# Patient Record
Sex: Female | Born: 1950 | Race: White | Hispanic: No | Marital: Married | Smoking: Former smoker
Health system: Southern US, Community
[De-identification: ages and names within clinical notes are randomized; demographics above are authoritative.]

## PROBLEM LIST (undated history)

## (undated) DIAGNOSIS — F419 Anxiety disorder, unspecified: Secondary | ICD-10-CM

## (undated) DIAGNOSIS — Z87442 Personal history of urinary calculi: Secondary | ICD-10-CM

## (undated) DIAGNOSIS — T8859XA Other complications of anesthesia, initial encounter: Secondary | ICD-10-CM

## (undated) DIAGNOSIS — IMO0001 Reserved for inherently not codable concepts without codable children: Secondary | ICD-10-CM

## (undated) DIAGNOSIS — E7800 Pure hypercholesterolemia, unspecified: Secondary | ICD-10-CM

## (undated) DIAGNOSIS — Z96659 Presence of unspecified artificial knee joint: Secondary | ICD-10-CM

## (undated) DIAGNOSIS — I10 Essential (primary) hypertension: Secondary | ICD-10-CM

## (undated) DIAGNOSIS — M1990 Unspecified osteoarthritis, unspecified site: Secondary | ICD-10-CM

## (undated) DIAGNOSIS — T4145XA Adverse effect of unspecified anesthetic, initial encounter: Secondary | ICD-10-CM

## (undated) DIAGNOSIS — G5603 Carpal tunnel syndrome, bilateral upper limbs: Secondary | ICD-10-CM

## (undated) HISTORY — DX: Pure hypercholesterolemia, unspecified: E78.00

## (undated) HISTORY — PX: APPENDECTOMY: SHX54

## (undated) HISTORY — PX: BREAST CYST EXCISION: SHX579

## (undated) HISTORY — PX: KNEE SURGERY: SHX244

## (undated) HISTORY — PX: LAMINECTOMY: SHX219

## (undated) HISTORY — DX: Unspecified osteoarthritis, unspecified site: M19.90

## (undated) SURGERY — CARPAL TUNNEL RELEASE
Anesthesia: Choice | Laterality: Left

---

## 2002-04-03 ENCOUNTER — Ambulatory Visit (HOSPITAL_COMMUNITY): Admission: RE | Admit: 2002-04-03 | Discharge: 2002-04-03 | Payer: Self-pay | Admitting: Obstetrics and Gynecology

## 2002-04-03 ENCOUNTER — Encounter: Payer: Self-pay | Admitting: Obstetrics and Gynecology

## 2003-03-28 ENCOUNTER — Emergency Department (HOSPITAL_COMMUNITY): Admission: EM | Admit: 2003-03-28 | Discharge: 2003-03-29 | Payer: Self-pay | Admitting: Emergency Medicine

## 2003-03-28 ENCOUNTER — Encounter: Payer: Self-pay | Admitting: Emergency Medicine

## 2003-04-07 ENCOUNTER — Inpatient Hospital Stay (HOSPITAL_COMMUNITY): Admission: AD | Admit: 2003-04-07 | Discharge: 2003-04-11 | Payer: Self-pay | Admitting: Family Medicine

## 2003-07-04 ENCOUNTER — Ambulatory Visit (HOSPITAL_COMMUNITY): Admission: RE | Admit: 2003-07-04 | Discharge: 2003-07-04 | Payer: Self-pay | Admitting: Obstetrics and Gynecology

## 2003-07-04 ENCOUNTER — Ambulatory Visit (HOSPITAL_COMMUNITY): Admission: RE | Admit: 2003-07-04 | Discharge: 2003-07-04 | Payer: Self-pay | Admitting: Family Medicine

## 2004-01-05 ENCOUNTER — Other Ambulatory Visit: Admission: RE | Admit: 2004-01-05 | Discharge: 2004-01-05 | Payer: Self-pay | Admitting: Obstetrics and Gynecology

## 2005-01-11 ENCOUNTER — Emergency Department (HOSPITAL_COMMUNITY): Admission: EM | Admit: 2005-01-11 | Discharge: 2005-01-11 | Payer: Self-pay | Admitting: Emergency Medicine

## 2005-04-25 ENCOUNTER — Ambulatory Visit (HOSPITAL_COMMUNITY): Admission: RE | Admit: 2005-04-25 | Discharge: 2005-04-25 | Payer: Self-pay | Admitting: Obstetrics and Gynecology

## 2005-07-26 ENCOUNTER — Ambulatory Visit (HOSPITAL_COMMUNITY): Admission: RE | Admit: 2005-07-26 | Discharge: 2005-07-26 | Payer: Self-pay | Admitting: Family Medicine

## 2005-09-12 ENCOUNTER — Encounter (HOSPITAL_COMMUNITY): Admission: RE | Admit: 2005-09-12 | Discharge: 2005-10-12 | Payer: Self-pay | Admitting: Neurosurgery

## 2005-09-12 ENCOUNTER — Ambulatory Visit: Payer: Self-pay | Admitting: Orthopedic Surgery

## 2005-10-13 ENCOUNTER — Encounter (HOSPITAL_COMMUNITY): Admission: RE | Admit: 2005-10-13 | Discharge: 2005-11-12 | Payer: Self-pay | Admitting: Neurosurgery

## 2006-04-27 ENCOUNTER — Ambulatory Visit (HOSPITAL_COMMUNITY): Admission: RE | Admit: 2006-04-27 | Discharge: 2006-04-27 | Payer: Self-pay | Admitting: Obstetrics and Gynecology

## 2006-07-25 HISTORY — PX: JOINT REPLACEMENT: SHX530

## 2007-01-03 ENCOUNTER — Ambulatory Visit: Payer: Self-pay | Admitting: Orthopedic Surgery

## 2007-01-12 ENCOUNTER — Ambulatory Visit (HOSPITAL_COMMUNITY): Admission: RE | Admit: 2007-01-12 | Discharge: 2007-01-12 | Payer: Self-pay | Admitting: Orthopedic Surgery

## 2007-01-18 ENCOUNTER — Ambulatory Visit: Payer: Self-pay | Admitting: Orthopedic Surgery

## 2007-01-22 ENCOUNTER — Encounter: Admission: RE | Admit: 2007-01-22 | Discharge: 2007-01-22 | Payer: Self-pay | Admitting: Orthopedic Surgery

## 2007-02-05 ENCOUNTER — Ambulatory Visit: Payer: Self-pay | Admitting: Orthopedic Surgery

## 2007-02-13 ENCOUNTER — Encounter: Admission: RE | Admit: 2007-02-13 | Discharge: 2007-02-13 | Payer: Self-pay | Admitting: Orthopedic Surgery

## 2007-02-27 ENCOUNTER — Encounter: Admission: RE | Admit: 2007-02-27 | Discharge: 2007-02-27 | Payer: Self-pay | Admitting: Orthopedic Surgery

## 2007-03-13 ENCOUNTER — Inpatient Hospital Stay (HOSPITAL_COMMUNITY): Admission: RE | Admit: 2007-03-13 | Discharge: 2007-03-16 | Payer: Self-pay | Admitting: Orthopedic Surgery

## 2007-03-13 ENCOUNTER — Ambulatory Visit: Payer: Self-pay | Admitting: Orthopedic Surgery

## 2007-03-13 ENCOUNTER — Encounter: Payer: Self-pay | Admitting: Orthopedic Surgery

## 2007-03-27 ENCOUNTER — Ambulatory Visit: Payer: Self-pay | Admitting: Orthopedic Surgery

## 2007-04-30 ENCOUNTER — Encounter (HOSPITAL_COMMUNITY): Admission: RE | Admit: 2007-04-30 | Discharge: 2007-05-30 | Payer: Self-pay | Admitting: Orthopedic Surgery

## 2007-05-01 ENCOUNTER — Ambulatory Visit (HOSPITAL_COMMUNITY): Admission: RE | Admit: 2007-05-01 | Discharge: 2007-05-01 | Payer: Self-pay | Admitting: Obstetrics and Gynecology

## 2007-05-03 ENCOUNTER — Ambulatory Visit: Payer: Self-pay | Admitting: Orthopedic Surgery

## 2007-05-03 DIAGNOSIS — M1710 Unilateral primary osteoarthritis, unspecified knee: Secondary | ICD-10-CM

## 2007-05-11 ENCOUNTER — Other Ambulatory Visit: Admission: RE | Admit: 2007-05-11 | Discharge: 2007-05-11 | Payer: Self-pay | Admitting: Obstetrics and Gynecology

## 2007-05-30 ENCOUNTER — Encounter: Payer: Self-pay | Admitting: Orthopedic Surgery

## 2007-05-31 ENCOUNTER — Ambulatory Visit: Payer: Self-pay | Admitting: Orthopedic Surgery

## 2007-05-31 DIAGNOSIS — M25569 Pain in unspecified knee: Secondary | ICD-10-CM

## 2007-06-01 ENCOUNTER — Encounter (HOSPITAL_COMMUNITY): Admission: RE | Admit: 2007-06-01 | Discharge: 2007-07-01 | Payer: Self-pay | Admitting: Orthopedic Surgery

## 2007-06-20 ENCOUNTER — Encounter: Payer: Self-pay | Admitting: Orthopedic Surgery

## 2007-07-05 ENCOUNTER — Encounter: Payer: Self-pay | Admitting: Orthopedic Surgery

## 2007-07-10 ENCOUNTER — Ambulatory Visit: Payer: Self-pay | Admitting: Orthopedic Surgery

## 2007-07-10 ENCOUNTER — Inpatient Hospital Stay (HOSPITAL_COMMUNITY): Admission: RE | Admit: 2007-07-10 | Discharge: 2007-07-13 | Payer: Self-pay | Admitting: Orthopedic Surgery

## 2007-07-13 ENCOUNTER — Encounter: Payer: Self-pay | Admitting: Orthopedic Surgery

## 2007-07-16 ENCOUNTER — Encounter: Payer: Self-pay | Admitting: Orthopedic Surgery

## 2007-07-23 ENCOUNTER — Telehealth: Payer: Self-pay | Admitting: Orthopedic Surgery

## 2007-07-23 ENCOUNTER — Encounter: Payer: Self-pay | Admitting: Orthopedic Surgery

## 2007-07-30 ENCOUNTER — Ambulatory Visit: Payer: Self-pay | Admitting: Orthopedic Surgery

## 2007-08-01 ENCOUNTER — Encounter (HOSPITAL_COMMUNITY): Admission: RE | Admit: 2007-08-01 | Discharge: 2007-08-31 | Payer: Self-pay | Admitting: Orthopedic Surgery

## 2007-08-02 ENCOUNTER — Encounter: Payer: Self-pay | Admitting: Orthopedic Surgery

## 2007-08-06 ENCOUNTER — Encounter: Payer: Self-pay | Admitting: Orthopedic Surgery

## 2007-08-23 ENCOUNTER — Encounter: Payer: Self-pay | Admitting: Orthopedic Surgery

## 2007-08-27 ENCOUNTER — Encounter: Payer: Self-pay | Admitting: Orthopedic Surgery

## 2007-08-30 ENCOUNTER — Ambulatory Visit: Payer: Self-pay | Admitting: Orthopedic Surgery

## 2007-09-01 ENCOUNTER — Encounter: Payer: Self-pay | Admitting: Orthopedic Surgery

## 2007-09-03 ENCOUNTER — Encounter (HOSPITAL_COMMUNITY): Admission: RE | Admit: 2007-09-03 | Discharge: 2007-10-03 | Payer: Self-pay | Admitting: Orthopedic Surgery

## 2007-09-12 ENCOUNTER — Encounter: Payer: Self-pay | Admitting: Orthopedic Surgery

## 2007-10-11 ENCOUNTER — Ambulatory Visit: Payer: Self-pay | Admitting: Orthopedic Surgery

## 2007-10-11 DIAGNOSIS — M7580 Other shoulder lesions, unspecified shoulder: Secondary | ICD-10-CM

## 2008-01-18 ENCOUNTER — Encounter: Payer: Self-pay | Admitting: Orthopedic Surgery

## 2008-03-19 ENCOUNTER — Ambulatory Visit: Payer: Self-pay | Admitting: Orthopedic Surgery

## 2008-05-02 ENCOUNTER — Ambulatory Visit (HOSPITAL_COMMUNITY): Admission: RE | Admit: 2008-05-02 | Discharge: 2008-05-02 | Payer: Self-pay | Admitting: Obstetrics and Gynecology

## 2008-05-23 ENCOUNTER — Other Ambulatory Visit: Admission: RE | Admit: 2008-05-23 | Discharge: 2008-05-23 | Payer: Self-pay | Admitting: Obstetrics and Gynecology

## 2008-07-14 ENCOUNTER — Ambulatory Visit: Payer: Self-pay | Admitting: Orthopedic Surgery

## 2009-01-07 ENCOUNTER — Ambulatory Visit: Payer: Self-pay | Admitting: Orthopedic Surgery

## 2009-05-04 ENCOUNTER — Ambulatory Visit (HOSPITAL_COMMUNITY): Admission: RE | Admit: 2009-05-04 | Discharge: 2009-05-04 | Payer: Self-pay | Admitting: Obstetrics and Gynecology

## 2009-07-08 ENCOUNTER — Ambulatory Visit: Payer: Self-pay | Admitting: Orthopedic Surgery

## 2009-09-25 ENCOUNTER — Other Ambulatory Visit: Admission: RE | Admit: 2009-09-25 | Discharge: 2009-09-25 | Payer: Self-pay | Admitting: Obstetrics and Gynecology

## 2010-05-07 ENCOUNTER — Ambulatory Visit (HOSPITAL_COMMUNITY): Admission: RE | Admit: 2010-05-07 | Discharge: 2010-05-07 | Payer: Self-pay | Admitting: Obstetrics and Gynecology

## 2010-08-04 ENCOUNTER — Ambulatory Visit
Admission: RE | Admit: 2010-08-04 | Discharge: 2010-08-04 | Payer: Self-pay | Source: Home / Self Care | Attending: Orthopedic Surgery | Admitting: Orthopedic Surgery

## 2010-08-04 DIAGNOSIS — Z96659 Presence of unspecified artificial knee joint: Secondary | ICD-10-CM | POA: Insufficient documentation

## 2010-08-26 NOTE — Assessment & Plan Note (Signed)
Summary: yrly xr both knees TKA/mcr/bsf   Visit Type:  Follow-up  CC:  recheck bilateral knee TKA's.  History of Present Illness: I saw [name] in the office today for a  followup visit.  She is a 60 years old woman with the complaint of:  Yearly recheck with xrays bilateral knees.  LEFT TKA 08/08.  RIGHT  TKA 12/08  s/p bilateral total knees, here for her yearly followup radiographs at 5 years. Her legs do feel heavy at times, but she is doing very well. Only has difficulty with changes in the weather.  No pain med needed.  System review: Chest pain; History of cardiac disease. Advised his cardiologist immediately      Allergies: No Known Drug Allergies  Physical Exam  Additional Exam:  Gen. exam normal. Patient maintaining excellent body habitus, and body weight.  She is awake, alert, and oriented x3. Mood and affect are normal. She walks with an unsupported gait.  BILATERAL KNEE EXAM   FLEXION = 120 DEGREES IN BOTH SHE HAS FULLE XTENSION  BOTH ARE STABLE    Impression & Recommendations:  Problem # 1:  OSTEOARTHRITIS, LOWER LEG (ICD-715.16) Assessment Comment Only Separate and Identifiable X-Ray report      Followup films for bilateral knee replacements.  RIGHT knee The  alignment was normal, PTF reduced without tilt or subluxation, no evidence of loosening.  IMPRESSION: normal appearance of the implant   LEFT knee The  alignment was normal, PTF reduced without tilt or subluxation, no evidence of loosening.  IMPRESSION: normal appearance of the implant   Problem # 2:  TOTAL KNEE FOLLOW-UP (ICD-V43.65) Assessment: Comment Only  Orders: Est. Patient Level III (47425) Knees  x-ray bilateral ([id number])  Medications Added to Medication List This Visit: 1)  Simvastatin 40 Mg Tabs (Simvastatin)  Patient Instructions: 1)  Please schedule a follow-up appointment in 1 year. 2)  BILATERAL TKA XRAYS    Orders Added: 1)  Est. Patient Level III  [43329] 2)  Knees  x-ray bilateral [[id number]]

## 2010-09-01 ENCOUNTER — Ambulatory Visit (HOSPITAL_COMMUNITY)
Admission: RE | Admit: 2010-09-01 | Discharge: 2010-09-01 | Disposition: A | Discharge status: Home / Self Care | Payer: Medicare Other | Source: Ambulatory Visit | Attending: Family Medicine | Admitting: Family Medicine

## 2010-09-01 ENCOUNTER — Other Ambulatory Visit (HOSPITAL_COMMUNITY): Payer: Self-pay | Admitting: Family Medicine

## 2010-09-01 DIAGNOSIS — F17200 Nicotine dependence, unspecified, uncomplicated: Secondary | ICD-10-CM

## 2010-09-01 DIAGNOSIS — M542 Cervicalgia: Secondary | ICD-10-CM | POA: Insufficient documentation

## 2010-09-01 DIAGNOSIS — M25519 Pain in unspecified shoulder: Secondary | ICD-10-CM

## 2010-09-01 DIAGNOSIS — M25551 Pain in right hip: Secondary | ICD-10-CM

## 2010-09-01 DIAGNOSIS — M47812 Spondylosis without myelopathy or radiculopathy, cervical region: Secondary | ICD-10-CM | POA: Insufficient documentation

## 2010-09-01 DIAGNOSIS — M25559 Pain in unspecified hip: Secondary | ICD-10-CM | POA: Insufficient documentation

## 2010-09-01 DIAGNOSIS — R0789 Other chest pain: Secondary | ICD-10-CM | POA: Insufficient documentation

## 2010-12-07 NOTE — Op Note (Signed)
[name], [name]               ACCOUNT NO.:  [id number]   MEDICAL RECORD NO.:  [id number]          PATIENT TYPE:  INP   LOCATION:  A302                          FACILITY:  [facility]   PHYSICIAN:  Vickki Hearing, M.D.DATE OF BIRTH:  [birth year]   DATE OF PROCEDURE:  07/10/2007  DATE OF DISCHARGE:                               OPERATIVE REPORT   HISTORY:  A 60 year old female with end-stage osteoarthritis, right  knee; symptomatic medial compartment; uncontrolled pain despite  nonoperative treatment.  X-rays and physical exam and history matched,  and the patient requested total knee replacement on the right status  post total knee replacement on the left.   PREOPERATIVE DIAGNOSIS:  Osteoarthritis, right knee.   POSTOPERATIVE DIAGNOSIS:  Osteoarthritis, right knee.   PROCEDURE:  Right total knee arthroplasty.   IMPLANT:  DePuy size 4 femur, size 3 tibia, size 12 posterior stabilized  cross-link polyethylene insert, size 38 patella (Sigma posterior  stabilized fixed bearing implant).   SURGEON:  Dr. Romeo Apple assisted by Valetta Close.   ANESTHETIC:  Spinal.   OPERATIVE FINDINGS:  The medial compartment was the compartment  primarily involved in the disease.  Medial femoral condyle had complete  loss of cartilage.  There was varus deformity and slight flexion  contracture.   Procedure was done as follows.  The patient was first identified in the  preoperative holding area as [name].  Her right knee had was  marked by her as the surgical site, and then I countersigned it.  The  history and physical was reviewed, the consent was reviewed and the  physical exam update was completed.   The patient was given spinal anesthetic in the operating suite, and  antibiotics were then started.  Catheter was inserted, tourniquet was  placed on the right proximal thigh, the knee was prepped with DuraPrep  and then draped sterilely.  The time-out procedure was completed, and  the  procedure was confirmed on the on the right knee.  X-rays were in  the room and plans confirmed as well.   The tourniquet was inflated after exsanguination of the limb with a 6-  inch Esmarch with the knee in flexion.  With the knee in flexion, a  straight midline incision was done.  Subcutaneous tissue was divided.  The extensor mechanism was exposed.  The medial arthrotomy was  performed.  Soft tissue dissection was carried out medially to the mid  coronal plane of the tibia.  The ACL, PCL, medial and lateral menisci  were resected.  The patellofemoral ligament was released.  The tibia was  subluxated forward, and the tibial guide was set for neutral cut.  We  referenced a lateral tibial plateau for an 10-mm cut, and this was  performed with an oscillating saw.  The remaining tibia was measured to  be a size 3 tibia.   A drill bit was then introduced into the intramedullary canal of the  femur, and then suction and irrigation were used to remove fatty and  bloody tissue from the canal.  An intramedullary guide system was placed  and the femoral  canal was set for a 5-degree right cut.  We used the 10-  mm resection setting and pinned the distal femoral cutting block in  place.  We removed 10 mm of bone, checked the cut for flatness and then  measured the extension gap.  A 10 and 12.5 extension block was tested.  The 12.5 seemed to fit best.   The femur was then measured to a size 4, was set for 3 degrees external  rotation.  The 4 cuts were then made.  The flexion gap was checked.  The  12.5 block fit well, and we proceeded to make the box cut.  After the  box cut was made, we sized the patella and cut it from a 21 to a 12 and  sized it for a 8.5-mm thickness patella.  We drilled the 3 peg holes.   We then did a trial reduction with the implants in place, 12 posterior  stabilized polyethylene tray.  We obtained excellent tracking, alignment  and ligament stability in the AP and  coronal planes.   We set the rotation of the tibia and then made our initial drill hole in  the canal and then punched the proximal tibia.   We then irrigated the joint, dried the bone, cemented our implants in  place.  The cement cured, the excess cement was removed, 12.5 posterior  stabilized trial tray was placed.  The knee was taken through a range of  motion, and it was found to be stable with good patella tracking.  The  trial was removed.  The tray liner was removed, and a 12 posterior  stabilized cross-link polyethylene was inserted and secured.   The wounds were irrigated again.  The femur was reduced onto the tibia.  We injected 30 mL of Marcaine with epinephrine into the soft tissues of  the joint.  We closed the knee in flexion with #1 interrupted and  running Bralon suture.   We then inserted a pain pump catheter in the subcutaneous space and  reapproximated the subcutaneous tissue with 0 Monocryl.  Skin staples  were applied to reapproximate the skin.  A sterile dressing was applied.  The pain pump catheter was activated.  CryoCuff was applied over Ace  bandages which were applied from the foot to the groin, and the patient  was taken to the recovery room in stable condition where she was placed  in a CPM machine, and intraoperative x-ray confirmed alignment of the  prosthesis, and there were no complicating features.      Vickki Hearing, M.D.  Electronically Signed     SEH/MEDQ  D:  07/10/2007  T:  07/10/2007  Job:  [id number]

## 2010-12-07 NOTE — Discharge Summary (Signed)
NAMEATIANA, LEVIER               ACCOUNT NO.:  [id number]   MEDICAL RECORD NO.:  [id number]          PATIENT TYPE:  INP   LOCATION:  A304                          FACILITY:  [facility]   PHYSICIAN:  Vickki Hearing, M.D.DATE OF BIRTH:  04-04-1951   DATE OF ADMISSION:  03/13/2007  DATE OF DISCHARGE:  08/22/2008LH                               DISCHARGE SUMMARY   ADMITTING DIAGNOSES:  1. Osteoarthritis, left knee.  2. Comorbidities.  3. Degenerative disk disease, lumbar spine.  4. Nonsurgical herniated disk, L4-L5.   MEDICAL HISTORY:  This is a 60 year old female who had increasing left  knee pain over a year.  Pain became intolerable and was unrelieved by  Lorcet Plus, 1/2 tablet four times a day.  She was treated for back pain  with epidural injection, which did not relieve any of her knee pain.  She was evaluated by Dr. Newell Coral, who did not think her L4-5 disk  needed any surgery.  After informed consent, process completed in the  office.  The patient opted for surgical treatment with a total knee  replacement.   She was admitted on March 13, 2007.  She underwent an uncomplicated  left total knee replacement, with a DePuy cruciate substituting, fixed  bearing PFC Sigma knee replacement.  We used a size 3 femur, size 35-mm  patella, size 3 tibia, 15-mm cross-link stabilized insert and Smart set  bone cement.   The patient did well in therapy postoperatively, had no complications.   OPERATIVE FINDINGS:  There was complete loss of cartilage from the  medial compartment with mild varus deformity, mild flexion contracture  less than 5 degrees.  There was a single chondral lesion on the lateral  condyle, and there was a full-thickness lesion on the patella.   Past specimen came back as degenerated articular cartilage, with  findings consistent with osteoarthritis.   LABORATORY STUDIES:  Hemoglobin at discharge was 11.5, and her metabolic  7 was normal, with a Glucose of  111.   DISCHARGE MEDICATIONS:  She was discharged on following medications:  1. Coumadin 5 mg daily, 8 days.  2. Robaxin 500 mg q.8 p.r.n.  3. Percocet 5/325 q.4 p.r.n.  4. She was to resume Xanax 1 mg b.i.d.  5. Femhrt 0.5 mg daily.   Follow-up visit was arranged, to take staples out within the next week.  Overall condition at discharge stable.      Vickki Hearing, M.D.  Electronically Signed     SEH/MEDQ  D:  04/17/2007  T:  04/17/2007  Job:  [id number]

## 2010-12-07 NOTE — Op Note (Signed)
[name], [name]               ACCOUNT NO.:  [id number]   MEDICAL RECORD NO.:  [id number]          PATIENT TYPE:  INP   LOCATION:  A304                          FACILITY:  [facility]   PHYSICIAN:  Vickki Hearing, M.D.DATE OF BIRTH:  [birth year]   DATE OF PROCEDURE:  03/13/2007  DATE OF DISCHARGE:                               OPERATIVE REPORT   PREOPERATIVE DIAGNOSIS:  Osteoarthritis left knee.   POSTOPERATIVE DIAGNOSIS:  Osteoarthritis left knee.   PROCEDURE:  Left total knee replacement.   IMPLANTS:  DePuy fixed bearing total knee and 3 femur, 3 tibia 15  polyethylene fixed bearing insert, 35 mm patella.   OPERATIVE FINDINGS:  Complete loss of cartilage from the medial  compartment, mild varus deformity, mild flexion contracture less than 5  degrees each, single chondral lesion full-thickness lateral femoral  condyle and mid patella full thickness lesion was noted.   SURGEON:  Vickki Hearing, M.D.   ASSISTANT:  Deniece Portela McFatter and [employer].   ANESTHETIC:  Spinal.   BLOOD LOSS:  None.  The surgery was done under tourniquet control at 300  mmHg.  Time found in anesthesia report.   The patient was identified as [name].  We marked her left knee as  the surgical site.  I countersigned it, updated her history and  physical, took her back to surgery where she had spinal anesthetic.  Antibiotics were started.  She was placed supine on the operating table.  Foley catheter was inserted.  The left leg was prepped with DuraPrep and  drops or draped sterilely.  Time-out procedure was completed, procedure  confirmed, patient identity confirmed, site surgery confirmed.   The tourniquet was elevated after exsanguination of the limb.  Straight  incision was made over the patella, extended proximally and distally  down to the extensor mechanism, medial arthrotomy was performed, patella  was everted, patellofemoral ligament was released, medial soft tissue  sleeve was  released up to the level of the mid coronal plane of the  tibia.  ACL, PCL, medial and lateral menisci were resected.  Tibia was  subluxated forward.  Tibial guide was set for neutral position with 0  posterior slope.  We referenced from the lateral side, a 10 mm cut, made  the cut, measured the tibia to a size 3.   We drilled the center of the trochlea into the femoral canal,  decompressed it with suction and irrigation, set the distal femoral cut  for a 10 mm cut, made this cut, sized the femur to 3.5, set it for a 3,  cut for a 3, with the four-in-one cutting block.  We measured the  flexion/extension gaps, found them to be approximately 12.5 and gaps  were equal.  Femoral cuts were made with 3 degree external rotation.   After doing a trial with a 3 femur, 3 tibia, 12.5 insert, lug holes were  drilled.  The patella was cut from a 22 down to a 13.  Three peg holes  for the patellar insert using a 35 template were made.  Another trial  reduction was done.  No lateral release was needed.  Tibial rotation was  set.  Tibial punches were made.  Knee was then irrigated and the  components cemented in place.  Excess cement was removed.  Cement was  allowed to cure.  The 12.5 and 15 mm inserts were trialed, I liked the  15 best.  We put the 15 polyethylene insert in.  We checked the knee.  We had full extension and 115 degrees of flexion with no assistants,  i.e., passive flexion test and a 125 degrees with surgeon pressure on  the knee.   The knee was irrigated, closed after injection of 30 mL of Sensorcaine  with epinephrine.  We used #1 Bralon to close in interrupted and running  fashion.  We used 2-0 and 0 to close the subcu layer over a pain pump  catheter which was activated.  Staples were used to close the skin.  Sterile dressings and cryo cuff.  The patient taken to recovery room,  placed in CPM machine in stable condition.  Intraoperative film was  taken just after closure.  Film  showed good alignment, no complicating  features.   POSTOPERATIVE PLAN:  The patient will have routine postop total knee  protocol with Coumadin as a DVT prevention, immediate CPM.      Vickki Hearing, M.D.  Electronically Signed     SEH/MEDQ  D:  03/13/2007  T:  03/13/2007  Job:  [id number]

## 2010-12-07 NOTE — Discharge Summary (Signed)
NAMEKIMMERLY, [name]               ACCOUNT NO.:  [id number]   MEDICAL RECORD NO.:  [id number]          PATIENT TYPE:  INP   LOCATION:  A302                          FACILITY:  [facility]   PHYSICIAN:  Vickki Hearing, M.D.DATE OF BIRTH:  [birth year]   DATE OF ADMISSION:  07/10/2007  DATE OF DISCHARGE:  12/19/2008LH                               DISCHARGE SUMMARY   ADMITTING DIAGNOSIS:  Pain, right knee with osteoarthritis.   DISCHARGE DIAGNOSES:  Pain, right knee with osteoarthritis.   OPERATIVE PROCEDURE:  Right total knee arthroplasty.   OPERATIVE SURGEON:  Dr. Romeo Apple.   DISCHARGE MEDICATIONS:  1. Coumadin 3 mg daily for 7 days.  2. Robaxin 500 mg p.o. q.6 p.r.n. spasms.  3. Fem-Heart daily, resume pre-hospital dose.  4. Oxycodone 5/325, one q.4 p.r.n. for pain.  S  5. Simvastatin 20 mg h.s.  6. Xanax 0.5 mg at q.h.s. as needed.  7. Celebrex 200 mg a day, to start on Wednesday after discharge.   HISTORY:  A 60 year old female, status post left total knee had  bilateral osteoarthritis with matching x-rays, presented for right total  knee replacement due to end-stage osteoarthritis and uncontrollable  pain.   The patient was admitted as stated, did well during the surgical  procedure, as well as the post-op period.   She had a PFC Sigma across link stabilized, posterior stabilized 12.5-mm  insert, cruciate substituting femoral component size 4, right; A PFC  Sigma modular tibial tray cemented, smart set HV bone cement, a striker  pain pump, and an oval dome patella, size 38.   HOSPITAL COURSE:  Initially, we has some trouble controlling her pain,  eventually managed that well with Tylox, Robaxin, and PCA.  She did well  with her therapeutic exercises, including a CPM machine.  She walked  over 300 feet, had a range of motion at discharge of 8 to 93 degrees  active assisted.   DISCHARGE INSTRUCTIONS:  Full weightbearing.  She is to return to the  office for evaluation  within the next 2 weeks.  She should have a CPM  machine at home, home physical therapy and continue her post-op total  knee protocol.      Vickki Hearing, M.D.  Electronically Signed     SEH/MEDQ  D:  07/25/2007  T:  07/25/2007  Job:  [id number]

## 2010-12-07 NOTE — H&P (Signed)
NAMESHYE, DOTY               ACCOUNT NO.:  [id number]   MEDICAL RECORD NO.:  [id number]          PATIENT TYPE:  AMB   LOCATION:  DAY                           FACILITY:  [facility]   PHYSICIAN:  Vickki Hearing, M.D.DATE OF BIRTH:  [birth year]   DATE OF ADMISSION:  07/10/2007  DATE OF DISCHARGE:  LH                              HISTORY & PHYSICAL   CHIEF COMPLAINT:  Right knee pain.   She is a 60 year old female with chronic right knee pain consistent with  osteoarthritis with matching x-rays.  She complains of uncontrollable  pain, loss of ability to perform activities of daily living and wishes  to get a second knee replacement.  First one was on the left and that  was in August.  She has done well.  She has underlying L5-S1 disk  disease, status post diskectomy.  She was followed by Dr. Newell Coral in  2007 with MRI.  Surgery was not recommended for L4-5 protruding disk  abutting the L5 root bilaterally.   She indicates she cannot work because of her knee and back problems.   REVIEW OF SYSTEMS:  Weight loss on purpose with good dieting.  Fatigue,  chest pain, difficulty breathing, cough, reflux, kidney stone, headache,  numbness, unsteady gait, joint pain and swelling, depression, mood  swing, anxiety, panic attack and poor vision.  Denied problems in the  endocrine, skin, and immunologic system.   ALLERGIES:  No allergies.   PAST HISTORY:  As above.   PAST SURGICAL HISTORY:  She had a lumpectomy, she has had back surgery.   MEDICAL PROBLEMS:  Stress, upper respiratory disease, knee pain, back  pain.   MEDICATIONS:  Alprazolam, Chantix, Vicodin ES, fem HRT.   FAMILY HISTORY:  Heart disease, cancer, diabetes and I have done a knee  replacement on her mother.   SOCIAL HISTORY:  She is a married housewife.  Her daughter is a Engineer, civil (consulting).  She smokes but is trying to quit, hence the Chantix.  Alcohol use very  little. Caffeine use very little.  Highest grade completed was  12.   VITAL SIGNS:  Weight 178, pulse 70, respiratory rate 18.  APPEARANCE:  Normal body habitus, no deformities, normal developmental  status.  CARDIOVASCULAR:  Normal pulses and perfusion, normal temperature, no  edema or tenderness, no swelling or varicose veins.  NECK:  Showed normal lymph system.  Axilla lymph system.  NEUROPSYCHIATRIC:  Exam shows she was alert and oriented x3, she was in  a pleasant mood.  Reflexes were intact.  Sensation was normal.  She had  good coordination.  MUSCULOSKELETAL:  Exam shows normal upper extremities in terms of range  of motion, strength, stability and alignment.  There was no rigidity or  spasticity noted.   Her right knee is stable.  However a range of motion is approximately  115 degrees.  There is no collateral ligament instability.  There is  palpable tenderness along both joint lines.  There was no joint  effusion.  Muscle strength, tone, extensor mechanism is intact.   Informed consent process was done in the office.  The patient was again  reminded of the risks and benefits of total knee replacement surgery.  She understood these as she did before, bleeding, infection, loosening,  revision surgery, all possibilities.  DVT also a possibility. Infection  would require removal of prosthesis, antibiotics with a spacer and then  reimplantation with the risk of amputation.  DVT can lead to pulmonary  embolus and death, bleeding may require transfusion.   The patient understands she may have some pain after the procedure  especially in the setting of protruding disk in the L4-5 region.   She agreed to proceed with surgery on the right knee for right total  knee replacement.      Vickki Hearing, M.D.  Electronically Signed     SEH/MEDQ  D:  07/09/2007  T:  07/10/2007  Job:  [id number]

## 2010-12-07 NOTE — H&P (Signed)
NAMEMICHIAH, MASSE               ACCOUNT NO.:  [id number]   MEDICAL RECORD NO.:  [id number]          PATIENT TYPE:  AMB   LOCATION:  DAY                           FACILITY:  [facility]   PHYSICIAN:  Vickki Hearing, M.D.DATE OF BIRTH:  1950-12-25   DATE OF ADMISSION:  DATE OF DISCHARGE:  LH                              HISTORY & PHYSICAL   CHIEF COMPLAINT:  Left knee pain.   HISTORY:  A 60 year old female with increasing pain in the left knee  over 1 year. At its worst, it is an eight out of ten, unrelieved by  Lorcet Plus 1/2 tablet 4 times a day.  She actually was taking that for  her back.  She had one epidural injection which gave her relief of back  pain for about a week did not affect her knee pain at all. Her knee pain  is constant, associated with aching, throbbing, increased with standing  and walking, improved with rest.  The knee gives out and feels like it  is weak. There is no swelling in the knee.  She also takes Celebrex 200  mg b.i.d. which decreases was but does not completely relieve her pain   She is status post L5-S1 diskectomy by Dr. Merilynn Finland, was seen by Dr.  Newell Coral January 2007 with MRI of the L-spine, and no surgery was  recommended. On MRI, she does have multiple levels of disk disease with  facette hypertrophy and ligamentum flavum thickening, broad-based disk  herniation at L4-5 with protruding disk material abutting L5 root  bilaterally. Again, Dr. Newell Coral recommended no surgery.  She is getting  epidural #2 before knee surgery   She indicates she cannot work because of her knee and back problems.   REVIEW OF SYSTEMS:  History of weight loss on purpose with good dieting.  She has some fatigue, chest pain, difficulty breathing, cough, reflux,  kidney stone, headache, numbness, unsteady gait, joint pain, swelling,  depression, mood swing, anxiety, panic attack, poor vision. Denied  endocrine and skin disorders or immunologic system disorder.   PAST  HISTORY:  No allergies to medications.  She has had previous  surgery including lumpectomy, the back surgery as stated.   MEDICAL PROBLEMS:  Listed were:  1. Stress.  2. Upper respiratory disease.  3. Knee pain.  4. Back pain.   CURRENT MEDICATIONS:  1. Alprazolam.  2. Chantix.  3. Celebrex 200 mg b.i.d.  4. Hydrocodone ES 1/2 tablet q. 6 h.  5. Femhrt.   FAMILY HISTORY:  Heart disease, cancer, diabetes, arthritis.   SOCIAL HISTORY:  She is married, a housewife.  Is smoking and trying to  quit. Alcohol use:  Very little.  Caffeine use:  Very little. Highest  grade completed was 12.   PHYSICAL EXAMINATION:  VITAL SIGNS: 180 weight. Pulse 70, respiratory  rate 18.  GENERAL APPEARANCE:  Normal. Body habitus normal.  There are no  deformities. Development, status normal.  CARDIOVASCULAR:  No swelling or varicose veins.  Pulses normal.  Temperature normal.  No edema or tenderness.  LYMPHATICS/LYMPH NODES:  Neck normal.  Axillae normal.  MUSCULOSKELETAL:  Upper extremities inspection, range of motion  assessment normal.  Stability and strength assessments were normal.  Lower Extremities: No joint swelling, bilateral medial joint line  tenderness.  No meniscal signs. Has near full range of motion, no  contracture. Right knee stable. 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 Strength exam was normal  as it was in all four extremities.  SKIN:  No scars, rashes, lesions or ulcers on the head, neck, trunk or  upper or lower extremities.  NEUROPSYCH/PSYCHIATRIC TEST:  Showed she was alert and oriented x3,  became depressed when I told her of all the problems she had  orthopedically on the first visit, seemed to have pulled herself  together by the second visit on July 14.  Reflexes were 2+.  Sensation  normal. Coordination normal.   DATA REVIEW:  X-rays in my office [facility], lateral, patellofemoral view, left  knee medial gonarthrosis    IMPRESSION:  Osteoarthritis,. left knee.   SECONDARY DIAGNOSIS:  1. Degenerative disk disease of L-spine.  2. Nonsurgical herniated disk L4-5.   Surgery has been scheduled for August 19 with a followup visit scheduled  September 2.  Dr. John Giovanni is the primary care physician.      Vickki Hearing, M.D.  Electronically Signed     SEH/MEDQ  D:  02/05/2007  T:  02/05/2007  Job:  [id number]   cc:   Mila Homer. Sudie Bailey, M.D.  Fax: [phone]   [facility]  Fax: [phone]

## 2010-12-07 NOTE — H&P (Signed)
[name], [name]               ACCOUNT NO.:  [id number]   MEDICAL RECORD NO.:  [id number]          Marquez TYPE:  INP   LOCATION:  A304                          FACILITY:  [facility]   PHYSICIAN:  Vickki Hearing, M.D.DATE OF BIRTH:  1951-03-15   DATE OF ADMISSION:  03/12/2007  DATE OF DISCHARGE:  LH                              HISTORY & PHYSICAL   CHIEF COMPLAINT:  Left knee pain.   HISTORY:  A 60 year old female with increasing pain in the left knee  over 1 year.  At its worst, it is an 8/10, unrelieved by Lorcet Plus 1/2  tablet four times a day.  She actually was taking that for her back.  She had one epidural injection which gave her relief of back pain for  about a week.  It did not affect her knee pain at all.  Her knee pain is  constant, associated with aching throbbing, increased with standing and  walking, improved with rest.  The knee gives out and feels like it is  weak.  There is no swelling in the knee.  She also takes Celebrex 200 mg  b.i.d. which decreases her pain but does not completely relieve it.   She is status post L5-S1 diskectomy by Dr. Merilynn Finland, was seen by Dr.  Newell Coral in January of 2007 with MRI of the lumbar spine and no surgery  was recommended.  On MRI, she does have multiple levels of disk disease  with facette hypertrophy and ligamentum flavum thickening, broad-based  disk herniation L4-5 a protruding disk material abutting L5 root  bilaterally, but Dr. Newell Coral  recommended no surgical intervention.  She has gotten the second epidural on February 13, 2007.   She indicates she cannot work because of her knee and back problems.   REVIEW OF SYSTEMS:  History of weight loss on purpose with good dieting.  She has some fatigue, chest pain, difficulty breathing, cough, reflux,  kidney stone, headache, numbness, unsteady gait, joint pain, swelling,  depression, mood swing, anxiety, panic attack, poor vision.  Denied  endocrine, skin disorders or  immunologic system disorder.   PAST HISTORY:   ALLERGIES:  No allergies to medication.   PAST SURGICAL HISTORY:  1. She has had previous surgery including lumpectomy.  2. Back surgery as stated.   MEDICAL PROBLEMS:  The medications problems listed are:  1. Stress.  2. Upper respiratory disease.  3. Knee pain.  4. Back pain.   MEDICATIONS:  1. Alprazolam.  2. Chantix.  3. Celebrex.  4. Hydrocodone ES 1/2 tablet q.6h.  5. Femhrt.   FAMILY HISTORY:  Heart disease, cancer, diabetes, arthritis.   SOCIAL HISTORY:  She is married, a housewife.  Her daughter is a Engineer, civil (consulting).  She smokes; is trying to quit.  Alcohol use very little.  Caffeine use  very little.  Highest grade completed was 12.   PHYSICAL EXAMINATION:  VITAL SIGNS:  Weight 180, pulse 70, respiratory  rate 18.  GENERAL APPEARANCE:  Normal.  Body habitus normal.  There were no  deformities.  Development status normal.  CARDIOVASCULAR:  No swelling or varicose  veins.  Pulses normal.  Temperature normal.  No edema or tenderness.  LYMPHATICS:  Neck normal.  Axilla normal.  MUSCULOSKELETAL:  Upper extremities on inspection revealed normal range  of motion.  Appearance, stability and strength assessments normal.  Lower extremities:  No joint swelling, bilateral medial joint line  tenderness with negative meniscal signs.  Near full range of motion  without contracture.  Right knee stable.  Left knee has a hint of AP  instability.  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  Strength  exam was normal.  SKIN:  No scars, rashes, lesions, ulcers on the head and neck, trunk or  upper or lower extremities.  NEUROPSYCHIATRIC:  The neuropsychiatric tests showed she was alert and  oriented x3.  Became depressed when I told her of all the problems she  had orthopedically on the first visit.  Seemed to have pulled herself  together by the second visit on February 05, 2007, and was doing fine.  Reflexes were 2+.  Sensation was normal.   Coordination was normal.   DATA REVIEW:  X-rays in my office, AP lateral, patellofemoral view of  the left knee shows medial gonarthrosis.   IMPRESSION:  Osteoarthritis, left knee.   SECONDARY DIAGNOSES:  1. Degenerative disk disease, L-spine.  2. Nonsurgical herniated disk L4-5.   Surgery scheduled for March 13, 2007.  Follow-up visit scheduled  March 27, 2007.  Dr. John Giovanni is the primary care physician.      Vickki Hearing, M.D.  Electronically Signed     SEH/MEDQ  D:  03/12/2007  T:  03/12/2007  Job:  [id number]   cc:   [name]. [name], M.D.  Fax: [phone]

## 2010-12-10 NOTE — Cardiovascular Report (Signed)
   NAME:  [name], [name]                         ACCOUNT NO.:  [id number]   MEDICAL RECORD NO.:  [id number]                   PATIENT TYPE:  INP   LOCATION:  3731                                 FACILITY:  [facility]   PHYSICIAN:  Carole Binning, M.D. [facility]         DATE OF BIRTH:  [birth year]   DATE OF PROCEDURE:  04/11/2003  DATE OF DISCHARGE:  04/11/2003                              CARDIAC CATHETERIZATION   PROCEDURE PERFORMED:  Left heart catheterization with coronary angiography,  left ventriculography.   INDICATION:  [name] is a 60 year old woman admitted to [facility] with chest pain.  A stress Cardiolite scan was interpreted as  showing fixed inferior and apical defects consistent with possible prior  infarct.  She was therefore referred for cardiac catheterization to rule out  coronary artery disease.   PROCEDURAL NOTE:  A 6 French sheath was placed in the right femoral artery.  Coronary angiography was performed with 6 [place] JL-4 and JR-4 catheters.  Left ventriculography was performed with an angled pigtail catheter.  Contrast was Omnipaque.  There were no complications.   RESULTS:   HEMODYNAMICS:  1. Left ventricular pressure 130/20.  2. Aortic pressure 118/70.  3. There is no aortic valve gradient.   LEFT VENTRICULOGRAM:  Wall motion is normal.  Ejection fraction calculated  at 55-60%.  There is no mitral regurgitation.   CORONARY ARTERIOGRAPHY (RIGHT DOMINANT):  Left main is normal.   Left anterior descending artery gives rise to two normal size diagonal  branches.  The LAD itself is normal.   Left circumflex gives rise to large first and second marginal branches.  The  left circumflex is normal.   Right coronary artery has minor luminal irregularities in the proximal  vessel.  This artery is otherwise normal giving rise to a normal size  posterior descending artery and three small posterior lateral branches.    IMPRESSION:  1. Normal left  ventricular systolic function.  2. No significant coronary artery disease identified.   RECOMMENDATIONS:  Recommend the patient be evaluated for alternative  etiologies for her chest pain.                                                Carole Binning, M.D. [facility]    MWP/MEDQ  D:  04/11/2003  T:  04/12/2003  Job:  [id number]   cc:   Mila Homer. Sudie Bailey, M.D.  [address] [place], [place] [address]  Fax: [phone]   [place] Bing, M.D.

## 2010-12-10 NOTE — Group Therapy Note (Signed)
   NAME:  [name], [name]                         ACCOUNT NO.:  [id number]   MEDICAL RECORD NO.:  [id number]                   PATIENT TYPE:  INP   LOCATION:  A208                                 FACILITY:  [facility]   PHYSICIAN:  Angus G. Renard Matter, M.D.              DATE OF BIRTH:  07-25-1951   DATE OF PROCEDURE:  04/10/2003  DATE OF DISCHARGE:  04/10/2003                                   PROGRESS NOTE   SUBJECTIVE:  This patient was admitted with chest pain.  She was seen by  cardiology, apparently had stress test done yesterday.   OBJECTIVE:  Vital signs:  Blood pressure 108/66, respirations 20, pulse 67,  temperature 97.2.  Heart:  Regular rhythm.  Lungs:  Clear to P&A, diminished  breath sounds in upper and lower lobes.   ASSESSMENT:  The patient was admitted with chest pain.  She does have a  history of obesity, history of cigarette smoking.  Her cardiac enzymes have  remained normal.   PLAN:  Continue current regimen.                                               Angus G. Renard Matter, M.D.    AGM/MEDQ  D:  04/10/2003  T:  04/10/2003  Job:  [id number]

## 2010-12-10 NOTE — Procedures (Signed)
   NAME:  MARIAHA, ELLINGTON                         ACCOUNT NO.:  [id number]   MEDICAL RECORD NO.:  [id number]                   PATIENT TYPE:  INP   LOCATION:  A208                                 FACILITY:  [facility]   PHYSICIAN:  Vida Roller, M.D.                DATE OF BIRTH:  05/11/1951   DATE OF PROCEDURE:  04/09/2003  DATE OF DISCHARGE:                                    STRESS TEST   EXERCISE CARDIOLITE   INDICATION:  Ms. Bulls is a 60 year old female with no known coronary artery  disease who presented with atypical chest discomfort.  Her cardiac risk  factors include tobacco and sedentary lifestyle.  Her cardiac enzymes were  negative x3 for acute myocardial infarction.   BASELINE DATA:  EKG reveals sinus rhythm at 71 beats per minute with  nonspecific ST abnormalities.  Blood pressure was 148/88.   The patient exercised for a total of six minutes 25 seconds to Bruce  protocol stage 3 and 7.0 METs.  Maximum heart rate achieved was 149 beats  per minute which is 87% of predicted maximum.  Maximum blood pressure was  180/78.  EKG revealed no ischemic changes and no arrhythmias.  The patient  denied any symptoms during exercise.   Final images and results are pending M.D. review.     Amy Mercy Riding, P.A. [facility]                     Vida Roller, M.D.    AB/MEDQ  D:  04/09/2003  T:  04/09/2003  Job:  [id number]

## 2010-12-10 NOTE — Consult Note (Signed)
NAME:  [name], LEAVENS                         ACCOUNT NO.:  [id number]   MEDICAL RECORD NO.:  [id number]                   PATIENT TYPE:  INP   LOCATION:  A208                                 FACILITY:  [facility]   PHYSICIAN:  [place] Bing, M.D.               DATE OF BIRTH:  [birth year]   DATE OF CONSULTATION:  04/08/2003  DATE OF DISCHARGE:                                   CONSULTATION   REFERRING PHYSICIAN:  Mila Homer. Sudie Bailey, M.D.   HISTORY OF PRESENT ILLNESS:  This is a 60 year old woman admitted to [facility] with an episode of chest discomfort.  Ms. Llerenas first noted  chest pain approximately 10 days ago when she experienced an episode of  sharp, intense, lower substernal pain accompanied by dyspnea, nausea and  diaphoresis.  This lasted for approximately one hour at which time she  presented to the emergency department.  The discomfort awoke her from sleep.  There was no relationship to exertion or body position.  She was evaluated  and discharged from the ED.  She subsequently has had some chest discomfort,  but again, awoke with a fairly severe episode the early on morning of  admission.  There was associated dyspnea.  This was somewhat less intense  than the first occasion.  There was associated, but no marked diaphoresis.  The patient was brought to the emergency department and admitted.   She reports no prior cardiac history.  There has been no hypertension nor  diabetes.  She is unaware of cholesterol status.  She does have a 60-pack-  year history of cigarette smoking, but quit four years ago.  Lipid status is  unknown.   MEDICATIONS:  1. Estrogen replacement therapy.  2. Fluoxetine 20 mg q.d.  3. Ibuprofen 800 mg p.r.n.   PAST SURGICAL HISTORY:  1. Excisional biopsies for benign disease from both breasts.  2. Bilateral tubal ligation.  3. Appendectomy.   SOCIAL HISTORY:  She lives in [place] with her husband.  She is married with  three daughters.   She works as a Brewing technologist in a factory.  Denies  excessive use of alcohol.  No illicit drugs.   FAMILY HISTORY:  Father is 59 years old and was first diagnosed with  coronary disease in his 63s.  He has five siblings without known coronary  disease.   REVIEW OF SYMPTOMS:  CARDIOPULMONARY:  Notable for intermittent diaphoresis.  History of anxiety with possible hyperventilation episodes.  MUSCULOSKELETAL:  Some arthralgias in the right hip.  All other systems  negative.   PHYSICAL EXAMINATION:  GENERAL:  Pleasant, overweight woman.  VITAL SIGNS:  Temperature 97, heart rate 70 and regular, respirations 20,  blood pressure 120/70.  HEENT:  Anicteric sclerae.  NECK:  No jugular venous distention.  No carotid bruits.  No thyromegaly.  No adenopathy.  SKIN:  Small tattoo over right posterior shoulder.  CARDIAC:  Normal first and second heart sounds.  Fourth heart sound present.  LUNGS:  Clear.  ABDOMEN:  Soft and nontender, no bruits, no organomegaly.  EXTREMITIES:  Distal pulses intact, no edema.  NEUROLOGIC:  Symmetric strength and tone.   LABORATORY DATA AND X-RAY FINDINGS:  Chest x-ray not ordered.  EKG showed  normal sinus rhythm within normal limits.  Other laboratory studies are  normal including cardiac markers.  Lipid profile is pending.   IMPRESSION:  Ms. Figler has relatively modest cardiovascular risk factors,  but presents with quite worrisome symptoms.  Normal electrocardiogram and  cardiac markers are somewhat reassuring.   RECOMMENDATIONS:  The likelihood of coronary disease is relatively low.  Will proceed with a stress Cardiolite study to further exclude coronary  disease.  A D-dimer level will be obtained.  If the above studies are  negative, alternative etiologies for chest discomfort can be evaluated after  hospital discharge.                                               [place] Bing, M.D.    RR/MEDQ  D:  04/08/2003  T:  04/08/2003  Job:  [id number]

## 2011-04-05 ENCOUNTER — Other Ambulatory Visit: Payer: Self-pay | Admitting: Obstetrics and Gynecology

## 2011-04-05 DIAGNOSIS — Z139 Encounter for screening, unspecified: Secondary | ICD-10-CM

## 2011-04-29 LAB — CBC
HCT: 30.3 — ABNORMAL LOW
HCT: 31.9 — ABNORMAL LOW
Hemoglobin: 10 — ABNORMAL LOW
Hemoglobin: 10.5 — ABNORMAL LOW
Hemoglobin: 10.8 — ABNORMAL LOW
MCHC: 34.2
MCHC: 34.5
MCV: 92
MCV: 92.7
RBC: 3.15 — ABNORMAL LOW
RBC: 3.29 — ABNORMAL LOW
RBC: 3.44 — ABNORMAL LOW
RDW: 14
RDW: 14.1
RDW: 14.2

## 2011-04-29 LAB — DIFFERENTIAL
Basophils Absolute: 0
Basophils Absolute: 0
Basophils Absolute: 0.1
Basophils Relative: 0
Basophils Relative: 1
Eosinophils Absolute: 0.1 — ABNORMAL LOW
Eosinophils Relative: 1
Eosinophils Relative: 1
Lymphocytes Relative: 23
Lymphs Abs: 2.4
Monocytes Absolute: 0.8
Monocytes Absolute: 0.8
Monocytes Absolute: 1
Monocytes Relative: 7
Monocytes Relative: 7
Monocytes Relative: 9
Neutro Abs: 8.3 — ABNORMAL HIGH

## 2011-04-29 LAB — BASIC METABOLIC PANEL
CO2: 27
CO2: 28
Calcium: 8.8
Chloride: 104
Chloride: 107
Creatinine, Ser: 0.68
GFR calc non Af Amer: >60
Glucose, Bld: 101 — ABNORMAL HIGH
Glucose, Bld: 123 — ABNORMAL HIGH
Glucose, Bld: 155 — ABNORMAL HIGH
Potassium: 4
Potassium: 4.1
Sodium: 136
Sodium: 139
Sodium: 139

## 2011-05-02 LAB — PROTIME-INR
INR: 0.9
Prothrombin Time: 12.7

## 2011-05-02 LAB — BASIC METABOLIC PANEL
BUN: 5 — ABNORMAL LOW
CO2: 28
Calcium: 9.5
Chloride: 103
Creatinine, Ser: 0.78
GFR calc Af Amer: >60
Glucose, Bld: 87

## 2011-05-02 LAB — CBC
MCHC: 33.4
MCV: 92.6
Platelets: 305
RDW: 14.3

## 2011-05-02 LAB — CROSSMATCH: Antibody Screen: NEGATIVE

## 2011-05-06 LAB — DIFFERENTIAL
Basophils Absolute: 0
Basophils Relative: 0
Eosinophils Absolute: 0.1
Eosinophils Absolute: 0.1
Eosinophils Absolute: 0.1
Eosinophils Relative: 0
Eosinophils Relative: 1
Eosinophils Relative: 2
Lymphocytes Relative: 10 — ABNORMAL LOW
Lymphocytes Relative: 15
Lymphs Abs: 1.2
Lymphs Abs: 1.4
Lymphs Abs: 1.6
Monocytes Absolute: 0.7
Monocytes Absolute: 0.9 — ABNORMAL HIGH
Monocytes Relative: 7
Monocytes Relative: 9

## 2011-05-06 LAB — CBC
HCT: 33.2 — ABNORMAL LOW
HCT: 36.2
HCT: 36.9
Hemoglobin: 11.5 — ABNORMAL LOW
MCHC: 34.3
MCHC: 34.5
MCV: 96.2
MCV: 96.8
MCV: 96.8
Platelets: 209
Platelets: 210
Platelets: 221
RDW: 14.2 — ABNORMAL HIGH
RDW: 14.4 — ABNORMAL HIGH
WBC: 11.3 — ABNORMAL HIGH

## 2011-05-06 LAB — BASIC METABOLIC PANEL
BUN: 10
BUN: 5 — ABNORMAL LOW
BUN: 5 — ABNORMAL LOW
CO2: 30
Chloride: 102
Chloride: 105
Chloride: 108
Glucose, Bld: 111 — ABNORMAL HIGH
Glucose, Bld: 120 — ABNORMAL HIGH
Potassium: 4.2
Potassium: 4.3
Potassium: 4.6
Sodium: 141

## 2011-05-06 LAB — PROTIME-INR: Prothrombin Time: 16.7 — ABNORMAL HIGH

## 2011-05-09 LAB — CBC
HCT: 41.3
Hemoglobin: 14.2
MCHC: 34.4
MCV: 95.6
RBC: 4.32
RDW: 14.1 — ABNORMAL HIGH

## 2011-05-09 LAB — CROSSMATCH: Antibody Screen: NEGATIVE

## 2011-05-09 LAB — BASIC METABOLIC PANEL
CO2: 23
Calcium: 8.9
Chloride: 108
GFR calc Af Amer: >60
Glucose, Bld: 100 — ABNORMAL HIGH
Sodium: 138

## 2011-05-10 ENCOUNTER — Ambulatory Visit (HOSPITAL_COMMUNITY)
Admission: RE | Admit: 2011-05-10 | Discharge: 2011-05-10 | Disposition: A | Discharge status: Home / Self Care | Payer: Medicare Other | Source: Ambulatory Visit | Attending: Obstetrics and Gynecology | Admitting: Obstetrics and Gynecology

## 2011-05-10 DIAGNOSIS — Z1231 Encounter for screening mammogram for malignant neoplasm of breast: Secondary | ICD-10-CM | POA: Insufficient documentation

## 2011-05-10 DIAGNOSIS — Z139 Encounter for screening, unspecified: Secondary | ICD-10-CM

## 2011-06-07 ENCOUNTER — Other Ambulatory Visit (HOSPITAL_COMMUNITY): Payer: Self-pay | Admitting: Family Medicine

## 2011-06-07 ENCOUNTER — Ambulatory Visit (HOSPITAL_COMMUNITY)
Admission: RE | Admit: 2011-06-07 | Discharge: 2011-06-07 | Disposition: A | Discharge status: Home / Self Care | Payer: Medicare Other | Source: Ambulatory Visit | Attending: Family Medicine | Admitting: Family Medicine

## 2011-06-07 DIAGNOSIS — R5381 Other malaise: Secondary | ICD-10-CM | POA: Insufficient documentation

## 2011-06-17 ENCOUNTER — Ambulatory Visit (HOSPITAL_COMMUNITY)
Admission: RE | Admit: 2011-06-17 | Discharge: 2011-06-17 | Disposition: A | Discharge status: Home / Self Care | Payer: Medicare Other | Source: Ambulatory Visit | Attending: Family Medicine | Admitting: Family Medicine

## 2011-06-17 DIAGNOSIS — R0609 Other forms of dyspnea: Secondary | ICD-10-CM | POA: Insufficient documentation

## 2011-06-17 DIAGNOSIS — R0989 Other specified symptoms and signs involving the circulatory and respiratory systems: Secondary | ICD-10-CM | POA: Insufficient documentation

## 2011-08-10 ENCOUNTER — Encounter: Payer: Self-pay | Admitting: Orthopedic Surgery

## 2011-08-10 ENCOUNTER — Ambulatory Visit (INDEPENDENT_AMBULATORY_CARE_PROVIDER_SITE_OTHER): Payer: Medicare Other | Admitting: Orthopedic Surgery

## 2011-08-10 VITALS — BP 118/66 | Ht 64.5 in | Wt 210.0 lb

## 2011-08-10 DIAGNOSIS — Z96659 Presence of unspecified artificial knee joint: Secondary | ICD-10-CM | POA: Insufficient documentation

## 2011-08-10 NOTE — Progress Notes (Signed)
Patient ID: [name], female   DOB: May 24, 1951, 61 y.o.   MRN: [id number]    Routine followup postoperative visit, and we'll knee x-ray.  Status post bilateral knee replacements staged, 2008.  Reports she was doing well except last week. She had an episode of sharp medial knee pain where the knee felt like it might give way. She had not experienced this before. Has not had any symptoms since. Review of systems negative except for a bunion which will be evaluated. This week by Dr. Nolen Mu.  Exam of the RIGHT knee reveals full flexion of the knee full extension. Well-healed skin incision with no tenderness or swelling of the joint. The knee is stable in extension, as well as flexion. Extension power is normal. Ambulation is without assistive device, and no limp.  Bilateral knee x-rays are. The implants are stable compared to last year's thumb with no sign of loosening and excellent alignment.  Impression stable knee replacements.  Routine activities as tolerated. Follow up in one year for annual bilateral film.

## 2011-08-10 NOTE — Patient Instructions (Signed)
Activity as tolerated

## 2011-09-20 ENCOUNTER — Encounter (HOSPITAL_COMMUNITY): Payer: Self-pay | Admitting: Pharmacy Technician

## 2011-09-23 ENCOUNTER — Encounter (HOSPITAL_COMMUNITY)
Admission: RE | Admit: 2011-09-23 | Discharge: 2011-09-23 | Disposition: A | Discharge status: Home / Self Care | Payer: Medicare Other | Source: Ambulatory Visit | Attending: Podiatry | Admitting: Podiatry

## 2011-09-23 ENCOUNTER — Encounter (HOSPITAL_COMMUNITY): Payer: Self-pay

## 2011-09-23 HISTORY — DX: Carpal tunnel syndrome, bilateral upper limbs: G56.03

## 2011-09-23 HISTORY — DX: Anxiety disorder, unspecified: F41.9

## 2011-09-23 LAB — BASIC METABOLIC PANEL
GFR calc Af Amer: >90 mL/min (ref >90)
GFR calc non Af Amer: >90 mL/min (ref >90)
Potassium: 4.4 mEq/L (ref 3.5–5.1)
Sodium: 137 mEq/L (ref 135–145)

## 2011-09-23 LAB — SURGICAL PCR SCREEN
MRSA, PCR: NEGATIVE
Staphylococcus aureus: NEGATIVE

## 2011-09-23 LAB — HEMOGLOBIN AND HEMATOCRIT, BLOOD
HCT: 38.4 % (ref 36.0–46.0)
Hemoglobin: 12.7 g/dL (ref 12.0–15.0)

## 2011-09-23 MED ORDER — CHLORHEXIDINE GLUCONATE 4 % EX LIQD
1.0000 "application " | Freq: Once | CUTANEOUS | Status: DC
  Filled 2011-09-23: qty 15

## 2011-09-23 NOTE — Patient Instructions (Signed)
20 [name]  09/23/2011   Your procedure is scheduled on:  09/29/11  Report to [facility] at 06:15 AM.  Call this number if you have problems the morning of surgery: [phone]   Remember:   Do not eat food:After Midnight.  May have clear liquids:until Midnight .  Clear liquids include soda, tea, black coffee, apple or grape juice, broth.  Take these medicines the morning of surgery with A SIP OF WATER: Xanax   Do not wear jewelry, make-up or nail polish.  Do not wear lotions, powders, or perfumes. You may wear deodorant.  Do not shave 48 hours prior to surgery.  Do not bring valuables to the hospital.  Contacts, dentures or bridgework may not be worn into surgery.  Leave suitcase in the car. After surgery it may be brought to your room.  For patients admitted to the hospital, checkout time is 11:00 AM the day of discharge.   Patients discharged the day of surgery will not be allowed to drive home.  Name and phone number of your driver:   Special Instructions: CHG Shower Use Special Wash: 1/2 bottle night before surgery and 1/2 bottle morning of surgery.   Please read over the following fact sheets that you were given: Pain Booklet, MRSA Information, Surgical Site Infection Prevention, Anesthesia Post-op Instructions and Care and Recovery After Surgery    Bunionectomy A bunionectomy is surgery to remove a bunion. A bunion is an enlargement of the joint at the base of the big toe. It is made up of bone and soft tissue on the inside part of the joint. Over time, a painful lump appears on the inside of the joint. The big toe begins to point inward toward the second toe. New bone growth can occur and a bone spur may form. The pain eventually causes difficulty walking. A bunion usually results from inflammation caused by the irritation of poorly fitting shoes. It often begins later in life. A bunionectomy is performed when nonsurgical treatment no longer works. When surgery is needed, the  extent of the procedure will depend on the degree of deformity of the foot. Your surgeon will discuss with you the different procedures and what will work best for you depending on your age and health. LET YOUR CAREGIVER KNOW ABOUT:   Previous problems with anesthetics or medicines used to numb the skin.   Allergies to dyes, iodine, foods, and/or latex.   Medicines taken including herbs, eye drops, prescription medicines (especially medicines used to "thin the blood"), aspirin and other over-the-counter medicines, and steroids (by mouth or as a cream).   History of bleeding or blood problems.   Possibility of pregnancy, if this applies.   History of blood clots in your legs and/or lungs .   Previous surgery.   Other important health problems.  RISKS AND COMPLICATIONS   Infection.   Pain.   Nerve damage.   Possibility that the bunion will recur.  BEFORE THE PROCEDURE  You should be present 60 minutes prior to your procedure or as directed.  PROCEDURE  Surgery is often done so that you can go home the same day (outpatient). It may be done in a hospital or in an outpatient surgical center. An anesthetic will be used to help you sleep during the procedure. Sometimes, a spinal anesthetic is used to make you numb below the waist. A cut (incision) is made over the swollen area at the first joint of the big toe. The enlarged lump will  be removed. If there is a need to reposition the bones of the big toe, this may require more than 1 incision. The bone itself may need to be cut. Screws and wires may be used in the repair. These can be removed at a later date. In severe cases, the entire joint may need to be removed and a joint replacement inserted. When done, the incision is closed with stitches (sutures). Skin adhesive strips may be added for reinforcement. They help hold the incision closed.  AFTER THE PROCEDURE  Compression bandages (dressings) are then wrapped around the wound. This helps  to keep the foot in alignment and reduce swelling. Your foot will be monitored for bleeding and swelling. You will need to stay for a few hours in the recovery area before being discharged. This allows time for the anesthesia to wear off. You will be discharged home when you are awake, stable, and doing well. HOME CARE INSTRUCTIONS   You can expect to return to normal activities within 6 to 8 weeks after surgery. The foot is at increased risk for swelling for several months. When you can expect to bear weight on the operated foot will depend on the extent of your surgery. The milder the deformity, the less tissue is removed and the sooner the return to normal activity level. During the recovery period, a special shoe, boot, or cast may be worn to accommodate the surgical bandage and to help provide stability to the foot.   Once you are home, an ice pack applied to the operative site may help with discomfort and keep swelling down. Stop using the ice if it causes discomfort.   Keep your feet raised (elevated) when possible to lessen swelling.   If you have an elastic bandage on your foot and you have numbness, tingling, or your foot becomes cold and blue, adjust the bandage to make it comfortable.   Change dressings as directed.   Keep the wound dry and clean. The wound may be washed gently with soap and water. Gently blot dry without rubbing. Do not take baths or use swimming pools or hot tubs for 10 days, or as instructed by your caregiver.   Only take over-the-counter or prescription medicines for pain, discomfort, or fever as directed by your caregiver.   You may continue a normal diet as directed.   For activity, use crutches with no weight bearing or your orthopedic shoe as directed. Continue to use crutches or a cane as directed until you can stand without causing pain.  SEEK MEDICAL CARE IF:   You have redness, swelling, bruising, or increasing pain in the wound.   There is pus coming  from the wound.   You have drainage from a wound lasting longer than 1 day.   You have an oral temperature above 102 F (38.9 C).   You notice a bad smell coming from the wound or dressing.   The wound breaks open after sutures have been removed.   You develop dizzy episodes or fainting while standing.   You have persistent nausea or vomiting.   Your toes become cold.   Pain is not relieved with medicines.  SEEK IMMEDIATE MEDICAL CARE IF:   You develop a rash.   You have difficulty breathing.   You develop any reaction or side effects to medicines given.   Your toes are numb or blue, or you have severe pain.  MAKE SURE YOU:   Understand these instructions.   Will watch your  condition.   Will get help right away if you are not doing well or get worse.  Document Released: 06/24/2005 Document Revised: 03/23/2011 Document Reviewed: 07/30/2007 [facility] Patient Information 2012 [place], [place].   PATIENT INSTRUCTIONS POST-ANESTHESIA  IMMEDIATELY FOLLOWING SURGERY:  Do not drive or operate machinery for the first twenty four hours after surgery.  Do not make any important decisions for twenty four hours after surgery or while taking narcotic pain medications or sedatives.  If you develop intractable nausea and vomiting or a severe headache please notify your doctor immediately.  FOLLOW-UP:  Please make an appointment with your surgeon as instructed. You do not need to follow up with anesthesia unless specifically instructed to do so.  WOUND CARE INSTRUCTIONS (if applicable):  Keep a dry clean dressing on the anesthesia/puncture wound site if there is drainage.  Once the wound has quit draining you may leave it open to air.  Generally you should leave the bandage intact for twenty four hours unless there is drainage.  If the epidural site drains for more than 36-48 hours please call the anesthesia department.  QUESTIONS?:  Please feel free to call your physician or the hospital  operator if you have any questions, and they will be happy to assist you.     [facility] Anesthesia Department [address] [place] [phone]

## 2011-09-28 NOTE — H&P (Signed)
Chief Complaint / History of Present Illness: This 61 year old female returns today for a consultation to discusses the proposed surgery.  She is scheduled for [facility] of the right foot with possible Aiken osteotomy of the right foot on 09/29/2011 at [facility].  Past Medical History:  Neurological Hx: (+) depression.   Psychiatric Hx: (+) anxiety disorder.   Renal / Urinary Hx: (+) kidney stones.   Cardiovascular Hx: (+) high cholesterol.   Musculoskeletal Hx: (+) back problems.   Medication History: Active: alprazolam 1 mg tablet (active), naproxen 500 mg tablet (active), simvastatin 40 mg tablet (active).   Allergies: No known medical allergies Past Surgical History: Patient/Guardian admits past surgical history of back surgery, knee replacement (left), knee replacement (right), appendectomy, cyst removed from breast.   Social History: Patient/Guardian admits alcohol use. Drinking is described as social. Patient/Guardian admits tobacco use. She currently is not smoking, Patient/Guardian denies illegal drug use.   Family History: Patient/Guardian admits a family history of cancer of lung associated with father, cancer of liver associated with sibling, heart problems associated with father.   Review of Systems: No abnormal findings with the exception of the chief complaint.    Physical Exam: The patient is a pleasant, 61 year old female in no apparent distress.  She is oriented to person, place and time.  She is ambulatory.  Skin is warm, dry and supple.  Right 3rd toenail, right 5th toenail, left 5th toenail, left 2nd toenail, left great toenail and right 2nd toenail is dystrophic, elongated, discolored, thickened and with distal subungual debris.  Remainder of nails are elongated and free of pathology.  Pedal hair growth and distribution is normal.  No ulceration are present.  Dorsalis pedis and posterior tibial pulses measure 2/4.  Capillary refill time is immediate.  No pedal  edema is present.  Muscle tone is normal.  Muscle strength is 5/5 with regards to dorsiflexion, plantarflexion, inversion and eversion.  Right 1st MTPJ ROM reveals decreased extension without pain.  Left 1st MTPJ ROM reveals decreased extension without pain.  Pain with palpation of the right 1st metatarsal head is present.  There is dorsal contraction of the 2nd digit of the right foot.  There is lateral deviation of the hallux with a dorsomedial prominence of the first metatarsal head of both feet (right >>>> left).  Light touch sensation is grossly intact.    Test Results:  None to report at this time.    Impression:  Hallux valgus.  Hammertoe deformity.  Onychomycosis.    Plan:   The podiatric pathology and treatment options were again reviewed with the her.  I discussed with the her the surgical procedure itself, the indications, the risks, possible complications, post-operative course and alternative treatments. I gave no guarantees regarding the outcome. She would like to proceed with the proposed surgery.  An informed consent was obtained.  She is to return for her post-operative appointment or sooner if problems arise.  Prescriptions: Rx: Phenergan- 12.5 mg tablet , Take 1 tablet by mouth every four to six hours as needed. Max daily dose: 6.  Dispense: 21 tablet. Refills: 1.  Allow Generic: Yes Rx: Percocet- 325 mg-10mg  tablet , Take 1 tablet by mouth every four to six hours as needed for pain. Max daily dose: 6.  Dispense: 42 tablet. Refills: 0.  Allow Generic: Yes

## 2011-09-29 ENCOUNTER — Encounter (HOSPITAL_COMMUNITY): Payer: Self-pay | Admitting: *Deleted

## 2011-09-29 ENCOUNTER — Ambulatory Visit (HOSPITAL_COMMUNITY): Payer: Medicare Other

## 2011-09-29 ENCOUNTER — Ambulatory Visit (HOSPITAL_COMMUNITY): Payer: Medicare Other | Admitting: Anesthesiology

## 2011-09-29 ENCOUNTER — Ambulatory Visit (HOSPITAL_COMMUNITY)
Admission: RE | Admit: 2011-09-29 | Discharge: 2011-09-29 | Disposition: A | Discharge status: Home / Self Care | Payer: Medicare Other | Source: Ambulatory Visit | Attending: Podiatry | Admitting: Podiatry

## 2011-09-29 ENCOUNTER — Encounter (HOSPITAL_COMMUNITY)
Admission: RE | Disposition: A | Discharge status: Home / Self Care | Payer: Self-pay | Source: Ambulatory Visit | Attending: Podiatry

## 2011-09-29 ENCOUNTER — Encounter (HOSPITAL_COMMUNITY): Payer: Self-pay | Admitting: Anesthesiology

## 2011-09-29 DIAGNOSIS — Z01812 Encounter for preprocedural laboratory examination: Secondary | ICD-10-CM | POA: Insufficient documentation

## 2011-09-29 DIAGNOSIS — M2010 Hallux valgus (acquired), unspecified foot: Secondary | ICD-10-CM | POA: Insufficient documentation

## 2011-09-29 HISTORY — PX: BUNIONECTOMY: SHX129

## 2011-09-29 SURGERY — BUNIONECTOMY
Anesthesia: Monitor Anesthesia Care | Site: Foot | Laterality: Right | Wound class: Clean

## 2011-09-29 MED ORDER — FENTANYL CITRATE 0.05 MG/ML IJ SOLN
INTRAMUSCULAR | Status: DC | PRN
  Administered 2011-09-29: 50 ug via INTRAVENOUS
  Administered 2011-09-29: 25 ug via INTRAVENOUS
  Administered 2011-09-29 (×2): 50 ug via INTRAVENOUS
  Administered 2011-09-29: 25 ug via INTRAVENOUS

## 2011-09-29 MED ORDER — ONDANSETRON HCL 4 MG/2ML IJ SOLN: 4.0000 mg | Freq: Once | INTRAMUSCULAR | Status: DC | PRN

## 2011-09-29 MED ORDER — FENTANYL CITRATE 0.05 MG/ML IJ SOLN
INTRAMUSCULAR | Status: AC
  Filled 2011-09-29: qty 2

## 2011-09-29 MED ORDER — LACTATED RINGERS IV SOLN: INTRAVENOUS | Status: DC

## 2011-09-29 MED ORDER — MIDAZOLAM HCL 2 MG/2ML IJ SOLN
1.0000 mg | INTRAMUSCULAR | Status: DC | PRN
  Administered 2011-09-29 (×2): 2 mg via INTRAVENOUS

## 2011-09-29 MED ORDER — PROPOFOL 10 MG/ML IV EMUL
INTRAVENOUS | Status: DC | PRN
  Administered 2011-09-29: 75 ug/kg/min via INTRAVENOUS

## 2011-09-29 MED ORDER — MIDAZOLAM HCL 2 MG/2ML IJ SOLN
INTRAMUSCULAR | Status: AC
  Filled 2011-09-29: qty 2

## 2011-09-29 MED ORDER — CEFAZOLIN SODIUM-DEXTROSE 2-3 GM-% IV SOLR: 2.0000 g | INTRAVENOUS | Status: DC

## 2011-09-29 MED ORDER — LIDOCAINE HCL (PF) 1 % IJ SOLN
INTRAMUSCULAR | Status: DC | PRN
  Administered 2011-09-29: 18 mL

## 2011-09-29 MED ORDER — PROPOFOL 10 MG/ML IV EMUL
INTRAVENOUS | Status: AC
  Filled 2011-09-29: qty 20

## 2011-09-29 MED ORDER — MIDAZOLAM HCL 5 MG/5ML IJ SOLN
INTRAMUSCULAR | Status: DC | PRN
  Administered 2011-09-29: 2 mg via INTRAVENOUS

## 2011-09-29 MED ORDER — LACTATED RINGERS IV SOLN
INTRAVENOUS | Status: DC
  Administered 2011-09-29: 1000 mL via INTRAVENOUS

## 2011-09-29 MED ORDER — MIDAZOLAM HCL 2 MG/2ML IJ SOLN
INTRAMUSCULAR | Status: AC
  Administered 2011-09-29: 2 mg via INTRAVENOUS
  Filled 2011-09-29: qty 2

## 2011-09-29 MED ORDER — CEFAZOLIN SODIUM 1-5 GM-% IV SOLN
INTRAVENOUS | Status: DC | PRN
  Administered 2011-09-29: 2 g via INTRAVENOUS

## 2011-09-29 MED ORDER — LACTATED RINGERS IV SOLN
INTRAVENOUS | Status: DC | PRN
  Administered 2011-09-29: 07:00:00 via INTRAVENOUS

## 2011-09-29 MED ORDER — FENTANYL CITRATE 0.05 MG/ML IJ SOLN: 25.0000 ug | INTRAMUSCULAR | Status: DC | PRN

## 2011-09-29 MED ORDER — BUPIVACAINE HCL (PF) 0.5 % IJ SOLN
INTRAMUSCULAR | Status: DC | PRN
  Administered 2011-09-29: 20 mL

## 2011-09-29 MED ORDER — CEFAZOLIN SODIUM-DEXTROSE 2-3 GM-% IV SOLR
INTRAVENOUS | Status: AC
  Filled 2011-09-29: qty 50

## 2011-09-29 MED ORDER — LIDOCAINE HCL (PF) 1 % IJ SOLN
INTRAMUSCULAR | Status: AC
  Filled 2011-09-29: qty 5

## 2011-09-29 MED ORDER — LIDOCAINE HCL (PF) 1 % IJ SOLN
INTRAMUSCULAR | Status: AC
  Filled 2011-09-29: qty 30

## 2011-09-29 MED ORDER — SODIUM CHLORIDE 0.9 % IR SOLN
Status: DC | PRN
  Administered 2011-09-29: 1000 mL

## 2011-09-29 MED ORDER — BUPIVACAINE HCL (PF) 0.5 % IJ SOLN
INTRAMUSCULAR | Status: AC
  Filled 2011-09-29: qty 30

## 2011-09-29 SURGICAL SUPPLY — 74 items
BAG HAMPER (MISCELLANEOUS) ×3 IMPLANT
BANDAGE CONFORM 2  STR LF (GAUZE/BANDAGES/DRESSINGS) ×3 IMPLANT
BANDAGE ELASTIC 4 VELCRO NS (GAUZE/BANDAGES/DRESSINGS) ×3 IMPLANT
BANDAGE ESMARK 4X12 BL STRL LF (DISPOSABLE) ×2 IMPLANT
BANDAGE GAUZE ELAST BULKY 4 IN (GAUZE/BANDAGES/DRESSINGS) ×3 IMPLANT
BENZOIN TINCTURE PRP APPL 2/3 (GAUZE/BANDAGES/DRESSINGS) ×3 IMPLANT
BIT DRILL 2.0 HCS 150 (BIT) ×3 IMPLANT
BLADE AVERAGE 25X9 (BLADE) ×3 IMPLANT
BLADE OSC/SAG 11.5X5.5X.38 (BLADE) IMPLANT
BLADE OSC/SAG 18.5X9 THN (BLADE) IMPLANT
BLADE OSC/SAGITTAL MD 5.5X18 (BLADE) IMPLANT
BLADE OSC/SAGITTAL MD 9X18.5 (BLADE) IMPLANT
BLADE SURG 15 STRL LF DISP TIS (BLADE) ×4 IMPLANT
BLADE SURG 15 STRL SS (BLADE) ×2
BNDG ESMARK 4X12 BLUE STRL LF (DISPOSABLE) ×3
CAP PIN PROTECTOR ORTHO WHT (CAP) IMPLANT
CHLORAPREP W/TINT 26ML (MISCELLANEOUS) ×3 IMPLANT
CLOTH BEACON ORANGE TIMEOUT ST (SAFETY) ×3 IMPLANT
COVER LIGHT HANDLE STERIS (MISCELLANEOUS) ×6 IMPLANT
CUFF TOURN SGL LL 12 (TOURNIQUET CUFF) IMPLANT
CUFF TOURNIQUET SINGLE 18IN (TOURNIQUET CUFF) ×3 IMPLANT
DECANTER SPIKE VIAL GLASS SM (MISCELLANEOUS) ×6 IMPLANT
DRAPE OEC MINIVIEW 54X84 (DRAPES) ×3 IMPLANT
DRSG ADAPTIC 3X8 NADH LF (GAUZE/BANDAGES/DRESSINGS) ×3 IMPLANT
DURA STEPPER LG (CAST SUPPLIES) IMPLANT
DURA STEPPER MED (CAST SUPPLIES) ×3 IMPLANT
DURA STEPPER SML (CAST SUPPLIES) IMPLANT
DURA STEPPER XL (SOFTGOODS) IMPLANT
ELECT REM PT RETURN 9FT ADLT (ELECTROSURGICAL) ×3
ELECTRODE REM PT RTRN 9FT ADLT (ELECTROSURGICAL) ×2 IMPLANT
GAUZE STERILE 3X3 12 PLY (MISCELLANEOUS) IMPLANT
GLOVE BIO SURGEON STRL SZ7.5 (GLOVE) ×3 IMPLANT
GLOVE BIO SURGEON STRL SZ8 (GLOVE) ×3 IMPLANT
GLOVE BIOGEL PI IND STRL 7.0 (GLOVE) ×2 IMPLANT
GLOVE BIOGEL PI IND STRL 8.5 (GLOVE) ×2 IMPLANT
GLOVE BIOGEL PI INDICATOR 7.0 (GLOVE) ×1
GLOVE BIOGEL PI INDICATOR 8.5 (GLOVE) ×1
GLOVE ECLIPSE 8.0 STRL XLNG CF (GLOVE) ×6 IMPLANT
GLOVE SS BIOGEL STRL SZ 6.5 (GLOVE) ×2 IMPLANT
GLOVE SUPERSENSE BIOGEL SZ 6.5 (GLOVE) ×1
GOWN STRL REIN 3XL XLG LVL4 (GOWN DISPOSABLE) ×3 IMPLANT
GOWN STRL REIN XL XLG (GOWN DISPOSABLE) ×6 IMPLANT
GOWN W/COTTON TOWEL STD LRG (GOWNS) IMPLANT
K-WIRE 229MX1.6 (WIRE) IMPLANT
K-WIRE 6 (WIRE)
K-WIRE NON THREAD 1.1 (WIRE) ×3
KIT ROOM TURNOVER AP CYSTO (KITS) IMPLANT
KIT ROOM TURNOVER APOR (KITS) ×3 IMPLANT
KWIRE 6 (WIRE) IMPLANT
KWIRE NON THREAD 1.1 (WIRE) ×2 IMPLANT
MANIFOLD NEPTUNE II (INSTRUMENTS) ×3 IMPLANT
NEEDLE HYPO 18GX1.5 BLUNT FILL (NEEDLE) IMPLANT
NEEDLE HYPO 27GX1-1/4 (NEEDLE) ×9 IMPLANT
NS IRRIG 1000ML POUR BTL (IV SOLUTION) ×3 IMPLANT
PACK BASIC LIMB (CUSTOM PROCEDURE TRAY) ×3 IMPLANT
PAD ARMBOARD 7.5X6 YLW CONV (MISCELLANEOUS) ×3 IMPLANT
PAIN PUMP ON-Q 100MLX2ML 2.5IN (PAIN MANAGEMENT) IMPLANT
PIN CAPS ORTHO GREEN .062 (PIN) IMPLANT
RASP SM TEAR CROSS CUT (RASP) IMPLANT
SALINE 1000ML FOR IRRIGATION (IV SOLUTION) ×3 IMPLANT
SCREW CANN COMP 3.0X20 (Screw) ×3 IMPLANT
SET BASIN LINEN APH (SET/KITS/TRAYS/PACK) ×3 IMPLANT
SPONGE GAUZE 4X4 12PLY (GAUZE/BANDAGES/DRESSINGS) ×3 IMPLANT
STRIP CLOSURE SKIN 1/2X4 (GAUZE/BANDAGES/DRESSINGS) ×3 IMPLANT
STRIP CLOSURE SKIN 1/8X3 (GAUZE/BANDAGES/DRESSINGS) IMPLANT
SUT MON AB 5-0 PS2 18 (SUTURE) ×3 IMPLANT
SUT VIC AB 2-0 CT2 27 (SUTURE) IMPLANT
SUT VIC AB 4-0 PS2 27 (SUTURE) ×3 IMPLANT
SUT VICRYL AB 3-0 FS1 BRD 27IN (SUTURE) ×3 IMPLANT
SYR 3ML LL SCALE MARK (SYRINGE) ×3 IMPLANT
SYR 50ML LL SCALE MARK (SYRINGE) ×3 IMPLANT
SYR BULB IRRIGATION 50ML (SYRINGE) ×3 IMPLANT
SYR CONTROL 10ML LL (SYRINGE) ×9 IMPLANT
TOWEL COTTON PACK 4EA (MISCELLANEOUS) ×3 IMPLANT

## 2011-09-29 NOTE — H&P (Signed)
HISTORY AND PHYSICAL INTERVAL NOTE:  09/29/2011  7:29 AM  [name]  has presented today for surgery, with the diagnosis of hallux valgus right foot.  The various methods of treatment have been discussed with the patient.  No guarantees were given.  After consideration of risks, benefits and other options for treatment, the patient has consented to surgery.  I have reviewed the patients' chart and labs.    Patient Vitals for the past 24 hrs:  BP Temp Temp src Resp SpO2  09/29/11 0715 - - - 16  100 %  09/29/11 0700 - - - 16  95 %  09/29/11 0645 - - - 21  -  09/29/11 0630 118/75 mmHg - - 16  96 %  09/29/11 0626 - 97.9 F (36.6 C) Oral 20  95 %    A history and physical examination was performed in my office on 09/21/2011.  The patient was reexamined.  There have been no changes to this history and physical examination.  Dallas Schimke, DPM

## 2011-09-29 NOTE — Addendum Note (Signed)
Addendum  created 09/29/11 0931 by Felipe Paluch Andraza, CRNA   Modules edited:Anesthesia Medication Administration    

## 2011-09-29 NOTE — Brief Op Note (Signed)
OPERATIVE REPORT  SURGEON:   Dallas Schimke, [place]  OR STAFF:   Lennox Pippins, RN - Circulator Hurshel Party, CST - Scrub Person Carver Fila, RN - RN First Assistant   PREOPERATIVE DIAGNOSIS:   Hallux valgus right foot  POSTOPERATIVE DIAGNOSIS: Hallux valgus right foot  PROCEDURE: Eliberto Ivory bunionectomy right foot  ANESTHESIA:  Monitor Anesthesia Care   HEMOSTASIS:    Total Tourniquet Time Documented: Leg (Right) - 53 minutes; Tourniquet set at 250 mmHg  ESTIMATED BLOOD LOSS:   Minimal   MATERIALS USED:  Synthes 3.50mm screw  INJECTABLES: Marcaine 0.5% plain and 1.0% Lidocaine plain  PATHOLOGY:   None  COMPLICATIONS:   None  INDICATIONS:  Painful bunion deformity of the right foot  DICTATION:  Other Dictation: Dictation Number Not Listed

## 2011-09-29 NOTE — Addendum Note (Signed)
Addendum  created 09/29/11 0931 by Corena Pilgrim, CRNA   Modules edited:Anesthesia Medication Administration

## 2011-09-29 NOTE — Anesthesia Preprocedure Evaluation (Addendum)
Anesthesia Evaluation  Patient identified by MRN, date of birth, ID band Patient awake    Reviewed: Allergy & Precautions, H&P , NPO status , Patient's Chart, lab work & pertinent test results  History of Anesthesia Complications Negative for: history of anesthetic complications  Airway Mallampati: II      Dental  (+) Edentulous Upper and Edentulous Lower   Pulmonary neg pulmonary ROS,    Pulmonary exam normal       Cardiovascular negative cardio ROS  Rhythm:Regular Rate:Normal     Neuro/Psych Anxiety  Neuromuscular disease    GI/Hepatic   Endo/Other    Renal/GU      Musculoskeletal   Abdominal   Peds  Hematology   Anesthesia Other Findings   Reproductive/Obstetrics                          Anesthesia Physical Anesthesia Plan  ASA: II  Anesthesia Plan: MAC   Post-op Pain Management:    Induction: Intravenous  Airway Management Planned: Nasal Cannula  Additional Equipment:   Intra-op Plan:   Post-operative Plan:   Informed Consent: I have reviewed the patients History and Physical, chart, labs and discussed the procedure including the risks, benefits and alternatives for the proposed anesthesia with the patient or authorized representative who has indicated his/her understanding and acceptance.     Plan Discussed with:   Anesthesia Plan Comments:         Anesthesia Quick Evaluation

## 2011-09-29 NOTE — Anesthesia Procedure Notes (Addendum)
Date/Time: 09/29/2011 7:52 AM Performed by: Corena Pilgrim   Procedure Name: MAC Date/Time: 09/29/2011 7:37 AM Performed by: Carolyne Littles, Marjo Grosvenor Pre-anesthesia Checklist: Patient identified, Timeout performed, Emergency Drugs available, Suction available and Patient being monitored Patient Re-evaluated:Patient Re-evaluated prior to inductionOxygen Delivery Method: Non-rebreather mask Preoxygenation: Pre-oxygenation with 100% oxygen

## 2011-09-29 NOTE — Anesthesia Postprocedure Evaluation (Signed)
  Anesthesia Post-op Note  Patient: [name]  Procedure(s) Performed: Procedure(s) (LRB): BUNIONECTOMY (Right)  Patient Location: PACU  Anesthesia Type: MAC  Level of Consciousness: awake, alert , oriented and patient cooperative  Airway and Oxygen Therapy: Patient Spontanous Breathing  Post-op Pain: none  Post-op Assessment: Post-op Vital signs reviewed, Patient's Cardiovascular Status Stable, Respiratory Function Stable and Patent Airway  Post-op Vital Signs: Reviewed and stable  Complications: No apparent anesthesia complications

## 2011-09-29 NOTE — Preoperative (Signed)
Beta Blockers   Reason not to administer Beta Blockers:Not Applicable 

## 2011-09-29 NOTE — Transfer of Care (Signed)
Immediate Anesthesia Transfer of Care Note  Patient: [name]  Procedure(s) Performed: Procedure(s) (LRB): BUNIONECTOMY (Right)  Patient Location: PACU  Anesthesia Type: MAC  Level of Consciousness: awake, alert , oriented and patient cooperative  Airway & Oxygen Therapy: Patient Spontanous Breathing  Post-op Assessment: Report given to PACU RN and Post -op Vital signs reviewed and stable  Post vital signs: Reviewed and stable  Complications: No apparent anesthesia complications

## 2011-09-30 ENCOUNTER — Encounter (HOSPITAL_COMMUNITY): Payer: Self-pay | Admitting: Podiatry

## 2011-09-30 NOTE — Op Note (Signed)
[name], [name]               ACCOUNT NO.:  [id number]  MEDICAL RECORD NO.:  [id number]  LOCATION:  [facility]                          FACILITY:  [facility]  PHYSICIAN:  B. Theola Sequin, MD   DATE OF BIRTH:  27-Oct-1950  DATE OF PROCEDURE:  09/29/2011 DATE OF DISCHARGE:  09/29/2011                              OPERATIVE REPORT   SURGEON:  B. Theola Sequin, DPM  ASSISTANT:  Holley Bouche  PREOPERATIVE DIAGNOSIS:  Hallux valgus, right foot.  POSTOPERATIVE DIAGNOSIS:  Hallux valgus, right foot.  PROCEDURE:  Austin bunionectomy, right foot.  ANESTHESIA:  MAC with local.  HEMOSTASIS:  Pneumatic ankle tourniquet at 250 mmHg.  ESTIMATED BLOOD LOSS:  Minimal.  MATERIALS:  Synthes 3.0 mm screw.  INJECTABLES:  0.5% Marcaine plain and 1% lidocaine plain.  PATHOLOGY:  None.  COMPLICATIONS:  None.  PROCEDURE IN DETAIL:  The patient was brought to the operating room, placed on the operative table in supine position.  The pneumatic ankle tourniquet was placed about the patient's right ankle.  The foot was then anesthetized using 0.5% Marcaine plain.  The foot was scrubbed, prepped, and draped in the usual sterile manner.  The limb was then elevated, exsanguinated, and the pneumatic ankle tourniquet was inflated to 250 mmHg.  Attention was then directed to the dorsomedial aspect of the right foot where a linear longitudinal incision was made medial and parallel to the extensor hallucis longus tendon.  The incision was continued deep down to the level of the first metatarsophalangeal joint.  A linear longitudinal periosteal and capsular incisions were then performed.  The periosteal and capsular structures were reflected medially and laterally, thus exposing the head of the first metatarsal at the operative site.  The prominent medial eminence was resected using a power of bone saw.  A Chevron osteotomy was performed in the first metatarsal bone.  The capital fragment was distracted and  shifted into a more lateral position.  A Synthes 3.0-mm cannulated screw was inserted across the osteotomy site with adequate compression noted.  Position of the screw was confirmed under C-arm fluoroscopy.  The remaining medial bone shelf was resected using a power of bone saw.  Attention was directed to the first interspace via the original skin incision where the tendon of the adductor hallucis muscle was identified and transected as attachment to the base of the proximal phalanx.  The block was augmented using 1% lidocaine plain.  Lateral contracture on the hallux was noted to be reduced.  The wound was irrigated with copious amounts of sterile irrigant.  The periosteal and capsular structures were reapproximated using 4-0 Vicryl.  The medial capsulorrhaphy was then performed in the first metatarsophalangeal joint utilizing 3-0 Vicryl. Subcutaneous structures were reapproximated using 4-0 Vicryl.  Skin was reapproximated using 5-0 Monocryl.  Skin was reinforced with Steri- Strips.  A sterile compressive dressing was then applied to the operative foot.  The pneumatic ankle tourniquet was deflated and a prompt hyperemic response was noted to all digits of the operative foot. The patient tolerated the procedure well.  The patient was transferred from the operating room to the postanesthesia care unit with vital signs stable and vascular status intact  to all digits of the operative foot.          ______________________________ B. Theola Sequin, DPM     BIM/MEDQ  D:  09/29/2011  T:  09/30/2011  Job:  [id number]

## 2011-11-21 ENCOUNTER — Other Ambulatory Visit: Payer: Self-pay | Admitting: Adult Health

## 2011-11-21 ENCOUNTER — Other Ambulatory Visit (HOSPITAL_COMMUNITY)
Admission: RE | Admit: 2011-11-21 | Discharge: 2011-11-21 | Disposition: A | Discharge status: Home / Self Care | Payer: Medicare Other | Source: Ambulatory Visit | Attending: Obstetrics and Gynecology | Admitting: Obstetrics and Gynecology

## 2011-11-21 DIAGNOSIS — Z124 Encounter for screening for malignant neoplasm of cervix: Secondary | ICD-10-CM | POA: Insufficient documentation

## 2012-01-18 ENCOUNTER — Ambulatory Visit (INDEPENDENT_AMBULATORY_CARE_PROVIDER_SITE_OTHER): Payer: Medicare Other | Admitting: Orthopedic Surgery

## 2012-01-18 ENCOUNTER — Encounter: Payer: Self-pay | Admitting: Orthopedic Surgery

## 2012-01-18 VITALS — BP 116/68 | Ht 64.5 in | Wt 216.0 lb

## 2012-01-18 DIAGNOSIS — G5600 Carpal tunnel syndrome, unspecified upper limb: Secondary | ICD-10-CM

## 2012-01-18 MED ORDER — GABAPENTIN 100 MG PO CAPS: 100.0000 mg | ORAL_CAPSULE | Freq: Three times a day (TID) | ORAL | Status: DC

## 2012-01-18 NOTE — Patient Instructions (Addendum)
SPLINTS AT NIGHT X 6 WEEKS   B6 VITAMIN 100 MG 2 TIMES A DAY   NEURONTIN 100 MG 3 TIMES A DAY

## 2012-01-18 NOTE — Progress Notes (Signed)
Subjective:    Patient ID: [name], female    DOB: September 27, 1950, 61 y.o.   MRN: [id number] Chief Complaint  Patient presents with  . Numbness    bilateral hand numbness and tingling  x 1 year, gradual onset, no known injury    Bilateral RIGHT greater than LEFT hand numbness x1 year HPI Plan a 61 year old female with no risk factors, such as diabetes or thyroid disease, who presents with bilateral carpal tunnel like symptoms, RIGHT worse than LEFT. Times a year with no real treatment other than some over-the-counter braces. She complains of numbness and tingling and swelling with weakness and difficulty with fine motor activities. Her pain is worse at night. She has to shake her hands at night. Once the recurrent.  Review of systems as follows fatigue, blurred vision, chest pain, shortness of breath, joint tightness of the chest, constipation, anxiety, depression, excessive thirst.  Past Medical History  Diagnosis Date  . High cholesterol   . Arthritis   . Anxiety   . Carpal tunnel syndrome on both sides     Past Surgical History  Procedure Date  . Knee surgery   . Laminectomy   . Appendectomy   . Joint replacement 2008    bilateral knees, [facility]; Jisele Price  . Bunionectomy 09/29/2011    Procedure: [name];  Surgeon: Dallas Schimke, DPM;  Location: [facility];  Service: Orthopedics;  Laterality: Right;  Austin Bunionectomy Right Foot    [name] does not currently have medications on file.  Family History  Problem Relation Age of Onset  . Anesthesia problems Neg Hx   . Hypotension Neg Hx   . Malignant hyperthermia Neg Hx   . Pseudochol deficiency Neg Hx   . Cancer    . Diabetes    . Arthritis     History   Social History  . Marital Status: Married    Spouse Name: N/A    Number of Children: N/A  . Years of Education: N/A   Occupational History  . Not on file.   Social History Main Topics  . Smoking status: Former Games developer  . Smokeless tobacco: Former  Neurosurgeon    Quit date: 09/23/2010  . Alcohol Use: Yes     occasional wine  . Drug Use: No  . Sexually Active: Not on file   Other Topics Concern  . Not on file   Social History Narrative  . No narrative on file      Review of Systems     Objective:   Physical Exam  Constitutional: She is oriented to person, place, and time. She appears well-developed and well-nourished.  HENT:  Head: Normocephalic.  Neck: Normal range of motion.  Cardiovascular: Normal rate.   Pulmonary/Chest: Effort normal.  Abdominal: She exhibits no distension.  Neurological: She is alert and oriented to person, place, and time. She has normal strength. She displays no atrophy, no tremor and normal reflexes. No sensory deficit. She exhibits normal muscle tone. Coordination normal.       Tinel's tests were negative and compression tests were negative Phalen's tests were negative  Skin: Skin is warm and dry.  Psychiatric: She has a normal mood and affect. Her behavior is normal. Thought content normal.          Assessment & Plan:  Bilateral carpal tunnel syndrome.  Since the provocative tests were negative. We can try vitamin B6 100 mg twice a day, Neurontin, 100 mg 3 times a day, and wrist splints for  6 weeks if no improvement proceed with carpal tunnel decompressions

## 2012-01-23 ENCOUNTER — Telehealth: Payer: Self-pay | Admitting: *Deleted

## 2012-01-23 NOTE — Telephone Encounter (Signed)
Continue splinting for 6 weeks at this point. Use over-the-counter anti-inflammatory such as ibuprofen or Advil or Naprosyn

## 2012-01-24 NOTE — Telephone Encounter (Signed)
Patient aware.

## 2012-02-29 ENCOUNTER — Ambulatory Visit (INDEPENDENT_AMBULATORY_CARE_PROVIDER_SITE_OTHER): Payer: Medicare Other | Admitting: Orthopedic Surgery

## 2012-02-29 ENCOUNTER — Encounter: Payer: Self-pay | Admitting: Orthopedic Surgery

## 2012-02-29 VITALS — BP 110/70 | Ht 64.5 in | Wt 212.0 lb

## 2012-02-29 DIAGNOSIS — G5600 Carpal tunnel syndrome, unspecified upper limb: Secondary | ICD-10-CM

## 2012-02-29 DIAGNOSIS — G5602 Carpal tunnel syndrome, left upper limb: Secondary | ICD-10-CM | POA: Insufficient documentation

## 2012-02-29 NOTE — Progress Notes (Signed)
Subjective:     Patient ID: [name], female   DOB: Aug 30, 1950, 61 y.o.   MRN: [id number] Chief Complaint  Patient presents with  . Follow-up    follow up carpal tunnel   HPI Improved with splints  Pain primarily at night   Daytime pain activity related   Review of Systems Normal     Objective:   Physical Exam     Assessment:     CTS controlled with splints     Plan:     Call us if pain becomes un bearible   Continue splints

## 2012-02-29 NOTE — Patient Instructions (Addendum)
Continue splints.

## 2012-03-28 ENCOUNTER — Encounter: Payer: Self-pay | Admitting: Orthopedic Surgery

## 2012-03-28 ENCOUNTER — Ambulatory Visit (INDEPENDENT_AMBULATORY_CARE_PROVIDER_SITE_OTHER): Payer: Medicare Other | Admitting: Orthopedic Surgery

## 2012-03-28 VITALS — BP 112/60 | Ht 64.5 in | Wt 212.0 lb

## 2012-03-28 DIAGNOSIS — G5600 Carpal tunnel syndrome, unspecified upper limb: Secondary | ICD-10-CM

## 2012-03-28 NOTE — Progress Notes (Signed)
Patient ID: [name], female   DOB: 01/16/1951, 61 y.o.   MRN: [id number] Chief Complaint  Patient presents with  . Hand Pain    increased hand numbness and pain, discuss CTR    Treated for carpal tunnel syndrome at bracing and oral medications has not improved especially on the right side with increased pain numbness of the index long ring and small finger which is worse when her braces not used.  She is agreed to proceed with carpal tunnel release  Right upper extremity pain paresthesias numbness and tingling which is become severe although it is intermittent it is becoming more constant and exacerbated at night and without bracing. Systems review no changes from initial evaluation. History is recorded. Exam shows no swelling over the carpal tunnel with mild tenderness there performed shows no tenderness range of motion is normal strength is normal skin is normal pulses normal color normal sensation is abnormal with median nerve distribution sensory changes  Impression carpal tunnel syndrome  Preop for carpal tunnel release of the right upper extremity   Recurrence of symptoms.You have been scheduled for surgery.  All surgeries carry some risk.  Remember you always have the option of continued nonsurgical treatment. However in this situation the risks vs. the benefits favor surgery as the best treatment option. The risks of the surgery includes the following but is not limited to bleeding, infection, pulmonary embolus, death from anesthesia, nerve injury vascular injury or need for further surgery, continued pain.  Specific to this procedure the following risks and complications are rare but possible Incisional tenderness for approximately 6 months, recurrent symptoms persistent numbness and tingling. Incisional tenderness up to 6 months, persistent numbness and tingling. Weakness.

## 2012-03-28 NOTE — Patient Instructions (Addendum)
Surgery : right carpal tunnel release : [facility] SEPT 20[facility]  You have been scheduled for surgery  Please Go to your preoperative appointment and bring the folder that was given to you today  Please stop all blood thinners ibuprofen Naprosyn aspirin Plavix Coumadin  Carpal Tunnel Surgery The carpal tunnel is a narrow hollow area in the wrist. It is formed by the wrist bones and ligaments. Nerves, blood vessels, and tendons on the palm side of your hand pass through the carpal tunnel. (The palm side is the side of your hand in the direction your fingers bend.) Repeated wrist motion or certain diseases may cause swelling within the tunnel. That is why these are sometimes called repetitive trauma disorders. It is also a common problem in late pregnancy because of water retention. This swelling pinches the main nerve in the wrist (median nerve). It causes the painful condition called carpal tunnel syndrome. A feeling of "pins and needles" may be noticed in the fingers or hand. The entire arm may ache from this condition. Carpal tunnel syndrome may clear up by itself. Cortisone injections may help. An electromyogram may be needed to confirm this diagnosis. This is a test which measures nerve conduction. The nerve conduction is usually slowed in a carpal tunnel syndrome. Sometimes, an operation may be needed to free the pinched nerve.   LET YOUR CAREGIVER KNOW ABOUT:  Allergies   Medications taken including herbs, eye drops, over the counter medications, and creams   Use of steroids (by mouth or creams)   Previous problems with anesthetics or novocaine   Possibility of pregnancy, if this applies   History of blood clots (thrombophlebitis)   History of bleeding or blood problems   Previous surgery   Other health problems  RISKS AND COMPLICATIONS  Infection: A germ starts growing in the wound. This can usually be treated with antibiotics.   Damage to other organs may occur.   Bleeding following  surgery can be a complication of almost all surgeries. Your surgeon takes every precaution to keep this from happening.   Recurrence (return) of carpal tunnel syndrome following treatment is rare.  BEFORE THE PROCEDURE  Stop smoking at least two weeks prior to surgery. This lowers risk during surgery.   Stop non steroidal medicine for ten days prior to surgery. Also, do not take aspirin unless OK'd by your surgeon.   Your caregiver may tell you to stop taking certain medicine that may affect the outcome of the surgery and your ability to heal. For example, you may need to stop taking anti-inflammatories, such as aspirin, because of possible bleeding problems. Other medicine may have interactions with anesthesia.   BE SURE TO LET YOUR CAREGIVER KNOW IF YOU HAVE BEEN ON STEROIDS (INCLUDING CREAMS) FOR LONG PERIODS OF TIME. THIS IS CRITICAL.   Your caregiver will discuss possible risks and complications with you before surgery. In addition to the usual risks of anesthesia, other common risks and complications include:   Temporary increase in pain due to surgery.   Uncorrected pain.   Infection.  You should be present 60 minutes before your procedure or as directed.   PROCEDURE  Carpal tunnel release is generally recommended if symptoms last for 6 months. Surgery involves severing the band of tissue around the wrist to reduce pressure on the median nerve. Surgery is done under local anesthesia and does not require an overnight hospital stay. Many patients require surgery on both hands. The following are types of carpal tunnel release surgery:  Open release surgery, the traditional procedure used to correct carpal tunnel syndrome, consists of making an incision up to 2 inches in the wrist and then cutting the carpal ligament to enlarge the carpal tunnel. The procedure is generally done under local anesthesia on an outpatient basis, unless there are unusual medical considerations.   Endoscopic  surgery may allow faster functional recovery and less post-operative discomfort than traditional open release surgery. The surgeon makes two incisions (cuts) (about 1/2 inch each) in the wrist and palm, inserts a camera attached to a tube, looks at the tissue on a screen, and cuts the carpal ligament (the tissue that holds joints together). This two-portal endoscopic surgery, generally performed under local anesthesia, is effective and minimizes scarring and scar tenderness, if any. One-portal endoscopic surgery for carpal tunnel syndrome is also available.  Although symptoms may be better right after surgery, full recovery from carpal tunnel surgery can take months. Some patients may have infection, nerve damage, stiffness, and pain at the scar. Sometimes the wrist loses strength because the carpal ligament is cut. Patients should take part in physical therapy after surgery to restore wrist strength. Some patients may need to adjust job duties or even change jobs after recovery from surgery. The majority of patients recover completely without complications (additional problems). AFTER THE PROCEDURE After surgery, you will be taken to the recovery area where a nurse will watch and check your progress. Once you're awake, stable, and taking fluids well, without other problems you will be allowed to go home. HOME CARE INSTRUCTIONS    Once at home, an ice pack applied to your operative site may help with discomfort and keep the swelling down.   Follow your caregiver's instructions as to activities, exercises, physical therapy, and driving a car.   Maintain strength and range of motion as instructed.   If you were given a splint to keep your wrist from bending, use it as instructed. It is important to wear the splint at night. Use the splint for as long as you have pain or numbness in your hand, arm or wrist. This may take 1 to 2 months.   If you have pain at night, it may help to elevate your hand above  the level of your heart (the center of your chest).   It is important to avoid activities which originally caused your carpal tunnel syndrome for a couple weeks following surgery, or as directed by your surgeon. If your symptoms are work-related, you may need to talk to your employer about changing to a job that does not require using your wrist.   Only take over-the-counter or prescription medicines for pain, discomfort, or fever as directed by your caregiver.   Following periods of extended use, particularly hard (strenuous) use, apply an ice pack wrapped in a towel to the palm (anterior) side of the affected wrist for 20 to 30 minutes. Repeat as needed three to four times per day. This will help reduce swelling following surgery.  SEEK MEDICAL CARE IF:    There is increased bleeding (more than a small spot) from the wound.   You notice redness, swelling, or increasing pain in the wound.   Pus is coming from wound.   An unexplained oral temperature above 102 F (38.9 C) develops.   You notice a foul smell coming from the wound or dressing.  SEEK IMMEDIATE MEDICAL CARE IF:    You develop a rash.   You have difficulty breathing.   You have any  problems you think are related to allergies.  Document Released: 02/23/2004 Document Revised: 06/30/2011 Document Reviewed: 05/17/2007 [facility] Patient Information 2012 [place], [place].

## 2012-03-29 ENCOUNTER — Encounter (HOSPITAL_COMMUNITY): Payer: Self-pay | Admitting: Pharmacy Technician

## 2012-03-30 NOTE — Patient Instructions (Addendum)
20 FLORIE CARICO  03/30/2012   Your procedure is scheduled on:  Friday, 04/13/12  [facility] to [facility] [place] [facility].  Call this number if you have problems the morning of surgery: [phone]   Remember:   Do not eat food:After Midnight.  May have clear liquids:until Midnight .  Clear liquids include soda, tea, black coffee, apple or grape juice, broth.  Take these medicines the morning of surgery with A SIP OF WATER: xanax if needed   Do not wear jewelry, make-up or nail polish.  Do not wear lotions, powders, or perfumes. You may wear deodorant.  Do not shave 48 hours prior to surgery. Men may shave face and neck.  Do not bring valuables to the hospital.  Contacts, dentures or bridgework may not be worn into surgery.  Leave suitcase in the car. After surgery it may be brought to your room.  For patients admitted to the hospital, checkout time is 11:00 AM the day of discharge.   Patients discharged the day of surgery will not be allowed to drive home.  Name and phone number of your driver: family  Special Instructions: CHG Shower Use Special Wash: 1/2 bottle night before surgery and 1/2 bottle morning of surgery.   Please read over the following fact sheets that you were given: Pain Booklet, MRSA Information, Surgical Site Infection Prevention, Anesthesia Post-op Instructions and Care and Recovery After Surgery  Carpal Tunnel Release (Repair) Care After Refer to this sheet in the next few weeks. These discharge instructions provide you with general information on caring for yourself after you leave the hospital. Your caregiver may also give you specific instructions. Your treatment has been planned according to the most current medical practices available, but unavoidable complications sometimes occur. If you have any problems or questions after discharge, please call your caregiver. HOME CARE INSTRUCTIONS   Have a responsible person with you for 24 hours.   Do not drive a car or take  public transportation for 24 hours.   Only take over-the-counter or prescription medicines for pain, discomfort, or fever as directed by your caregiver. Take them as directed.   You may put ice on the palm side of the affected wrist.   Put ice in a plastic bag.   Place a towel between your skin and the bag.   Leave the ice on for 15 to 20 minutes, 3 to 4 times per day.   If you were given a splint to keep your wrist from bending, use it as directed. It is important to wear the splint at night or as directed. Use the splint for as long as you have pain or numbness in your hand, arm, or wrist. This may take 1 to 2 months.   Keep your hand raised (elevated) above the level of your heart as much as possible. This keeps swelling down and helps with discomfort.   Change bandages (dressings) as directed.   Keep the wound clean and dry.  SEEK MEDICAL CARE IF:   You develop pain not relieved with medicines.   You develop numbness of your hand.   You develop bleeding from your surgical site.   You have an oral temperature above 102 F (38.9 C).   You develop redness or swelling of the surgical site.   You develop new, unexplained problems.  SEEK IMMEDIATE MEDICAL CARE IF:   You develop a rash.   You have difficulty breathing.   You develop any reaction or side effects to  medicines given.  MAKE SURE YOU:   Understand these instructions.   Will watch your condition.   Will get help right away if you are not doing well or get worse.  Document Released: 01/28/2005 Document Revised: 06/30/2011 Document Reviewed: 05/17/2007 [facility] Patient Information 2012 [place], [place].Carpal Tunnel Surgery The carpal tunnel is a narrow hollow area in the wrist. It is formed by the wrist bones and ligaments. Nerves, blood vessels, and tendons on the palm side of your hand pass through the carpal tunnel. (The palm side is the side of your hand in the direction your fingers bend.) Repeated wrist  motion or certain diseases may cause swelling within the tunnel. That is why these are sometimes called repetitive trauma disorders. It is also a common problem in late pregnancy because of water retention. This swelling pinches the main nerve in the wrist (median nerve). It causes the painful condition called carpal tunnel syndrome. A feeling of "pins and needles" may be noticed in the fingers or hand. The entire arm may ache from this condition. Carpal tunnel syndrome may clear up by itself. Cortisone injections may help. An electromyogram may be needed to confirm this diagnosis. This is a test which measures nerve conduction. The nerve conduction is usually slowed in a carpal tunnel syndrome. Sometimes, an operation may be needed to free the pinched nerve.  LET YOUR CAREGIVER KNOW ABOUT:  Allergies   Medications taken including herbs, eye drops, over the counter medications, and creams   Use of steroids (by mouth or creams)   Previous problems with anesthetics or novocaine   Possibility of pregnancy, if this applies   History of blood clots (thrombophlebitis)   History of bleeding or blood problems   Previous surgery   Other health problems  RISKS AND COMPLICATIONS  Infection: A germ starts growing in the wound. This can usually be treated with antibiotics.   Damage to other organs may occur.   Bleeding following surgery can be a complication of almost all surgeries. Your surgeon takes every precaution to keep this from happening.   Recurrence (return) of carpal tunnel syndrome following treatment is rare.  BEFORE THE PROCEDURE  Stop smoking at least two weeks prior to surgery. This lowers risk during surgery.   Stop non steroidal medicine for ten days prior to surgery. Also, do not take aspirin unless OK'd by your surgeon.   Your caregiver may tell you to stop taking certain medicine that may affect the outcome of the surgery and your ability to heal. For example, you may need  to stop taking anti-inflammatories, such as aspirin, because of possible bleeding problems. Other medicine may have interactions with anesthesia.   BE SURE TO LET YOUR CAREGIVER KNOW IF YOU HAVE BEEN ON STEROIDS (INCLUDING CREAMS) FOR LONG PERIODS OF TIME. THIS IS CRITICAL.   Your caregiver will discuss possible risks and complications with you before surgery. In addition to the usual risks of anesthesia, other common risks and complications include:   Temporary increase in pain due to surgery.   Uncorrected pain.   Infection.  You should be present 60 minutes before your procedure or as directed.  PROCEDURE  Carpal tunnel release is generally recommended if symptoms last for 6 months. Surgery involves severing the band of tissue around the wrist to reduce pressure on the median nerve. Surgery is done under local anesthesia and does not require an overnight hospital stay. Many patients require surgery on both hands. The following are types of carpal tunnel release  surgery:   Open release surgery, the traditional procedure used to correct carpal tunnel syndrome, consists of making an incision up to 2 inches in the wrist and then cutting the carpal ligament to enlarge the carpal tunnel. The procedure is generally done under local anesthesia on an outpatient basis, unless there are unusual medical considerations.   Endoscopic surgery may allow faster functional recovery and less post-operative discomfort than traditional open release surgery. The surgeon makes two incisions (cuts) (about 1/2 inch each) in the wrist and palm, inserts a camera attached to a tube, looks at the tissue on a screen, and cuts the carpal ligament (the tissue that holds joints together). This two-portal endoscopic surgery, generally performed under local anesthesia, is effective and minimizes scarring and scar tenderness, if any. One-portal endoscopic surgery for carpal tunnel syndrome is also available.  Although symptoms  may be better right after surgery, full recovery from carpal tunnel surgery can take months. Some patients may have infection, nerve damage, stiffness, and pain at the scar. Sometimes the wrist loses strength because the carpal ligament is cut. Patients should take part in physical therapy after surgery to restore wrist strength. Some patients may need to adjust job duties or even change jobs after recovery from surgery. The majority of patients recover completely without complications (additional problems). AFTER THE PROCEDURE After surgery, you will be taken to the recovery area where a nurse will watch and check your progress. Once you're awake, stable, and taking fluids well, without other problems you will be allowed to go home. HOME CARE INSTRUCTIONS   Once at home, an ice pack applied to your operative site may help with discomfort and keep the swelling down.   Follow your caregiver's instructions as to activities, exercises, physical therapy, and driving a car.   Maintain strength and range of motion as instructed.   If you were given a splint to keep your wrist from bending, use it as instructed. It is important to wear the splint at night. Use the splint for as long as you have pain or numbness in your hand, arm or wrist. This may take 1 to 2 months.   If you have pain at night, it may help to elevate your hand above the level of your heart (the center of your chest).   It is important to avoid activities which originally caused your carpal tunnel syndrome for a couple weeks following surgery, or as directed by your surgeon. If your symptoms are work-related, you may need to talk to your employer about changing to a job that does not require using your wrist.   Only take over-the-counter or prescription medicines for pain, discomfort, or fever as directed by your caregiver.   Following periods of extended use, particularly hard (strenuous) use, apply an ice pack wrapped in a towel to  the palm (anterior) side of the affected wrist for 20 to 30 minutes. Repeat as needed three to four times per day. This will help reduce swelling following surgery.  SEEK MEDICAL CARE IF:   There is increased bleeding (more than a small spot) from the wound.   You notice redness, swelling, or increasing pain in the wound.   Pus is coming from wound.   An unexplained oral temperature above 102 F (38.9 C) develops.   You notice a foul smell coming from the wound or dressing.  SEEK IMMEDIATE MEDICAL CARE IF:   You develop a rash.   You have difficulty breathing.   You have any  problems you think are related to allergies.  Document Released: 02/23/2004 Document Revised: 06/30/2011 Document Reviewed: 05/17/2007 [facility] Patient Information 2012 [place], [place].

## 2012-04-02 ENCOUNTER — Encounter (HOSPITAL_COMMUNITY)
Admission: RE | Admit: 2012-04-02 | Discharge: 2012-04-02 | Disposition: A | Discharge status: Home / Self Care | Payer: Medicare Other | Source: Ambulatory Visit | Attending: Orthopedic Surgery | Admitting: Orthopedic Surgery

## 2012-04-02 ENCOUNTER — Encounter (HOSPITAL_COMMUNITY): Payer: Self-pay

## 2012-04-02 LAB — CBC
Hemoglobin: 12.8 g/dL (ref 12.0–15.0)
MCH: 30.5 pg (ref 26.0–34.0)
RBC: 4.19 MIL/uL (ref 3.87–5.11)

## 2012-04-02 LAB — BASIC METABOLIC PANEL
CO2: 27 mEq/L (ref 19–32)
Calcium: 10.2 mg/dL (ref 8.4–10.5)
Chloride: 99 mEq/L (ref 96–112)
Glucose, Bld: 127 mg/dL — ABNORMAL HIGH (ref 70–99)
Potassium: 4.1 mEq/L (ref 3.5–5.1)
Sodium: 136 mEq/L (ref 135–145)

## 2012-04-02 LAB — SURGICAL PCR SCREEN: MRSA, PCR: NEGATIVE

## 2012-04-02 MED ORDER — CHLORHEXIDINE GLUCONATE 4 % EX LIQD: 60.0000 mL | Freq: Once | CUTANEOUS | Status: DC

## 2012-04-04 ENCOUNTER — Telehealth: Payer: Self-pay | Admitting: Family Medicine

## 2012-04-09 ENCOUNTER — Other Ambulatory Visit: Payer: Self-pay | Admitting: Obstetrics and Gynecology

## 2012-04-09 DIAGNOSIS — Z139 Encounter for screening, unspecified: Secondary | ICD-10-CM

## 2012-04-12 NOTE — H&P (Signed)
Chief complaint pain and paresthesias right upper extremity  History  This is a 61 year old white female with a history of pain paresthesias in the right upper extremity which was originally treated with Neurontin, vitamin B6 and splinting. She got some initial side effects from Neurontin and basically continue with vitamin B6 and splinting. Initially she was able to obtain some relief but over time her symptoms have returned and she wishes to proceed with carpal tunnel release.  Past Medical History  Diagnosis Date  . High cholesterol   . Arthritis   . Anxiety   . Carpal tunnel syndrome on both sides    Past Surgical History  Procedure Date  . Knee surgery   . Laminectomy   . Appendectomy   . Joint replacement 2008    bilateral knees, [facility]; [place]  . Bunionectomy 09/29/2011    Procedure: [name];  Surgeon: Dallas Schimke, DPM;  Location: [facility];  Service: Orthopedics;  Laterality: Right;  [place] Bunionectomy Right Foot   No current facility-administered medications on file prior to encounter.   Current Outpatient Prescriptions on File Prior to Encounter  Medication Sig Dispense Refill  . ALPRAZolam (XANAX) 1 MG tablet Take 1 mg by mouth at bedtime as needed. For sleep      . aspirin EC 81 MG tablet Take 81 mg by mouth daily.      Marland Kitchen docusate sodium (COLACE) 100 MG capsule Take 100 mg by mouth 2 (two) times daily.      Marland Kitchen ibuprofen (ADVIL,MOTRIN) 200 MG tablet Take 400 mg by mouth every 6 (six) hours as needed. For pain      . simvastatin (ZOCOR) 40 MG tablet Take 40 mg by mouth every evening.       History   Social History  . Marital Status: Married    Spouse Name: N/A    Number of Children: N/A  . Years of Education: N/A   Social History Main Topics  . Smoking status: Former Games developer  . Smokeless tobacco: Former Neurosurgeon    Quit date: 09/23/2010  . Alcohol Use: Yes     occasional wine  . Drug Use: No  . Sexually Active: Not on file   Other Topics Concern  .  Not on file   Social History Narrative  . No narrative on file   Family History  Problem Relation Age of Onset  . Anesthesia problems Neg Hx   . Hypotension Neg Hx   . Malignant hyperthermia Neg Hx   . Pseudochol deficiency Neg Hx   . Cancer    . Diabetes    . Arthritis    Review of Systems  Constitutional: Negative.   HENT: Negative.   Eyes: Negative.   Respiratory: Negative.   Cardiovascular: Negative.   Gastrointestinal: Negative.   Genitourinary: Negative.   Musculoskeletal: Positive for myalgias, back pain and joint pain. Negative for falls.  Skin: Negative.   Neurological: Positive for tingling. Negative for tremors, sensory change, speech change, focal weakness and seizures.  Endo/Heme/Allergies: Negative.   Psychiatric/Behavioral: Negative for suicidal ideas, hallucinations and substance abuse.   Physical exam  Vital signs in the office were stable Gen. appearance was normal Orientation x3 normal Mood and affect normal Ambulation normal no assistive devices  Radial and ulnar pulse in the right upper extremity normal skin normal no lymphadenopathy decreased sensation is noted in the median nerve distribution. Reflexes were normal balance was normal. Wrist joint stable. Strength was abnormal with a poor grip strength. Range of  motion was full tenderness was over the carpal tunnel of the right upper extremity and was a positive Phalen's test.  The right left lower extremities were normal and the left upper extremity was normal  Right carpal tunnel syndrome  Right carpal tunnel release

## 2012-04-13 ENCOUNTER — Encounter (HOSPITAL_COMMUNITY): Payer: Self-pay | Admitting: Anesthesiology

## 2012-04-13 ENCOUNTER — Ambulatory Visit (HOSPITAL_COMMUNITY): Payer: Medicare Other | Admitting: Anesthesiology

## 2012-04-13 ENCOUNTER — Ambulatory Visit (HOSPITAL_COMMUNITY)
Admission: RE | Admit: 2012-04-13 | Discharge: 2012-04-13 | Disposition: A | Discharge status: Home / Self Care | Payer: Medicare Other | Source: Ambulatory Visit | Attending: Orthopedic Surgery | Admitting: Orthopedic Surgery

## 2012-04-13 ENCOUNTER — Encounter (HOSPITAL_COMMUNITY): Payer: Self-pay

## 2012-04-13 ENCOUNTER — Encounter (HOSPITAL_COMMUNITY)
Admission: RE | Disposition: A | Discharge status: Home / Self Care | Payer: Self-pay | Source: Ambulatory Visit | Attending: Orthopedic Surgery

## 2012-04-13 DIAGNOSIS — Z01812 Encounter for preprocedural laboratory examination: Secondary | ICD-10-CM | POA: Insufficient documentation

## 2012-04-13 DIAGNOSIS — G5600 Carpal tunnel syndrome, unspecified upper limb: Secondary | ICD-10-CM

## 2012-04-13 HISTORY — PX: CARPAL TUNNEL RELEASE: SHX101

## 2012-04-13 SURGERY — CARPAL TUNNEL RELEASE
Anesthesia: Regional | Site: Hand | Laterality: Right | Wound class: Clean

## 2012-04-13 MED ORDER — PROPOFOL 10 MG/ML IV EMUL
INTRAVENOUS | Status: AC
  Filled 2012-04-13: qty 20

## 2012-04-13 MED ORDER — CEFAZOLIN SODIUM-DEXTROSE 2-3 GM-% IV SOLR: 2.0000 g | INTRAVENOUS | Status: DC

## 2012-04-13 MED ORDER — MIDAZOLAM HCL 2 MG/2ML IJ SOLN
INTRAMUSCULAR | Status: AC
  Filled 2012-04-13: qty 2

## 2012-04-13 MED ORDER — LACTATED RINGERS IV SOLN
INTRAVENOUS | Status: DC
  Administered 2012-04-13: 07:00:00 via INTRAVENOUS

## 2012-04-13 MED ORDER — SODIUM CHLORIDE 0.9 % IR SOLN
Status: DC | PRN
  Administered 2012-04-13: 1000 mL

## 2012-04-13 MED ORDER — FENTANYL CITRATE 0.05 MG/ML IJ SOLN
INTRAMUSCULAR | Status: AC
  Filled 2012-04-13: qty 2

## 2012-04-13 MED ORDER — BUPIVACAINE HCL (PF) 0.5 % IJ SOLN
INTRAMUSCULAR | Status: DC | PRN
  Administered 2012-04-13: 15 mL

## 2012-04-13 MED ORDER — CEFAZOLIN SODIUM-DEXTROSE 2-3 GM-% IV SOLR
INTRAVENOUS | Status: AC
  Filled 2012-04-13: qty 50

## 2012-04-13 MED ORDER — FENTANYL CITRATE 0.05 MG/ML IJ SOLN: 25.0000 ug | INTRAMUSCULAR | Status: DC | PRN

## 2012-04-13 MED ORDER — HYDROCODONE-ACETAMINOPHEN 7.5-325 MG PO TABS: 1.0000 | ORAL_TABLET | ORAL | Status: DC | PRN

## 2012-04-13 MED ORDER — CEFAZOLIN SODIUM-DEXTROSE 2-3 GM-% IV SOLR
INTRAVENOUS | Status: DC | PRN
  Administered 2012-04-13: 2 g via INTRAVENOUS

## 2012-04-13 MED ORDER — MIDAZOLAM HCL 2 MG/2ML IJ SOLN
1.0000 mg | INTRAMUSCULAR | Status: DC | PRN
  Administered 2012-04-13 (×2): 2 mg via INTRAVENOUS

## 2012-04-13 MED ORDER — LIDOCAINE HCL (PF) 0.5 % IJ SOLN
INTRAMUSCULAR | Status: AC
  Filled 2012-04-13: qty 50

## 2012-04-13 MED ORDER — FENTANYL CITRATE 0.05 MG/ML IJ SOLN
INTRAMUSCULAR | Status: DC | PRN
  Administered 2012-04-13: 25 ug via INTRAVENOUS
  Administered 2012-04-13: 50 ug via INTRAVENOUS
  Administered 2012-04-13: 25 ug via INTRAVENOUS

## 2012-04-13 MED ORDER — ONDANSETRON HCL 4 MG/2ML IJ SOLN: 4.0000 mg | Freq: Once | INTRAMUSCULAR | Status: DC | PRN

## 2012-04-13 MED ORDER — BUPIVACAINE HCL (PF) 0.5 % IJ SOLN
INTRAMUSCULAR | Status: AC
  Filled 2012-04-13: qty 30

## 2012-04-13 MED ORDER — MIDAZOLAM HCL 5 MG/5ML IJ SOLN
INTRAMUSCULAR | Status: DC | PRN
  Administered 2012-04-13 (×2): 2 mg via INTRAVENOUS

## 2012-04-13 MED ORDER — PROPOFOL INFUSION 10 MG/ML OPTIME
INTRAVENOUS | Status: DC | PRN
  Administered 2012-04-13: 55 ug/kg/min via INTRAVENOUS

## 2012-04-13 MED ORDER — LIDOCAINE HCL (PF) 0.5 % IJ SOLN
INTRAMUSCULAR | Status: DC | PRN
  Administered 2012-04-13: 250 mg via INTRAVENOUS

## 2012-04-13 MED ORDER — SODIUM CHLORIDE 0.9 % IJ SOLN
INTRAMUSCULAR | Status: AC
  Filled 2012-04-13: qty 10

## 2012-04-13 MED ORDER — LIDOCAINE HCL (PF) 1 % IJ SOLN
INTRAMUSCULAR | Status: AC
  Filled 2012-04-13: qty 5

## 2012-04-13 SURGICAL SUPPLY — 34 items
BAG HAMPER (MISCELLANEOUS) ×2 IMPLANT
BANDAGE ELASTIC 3 VELCRO NS (GAUZE/BANDAGES/DRESSINGS) ×2 IMPLANT
BANDAGE ELASTIC 4 VELCRO NS (GAUZE/BANDAGES/DRESSINGS) ×2 IMPLANT
BANDAGE ESMARK 4X12 BL STRL LF (DISPOSABLE) ×1 IMPLANT
BANDAGE GAUZE ELAST BULKY 4 IN (GAUZE/BANDAGES/DRESSINGS) ×4 IMPLANT
BNDG COHESIVE 4X5 TAN NS LF (GAUZE/BANDAGES/DRESSINGS) ×2 IMPLANT
BNDG ESMARK 4X12 BLUE STRL LF (DISPOSABLE) ×2
CHLORAPREP W/TINT 26ML (MISCELLANEOUS) ×2 IMPLANT
CLOTH BEACON ORANGE TIMEOUT ST (SAFETY) ×2 IMPLANT
COVER LIGHT HANDLE STERIS (MISCELLANEOUS) ×4 IMPLANT
CUFF TOURNIQUET SINGLE 18IN (TOURNIQUET CUFF) ×2 IMPLANT
DECANTER SPIKE VIAL GLASS SM (MISCELLANEOUS) ×2 IMPLANT
DRSG XEROFORM 1X8 (GAUZE/BANDAGES/DRESSINGS) ×2 IMPLANT
ELECT NEEDLE TIP 2.8 STRL (NEEDLE) ×2 IMPLANT
ELECT REM PT RETURN 9FT ADLT (ELECTROSURGICAL) ×2
ELECTRODE REM PT RTRN 9FT ADLT (ELECTROSURGICAL) ×1 IMPLANT
GLOVE BIOGEL PI IND STRL 7.0 (GLOVE) ×1 IMPLANT
GLOVE BIOGEL PI INDICATOR 7.0 (GLOVE) ×1
GLOVE ECLIPSE 6.5 STRL STRAW (GLOVE) ×4 IMPLANT
GLOVE SKINSENSE NS SZ8.0 LF (GLOVE) ×1
GLOVE SKINSENSE STRL SZ8.0 LF (GLOVE) ×1 IMPLANT
GLOVE SS N UNI LF 8.5 STRL (GLOVE) ×2 IMPLANT
GOWN STRL REIN XL XLG (GOWN DISPOSABLE) ×4 IMPLANT
HAND ALUMI XLG (SOFTGOODS) ×2 IMPLANT
KIT ROOM TURNOVER APOR (KITS) ×2 IMPLANT
MANIFOLD NEPTUNE II (INSTRUMENTS) ×2 IMPLANT
NEEDLE HYPO 21X1.5 SAFETY (NEEDLE) ×2 IMPLANT
NS IRRIG 1000ML POUR BTL (IV SOLUTION) ×2 IMPLANT
PACK BASIC LIMB (CUSTOM PROCEDURE TRAY) ×2 IMPLANT
PAD ARMBOARD 7.5X6 YLW CONV (MISCELLANEOUS) ×2 IMPLANT
SET BASIN LINEN APH (SET/KITS/TRAYS/PACK) ×2 IMPLANT
SPONGE GAUZE 4X4 12PLY (GAUZE/BANDAGES/DRESSINGS) ×4 IMPLANT
SUT ETHILON 3 0 FSL (SUTURE) ×2 IMPLANT
SYR 30ML LL (SYRINGE) ×2 IMPLANT

## 2012-04-13 NOTE — Brief Op Note (Signed)
04/13/2012  8:13 AM  PATIENT:  [name]  61 y.o. female  PRE-OPERATIVE DIAGNOSIS:  right carpal tunnel syndrome  POST-OPERATIVE DIAGNOSIS:  right carpal tunnel syndrome  PROCEDURE:  Procedure(s) (LRB) with comments: CARPAL TUNNEL RELEASE (Right)  Details of procedure: The patient was identified in the preop holding area and the surgical site was marked and signed.  The history and physical update was completed.  Antibiotics were ordered and the patient was given antibiotics and taken to surgery.  In the surgical suite a Bier block was done and then the arm was prepped and draped in sterile technique.  At this point the timeout procedure was initiated and completed.  The incision was made over the carpal tunnel and extended to the palmar fascia.  The palmar fascia was divided bluntly and dissection was carried out to the distal aspect of the transverse carpal ligament.  Blunt dissection was carried out beneath the ligament and sharp dissection was used to release the ligament.  The contents of the carpal tunnel were inspected and found to be free of any other pathology.  Irrigation was performed.  Closure was with  3-0 nylon suture.  105cc of plain Sensorcaine was injected around the incision edges.  A sterile bandage was applied.  The tourniquet was released the color of the fingers was normal and the capillary refill was normal.  The patient is transferred to Recovery area and will be discharged home. SURGEON:  Surgeon(s) and Role:    * Vickki Hearing, MD - Primary  PHYSICIAN ASSISTANT:   ASSISTANTS: none   ANESTHESIA:   regional  EBL:  Total I/O In: 200 [I.V.:200] Out: -   BLOOD ADMINISTERED:none  DRAINS: none   LOCAL MEDICATIONS USED:  MARCAINE    and Amount: 15 ml  SPECIMEN:  No Specimen  DISPOSITION OF SPECIMEN:  N/A  COUNTS:  YES  TOURNIQUET:  * Missing tourniquet times found for documented tourniquets in log:  16109 *  DICTATION: .Dragon  Dictation  PLAN OF CARE: Discharge to home after PACU  PATIENT DISPOSITION:  PACU - hemodynamically stable.   Delay start of Pharmacological VTE agent (>24hrs) due to surgical blood loss or risk of bleeding: yes

## 2012-04-13 NOTE — Transfer of Care (Signed)
Immediate Anesthesia Transfer of Care Note  Patient: [name]  Procedure(s) Performed: Procedure(s) (LRB) with comments: CARPAL TUNNEL RELEASE (Right)  Patient Location: PACU  Anesthesia Type: Bier block  Level of Consciousness: awake, alert , oriented and patient cooperative  Airway & Oxygen Therapy: Patient Spontanous Breathing and Patient connected to face mask oxygen  Post-op Assessment: Report given to PACU RN, Post -op Vital signs reviewed and stable and Patient moving all extremities  Post vital signs: Reviewed and stable  Complications: No apparent anesthesia complications

## 2012-04-13 NOTE — Op Note (Signed)
04/13/2012  8:13 AM  PATIENT:  [name]  60 y.o. female  PRE-OPERATIVE DIAGNOSIS:  right carpal tunnel syndrome  POST-OPERATIVE DIAGNOSIS:  right carpal tunnel syndrome  PROCEDURE:  Procedure(s) (LRB) with comments: CARPAL TUNNEL RELEASE (Right)  Details of procedure: The patient was identified in the preop holding area and the surgical site was marked and signed.  The history and physical update was completed.  Antibiotics were ordered and the patient was given antibiotics and taken to surgery.  In the surgical suite a Bier block was done and then the arm was prepped and draped in sterile technique.  At this point the timeout procedure was initiated and completed.  The incision was made over the carpal tunnel and extended to the palmar fascia.  The palmar fascia was divided bluntly and dissection was carried out to the distal aspect of the transverse carpal ligament.  Blunt dissection was carried out beneath the ligament and sharp dissection was used to release the ligament.  The contents of the carpal tunnel were inspected and found to be free of any other pathology.  Irrigation was performed.  Closure was with  3-0 nylon suture.  105cc of plain Sensorcaine was injected around the incision edges.  A sterile bandage was applied.  The tourniquet was released the color of the fingers was normal and the capillary refill was normal.  The patient is transferred to Recovery area and will be discharged home. SURGEON:  Surgeon(s) and Role:    * Stanley E Harrison, MD - Primary  PHYSICIAN ASSISTANT:   ASSISTANTS: none   ANESTHESIA:   regional  EBL:  Total I/O In: 200 [I.V.:200] Out: -   BLOOD ADMINISTERED:none  DRAINS: none   LOCAL MEDICATIONS USED:  MARCAINE    and Amount: 15 ml  SPECIMEN:  No Specimen  DISPOSITION OF SPECIMEN:  N/A  COUNTS:  YES  TOURNIQUET:  * Missing tourniquet times found for documented tourniquets in log:  58072 *  DICTATION: .Dragon  Dictation  PLAN OF CARE: Discharge to home after PACU  PATIENT DISPOSITION:  PACU - hemodynamically stable.   Delay start of Pharmacological VTE agent (>24hrs) due to surgical blood loss or risk of bleeding: yes  

## 2012-04-13 NOTE — Anesthesia Procedure Notes (Signed)
Anesthesia Regional Block:  Bier block (IV Regional)  Pre-Anesthetic Checklist: ,,, Correct Patient, Correct Site, Correct Laterality, Correct Procedure, Correct Position, site marked, surgical consent,,  Laterality: Right     Needles:  Injection technique: Single-shot      Additional Needles:  Motor weakness within 2 minutes. Bier block (IV Regional) Narrative:  Resident/CRNA: Jiles Harold

## 2012-04-13 NOTE — Interval H&P Note (Signed)
History and Physical Interval Note:  04/13/2012 7:27 AM  [name]  has presented today for surgery, with the diagnosis of right carpal tunnel syndrome  The various methods of treatment have been discussed with the patient and family. After consideration of risks, benefits and other options for treatment, the patient has consented to  Procedure(s) (LRB) with comments: CARPAL TUNNEL RELEASE (Right) as a surgical intervention .  The patient's history has been reviewed, patient examined, no change in status, stable for surgery.  I have reviewed the patient's chart and labs.  Questions were answered to the patient's satisfaction.     [name]

## 2012-04-13 NOTE — Anesthesia Postprocedure Evaluation (Signed)
  Anesthesia Post-op Note  Patient: [name]  Procedure(s) Performed: Procedure(s) (LRB) with comments: CARPAL TUNNEL RELEASE (Right)  Patient Location: PACU  Anesthesia Type: Bier block  Level of Consciousness: awake, alert , oriented and patient cooperative  Airway and Oxygen Therapy: Patient Spontanous Breathing  Post-op Pain: 2 /10, mild  Post-op Assessment: Post-op Vital signs reviewed, Patient's Cardiovascular Status Stable, Respiratory Function Stable, Patent Airway, No signs of Nausea or vomiting and Pain level controlled  Post-op Vital Signs: Reviewed and stable  Complications: No apparent anesthesia complications

## 2012-04-13 NOTE — Anesthesia Preprocedure Evaluation (Signed)
Anesthesia Evaluation  Patient identified by MRN, date of birth, ID band Patient awake    Reviewed: Allergy & Precautions, H&P , NPO status , Patient's Chart, lab work & pertinent test results  History of Anesthesia Complications Negative for: history of anesthetic complications  Airway Mallampati: II      Dental  (+) Edentulous Upper and Edentulous Lower   Pulmonary neg pulmonary ROS,    Pulmonary exam normal       Cardiovascular negative cardio ROS  Rhythm:Regular Rate:Normal     Neuro/Psych Anxiety  Neuromuscular disease    GI/Hepatic   Endo/Other    Renal/GU      Musculoskeletal   Abdominal   Peds  Hematology   Anesthesia Other Findings   Reproductive/Obstetrics                           Anesthesia Physical Anesthesia Plan  ASA: II  Anesthesia Plan: Bier Block   Post-op Pain Management:    Induction: Intravenous  Airway Management Planned: Nasal Cannula  Additional Equipment:   Intra-op Plan:   Post-operative Plan:   Informed Consent: I have reviewed the patients History and Physical, chart, labs and discussed the procedure including the risks, benefits and alternatives for the proposed anesthesia with the patient or authorized representative who has indicated his/her understanding and acceptance.     Plan Discussed with:   Anesthesia Plan Comments:         Anesthesia Quick Evaluation

## 2012-04-16 ENCOUNTER — Encounter: Payer: Self-pay | Admitting: Orthopedic Surgery

## 2012-04-16 ENCOUNTER — Ambulatory Visit (INDEPENDENT_AMBULATORY_CARE_PROVIDER_SITE_OTHER): Payer: Medicare Other | Admitting: Orthopedic Surgery

## 2012-04-16 VITALS — BP 104/70 | Ht 64.5 in | Wt 209.0 lb

## 2012-04-16 DIAGNOSIS — Z9889 Other specified postprocedural states: Secondary | ICD-10-CM

## 2012-04-16 NOTE — Patient Instructions (Addendum)
Move fingers for exercises   Wear ace wrap for support

## 2012-04-16 NOTE — Progress Notes (Signed)
Patient ID: [name], female   DOB: Nov 16, 1950, 61 y.o.   MRN: [id number] Chief Complaint  Patient presents with  . Routine Post Op    post op 1, Right CTS, DOS 04/13/12     Postoperative visit  Procedure: (right   CTR)  Date of procedure  Diagnosis Carpal Tunnel Syndrome   Operative findings Median Nerve compression  Appearance of incision clean ROM GOOD  Patient complaints none  Plan AROM

## 2012-04-17 ENCOUNTER — Encounter (HOSPITAL_COMMUNITY): Payer: Self-pay | Admitting: Orthopedic Surgery

## 2012-04-24 ENCOUNTER — Ambulatory Visit (INDEPENDENT_AMBULATORY_CARE_PROVIDER_SITE_OTHER): Payer: Medicare Other | Admitting: Orthopedic Surgery

## 2012-04-24 ENCOUNTER — Encounter: Payer: Self-pay | Admitting: Orthopedic Surgery

## 2012-04-24 VITALS — BP 110/60 | Ht 64.5 in | Wt 209.0 lb

## 2012-04-24 DIAGNOSIS — G5600 Carpal tunnel syndrome, unspecified upper limb: Secondary | ICD-10-CM

## 2012-04-24 NOTE — Progress Notes (Signed)
Patient ID: [name], female   DOB: 1951/01/28, 61 y.o.   MRN: [id number] Chief Complaint  Patient presents with  . Routine Post Op    follow up Right wrist, DOI 04/13/12    Sutures remove wound intact   F/u 4 weeks

## 2012-04-24 NOTE — Patient Instructions (Signed)
Exercises as tolerated  

## 2012-05-14 ENCOUNTER — Ambulatory Visit (HOSPITAL_COMMUNITY)
Admission: RE | Admit: 2012-05-14 | Discharge: 2012-05-14 | Disposition: A | Discharge status: Home / Self Care | Payer: Medicare Other | Source: Ambulatory Visit | Attending: Obstetrics and Gynecology | Admitting: Obstetrics and Gynecology

## 2012-05-14 DIAGNOSIS — Z1231 Encounter for screening mammogram for malignant neoplasm of breast: Secondary | ICD-10-CM | POA: Insufficient documentation

## 2012-05-14 DIAGNOSIS — Z139 Encounter for screening, unspecified: Secondary | ICD-10-CM

## 2012-05-22 ENCOUNTER — Ambulatory Visit (INDEPENDENT_AMBULATORY_CARE_PROVIDER_SITE_OTHER): Payer: Medicare Other | Admitting: Orthopedic Surgery

## 2012-05-22 ENCOUNTER — Encounter: Payer: Self-pay | Admitting: Orthopedic Surgery

## 2012-05-22 VITALS — BP 100/64 | Ht 64.5 in | Wt 209.0 lb

## 2012-05-22 DIAGNOSIS — G5600 Carpal tunnel syndrome, unspecified upper limb: Secondary | ICD-10-CM

## 2012-05-22 DIAGNOSIS — Z9889 Other specified postprocedural states: Secondary | ICD-10-CM | POA: Insufficient documentation

## 2012-05-22 NOTE — Progress Notes (Signed)
Patient ID: [name], female   DOB: 1951/04/19, 61 y.o.   MRN: [id number] Chief Complaint  Patient presents with  . Follow-up    follow up right wrist CTR, DOS 04/13/12   1. CTS (carpal tunnel syndrome)   2. S/P carpal tunnel release    Doing well post release right CT  This week had some wrist pain mild paresthesias   Left side also an issue   She is considering L CTR in Dec   She will follow for preop

## 2012-05-22 NOTE — Patient Instructions (Addendum)
Part time splint right wrist

## 2012-06-27 NOTE — Telephone Encounter (Signed)
Per [employer], no preauth required for Sept 2013 surgery

## 2012-09-27 ENCOUNTER — Ambulatory Visit (INDEPENDENT_AMBULATORY_CARE_PROVIDER_SITE_OTHER): Payer: Medicare Other

## 2012-09-27 ENCOUNTER — Ambulatory Visit (INDEPENDENT_AMBULATORY_CARE_PROVIDER_SITE_OTHER): Payer: Medicare Other | Admitting: Orthopedic Surgery

## 2012-09-27 ENCOUNTER — Encounter: Payer: Self-pay | Admitting: Orthopedic Surgery

## 2012-09-27 VITALS — BP 124/78 | Ht 64.5 in | Wt 215.0 lb

## 2012-09-27 DIAGNOSIS — Z96653 Presence of artificial knee joint, bilateral: Secondary | ICD-10-CM

## 2012-09-27 DIAGNOSIS — Z96659 Presence of unspecified artificial knee joint: Secondary | ICD-10-CM

## 2012-09-27 NOTE — Progress Notes (Signed)
Patient ID: [name], female   DOB: 1950-11-07, 62 y.o.   MRN: [id number] Chief Complaint  Patient presents with  . Follow-up    Yearly recheck on bilateral knee replacements.    BP 124/78  Ht 5' 4.5" (1.638 m)  Wt 215 lb (97.523 kg)  BMI 36.35 kg/m2 Chief complaint total knee follow-up.  History this is a follow-up visit. Status post B/L total knee replacement. Location no pain in either knee replacement however she is having some pain below the knee after she was pulling on her mom who has been sick, she also had bilateral hip pain along the lateral thigh, mild back pain  Quality aching  Severity 7/10  Timing intermittent  Duration 1-2 weeks  Context associated with activity   DOS: 2008  Implant DePuy  Review of systems patient has C/O PAIN BOTH HIPS NO NUMBNESS OR TINGLING   Exam Physical Exam(12) GENERAL: normal development   CDV: pulses are normal   Skin: normal  Psychiatric: awake, alert and oriented  Neuro: normal sensation  1 ambulation NORMAL  2 ROM = 120RT AND 125 LEFT 3 Motor normal B/L 4 Stability normal B/L 5 NO SWELLING IN EITHER  Separate x-ray report.  Reason for x-ray, and we'll x-ray follow-up knee replacement.  3 views LEFT AND RIGHT  knee.  The implant is aligned normally. There is no loosening.  Impression normal appearing knee replacement.    Assessment: Knee replacement functioning well    Plan: One year follow

## 2012-09-27 NOTE — Patient Instructions (Signed)
No lifting

## 2013-04-15 ENCOUNTER — Other Ambulatory Visit: Payer: Self-pay | Admitting: Obstetrics and Gynecology

## 2013-04-15 DIAGNOSIS — Z139 Encounter for screening, unspecified: Secondary | ICD-10-CM

## 2013-05-16 ENCOUNTER — Ambulatory Visit (HOSPITAL_COMMUNITY)
Admission: RE | Admit: 2013-05-16 | Discharge: 2013-05-16 | Disposition: A | Discharge status: Home / Self Care | Payer: Medicare Other | Source: Ambulatory Visit | Attending: Obstetrics and Gynecology | Admitting: Obstetrics and Gynecology

## 2013-05-16 DIAGNOSIS — Z1231 Encounter for screening mammogram for malignant neoplasm of breast: Secondary | ICD-10-CM | POA: Insufficient documentation

## 2013-05-16 DIAGNOSIS — Z139 Encounter for screening, unspecified: Secondary | ICD-10-CM

## 2013-09-30 ENCOUNTER — Ambulatory Visit (HOSPITAL_COMMUNITY)
Admission: RE | Admit: 2013-09-30 | Discharge: 2013-09-30 | Disposition: A | Discharge status: Home / Self Care | Payer: Medicare Other | Source: Ambulatory Visit | Attending: Family Medicine | Admitting: Family Medicine

## 2013-09-30 ENCOUNTER — Other Ambulatory Visit (HOSPITAL_COMMUNITY): Payer: Self-pay | Admitting: Family Medicine

## 2013-09-30 DIAGNOSIS — M169 Osteoarthritis of hip, unspecified: Secondary | ICD-10-CM | POA: Insufficient documentation

## 2013-09-30 DIAGNOSIS — M25559 Pain in unspecified hip: Secondary | ICD-10-CM

## 2013-09-30 DIAGNOSIS — R109 Unspecified abdominal pain: Secondary | ICD-10-CM | POA: Insufficient documentation

## 2013-09-30 DIAGNOSIS — M1610 Unilateral primary osteoarthritis, unspecified hip: Secondary | ICD-10-CM | POA: Insufficient documentation

## 2013-09-30 DIAGNOSIS — M47817 Spondylosis without myelopathy or radiculopathy, lumbosacral region: Secondary | ICD-10-CM | POA: Insufficient documentation

## 2013-10-10 ENCOUNTER — Ambulatory Visit (INDEPENDENT_AMBULATORY_CARE_PROVIDER_SITE_OTHER): Payer: Medicare Other | Admitting: Orthopedic Surgery

## 2013-10-10 ENCOUNTER — Encounter: Payer: Self-pay | Admitting: Orthopedic Surgery

## 2013-10-10 ENCOUNTER — Ambulatory Visit (INDEPENDENT_AMBULATORY_CARE_PROVIDER_SITE_OTHER): Payer: Medicare Other

## 2013-10-10 VITALS — BP 132/77 | Ht 64.5 in | Wt 215.0 lb

## 2013-10-10 DIAGNOSIS — Z96659 Presence of unspecified artificial knee joint: Secondary | ICD-10-CM

## 2013-10-10 DIAGNOSIS — Z96652 Presence of left artificial knee joint: Secondary | ICD-10-CM

## 2013-10-10 DIAGNOSIS — M170 Bilateral primary osteoarthritis of knee: Secondary | ICD-10-CM | POA: Insufficient documentation

## 2013-10-10 DIAGNOSIS — Z96651 Presence of right artificial knee joint: Secondary | ICD-10-CM

## 2013-10-10 DIAGNOSIS — M1610 Unilateral primary osteoarthritis, unspecified hip: Secondary | ICD-10-CM

## 2013-10-10 DIAGNOSIS — M1710 Unilateral primary osteoarthritis, unspecified knee: Secondary | ICD-10-CM

## 2013-10-10 DIAGNOSIS — IMO0002 Reserved for concepts with insufficient information to code with codable children: Secondary | ICD-10-CM

## 2013-10-10 DIAGNOSIS — M169 Osteoarthritis of hip, unspecified: Secondary | ICD-10-CM

## 2013-10-10 NOTE — Patient Instructions (Signed)
Call to arrange therapy on hips at [facility]

## 2013-10-10 NOTE — Progress Notes (Signed)
Patient ID: Audrey FinchBarbara K Buehner, female   DOB: [birth year], 63 y.o.   MRN: [id number]  Chief Complaint  Patient presents with  . Follow-up    Yearly follow up bilateral knees   Encounter Diagnoses  Name Primary?  . Status post right knee replacement   . Status post left knee replacement   . Degenerative arthritis of knee, bilateral Yes  . Degenerative arthritis of hip     60 year follow up status post bilateral total knee is doing well but complains of hip and leg pain. Primary care doctor ordered x-rays of her hips and she has bilateral hip arthritis which is consistent with a complaint of groin pain decreased range of motion decreased activity requiring the PIP flexion. Seems to be progressive over several months to years now.  Neurologic symptoms none Musculoskeletal symptoms also some mild to moderate intermittent lumbar spine complaints  Vital signs: BP 132/77  Ht 5' 4.5" (1.638 m)  Wt 215 lb (97.523 kg)  BMI 36.35 kg/m2  General the patient is well-developed and well-nourished grooming and hygiene are normal Oriented x3 Mood and affect normal Ambulation normal  Inspection of the right and left knee reveals 2 well-healed anterior scars consistent with knee replacement. Knee flexion is approximately 115. Knee stability confirmed anteriorly and posteriorly and medial collateral. Extension power normal skin as stated distal neurovascular function intact  Decreased hip flexion and painful rotation  Knee x-ray show stable implants  Hip and pelvic x-rays show moderate arthritis both hips  Recommend physical therapy for the hips Ibuprofen for hip pain Continue Norco 10 mg which is a chronically used medication Avoid oxycodone Followup for yearly x-ray of the knees, x-ray hips as symptoms indicate

## 2013-10-14 ENCOUNTER — Ambulatory Visit (HOSPITAL_COMMUNITY)
Admission: RE | Admit: 2013-10-14 | Discharge: 2013-10-14 | Disposition: A | Discharge status: Home / Self Care | Payer: Medicare Other | Source: Ambulatory Visit | Attending: Orthopedic Surgery | Admitting: Orthopedic Surgery

## 2013-10-14 DIAGNOSIS — IMO0001 Reserved for inherently not codable concepts without codable children: Secondary | ICD-10-CM | POA: Insufficient documentation

## 2013-10-14 DIAGNOSIS — M25551 Pain in right hip: Secondary | ICD-10-CM

## 2013-10-14 DIAGNOSIS — M25552 Pain in left hip: Secondary | ICD-10-CM

## 2013-10-14 DIAGNOSIS — M25659 Stiffness of unspecified hip, not elsewhere classified: Secondary | ICD-10-CM | POA: Insufficient documentation

## 2013-10-14 DIAGNOSIS — M25559 Pain in unspecified hip: Secondary | ICD-10-CM | POA: Insufficient documentation

## 2013-10-14 NOTE — Evaluation (Signed)
Physical Therapy Evaluation  Patient Details  Name: [name] MRN: [id number] Date of Birth: 02/10/1951  Today's Date: 10/14/2013 Time: 0915-1015 PT Time Calculation (min): 60 min              Visit#: 1 of 12  Re-eval: 11/13/13 Assessment Diagnosis: b hip arthritis  Next MD Visit: [name]  Prior Therapy: no   Authorization: Medicare     Authorization Time Period:    Authorization Visit#: 1 of 12   Past Medical History:  Past Medical History  Diagnosis Date  . High cholesterol   . Arthritis   . Anxiety   . Carpal tunnel syndrome on both sides    Past Surgical History:  Past Surgical History  Procedure Laterality Date  . Knee surgery    . Laminectomy    . Appendectomy    . Joint replacement  2008    bilateral knees, [facility]; [name]  . Bunionectomy  09/29/2011    Procedure: Audrey LeasBUNIONECTOMY;  Surgeon: [name], DPM;  Location: [facility];  Service: Orthopedics;  Laterality: Right;  [place] Bunionectomy Right Foot  . Carpal tunnel release  04/13/2012    Procedure: CARPAL TUNNEL RELEASE;  Surgeon: Audrey HearingStanley E Caryle Helgeson, MD;  Location: [facility];  Service: Orthopedics;  Laterality: Right;    Subjective Symptoms/Limitations Symptoms: c/o B hip and groin  pain which intially started only on R side, latearal  hip pain also present , pain worsened with some treadmil walking for fitness , now pain with prolonged walking and sitting, pain worsening. has stopped walking program  Pertinent History: B knee replacements 2008 , onset of B   hip/thigh pain about 2-3 months ago around 07/26/2013  How long can you sit comfortably?: 20 -40 min  How long can you walk comfortably?: 15 min  Special Tests: hip x rays moderate arthritis  Patient Stated Goals: decrease pain, improve walking  Pain Assessment Currently in Pain?: Yes Pain Score: 5  Pain Location: Hip Pain Orientation: Right;Left Pain Type: Chronic pain Pain Onset: More than a month ago Pain Frequency: Intermittent Pain  Relieving Factors: ibprofen at night if needed  Effect of Pain on Daily Activities: less walking due to pain,  fear of falls recently   Precautions/Restrictions     Balance Screening Balance Screen Has the patient fallen in the past 6 months: Yes How many times?: 2  Has the patient had a decrease in activity level because of a fear of falling? : Yes Is the patient reluctant to leave their home because of a fear of falling? : No  Prior Functioning  Prior Function Level of Independence: Independent with basic ADLs;Independent with gait  Able to Take Stairs?: Yes Driving: Yes Vocation: On disability Leisure: Hobbies-yes (Comment) Comments: walking   Cognition/Observation Cognition Overall Cognitive Status: Within Functional Limits for tasks assessed Observation/Other Assessments Observations: no walking device  Other Assessments: gait: narrow base of support, gait speed WNL   Sensation/Coordination/Flexibility/Functional Tests Flexibility Obers: Positive Functional Tests Functional Tests: FOTO 46  Functional Tests: sit to stand no UE   Assessment RLE AROM (degrees) Right Hip Flexion: 110 Right Hip ABduction: 20 RLE PROM (degrees) Right Hip Flexion: 115 Right Hip ABduction: 30 RLE Strength Right Hip Flexion: 3/5 Right Hip ABduction: 4/5 Right Knee Flexion: 4/5 Right Knee Extension: 4/5 LLE AROM (degrees) Left Hip Flexion: 95 Left Hip ABduction: 30 LLE PROM (degrees) Left Hip Flexion: 115 Left Hip ABduction: 45 LLE Strength Left Hip Flexion: 3+/5 Left Hip ABduction: 4/5 Left Knee  Flexion: 4/5 Left Knee Extension: 4/5 Palpation Palpation: B tenderness hip greater trochanters   Exercise/Treatments Mobility/Balance  Static Standing Balance Single Leg Stance - Right Leg: 7 Single Leg Stance - Left Leg: 7 Tandem Stance - Right Leg: 10 Rhomberg - Eyes Opened: 60   Stretches Single Knee to Chest Stretch: 30 seconds;2 reps Piriformis Stretch: 2 reps;30  seconds  Manual Therapy Manual Therapy: Joint mobilization Joint Mobilization: B hip PROM gentlle all planes   Physical Therapy Assessment and Plan PT Assessment and Plan Clinical Impression Statement: 63 yr old female referred for B hip pain onset about 2-3 months ago with x rays revealing modertate hip arthritis. [name] has decreased AROM/PROM on her hips especially limited in left hip internal rotations and flexion as well as llimtations with B hip abduction. She has decreased gluteal and piriformis flexibility with tenderness of B hip greater trochnaters. She can beneift from skilled outpatient therapy to reach goals listed and prevent further loss of hip ROM and functional abiilities  Pt will benefit from skilled therapeutic intervention in order to improve on the following deficits: Decreased balance;Decreased mobility;Pain;Decreased range of motion;Impaired flexibility;Decreased strength Rehab Potential: Good Clinical Impairments Affecting Rehab Potential: moderate hip OA PT Frequency: Min 2X/week PT Duration: 6 weeks PT Treatment/Interventions: Gait training;Functional mobility training;Therapeutic activities;Therapeutic exercise;Manual techniques;Patient/family education;Neuromuscular re-education;Modalities PT Plan: LE stretches, hip PROM/AROM. manual PROM hips, manual  and stretches , soft tissue work hip greater trochanteric area, nustep or biike     Goals Home Exercise Program Pt/caregiver will Perform Home Exercise Program: For increased ROM PT Goal: Perform Home Exercise Program - Progress: Goal set today PT Short Term Goals Time to Complete Short Term Goals: 3 weeks PT Short Term Goal 1: Improve B hip abduction AROM to 35 degrees  PT Short Term Goal 2: Improve B hip flexion to 115 degrees for flexibility duiring dressing and bathing  PT Short Term Goal 3: patient walking 20 min wth max pain 2/10  PT Long Term Goals Time to Complete Long Term Goals:  (6 weeks ) PT Long Term  Goal 1: patient ambulation  25 min or more without increased pain for community mobility   PT Long Term Goal 2: Patient B hip flexion MMT 4/5 for tranfsers and bed mobility ease  Long Term Goal 3: Patient indpendent with HEP for B hip flexibility and strengthening to manage hip OA at discharge  Long Term Goal 4: pateint to report mild to no difficulty with basic household work   Problem List Patient Active Problem List   Diagnosis Date Noted  . Bilateral hip pain 10/14/2013  . Status post right knee replacement 10/10/2013  . Status post left knee replacement 10/10/2013  . Degenerative arthritis of knee, bilateral 10/10/2013  . Degenerative arthritis of hip 10/10/2013  . S/P carpal tunnel release 05/22/2012  . CTS (carpal tunnel syndrome) 02/29/2012  . S/P total knee replacement 08/10/2011  . TOTAL KNEE FOLLOW-UP 08/04/2010  . IMPINGEMENT SYNDROME 10/11/2007  . KNEE PAIN 05/31/2007  . OSTEOARTHRITIS, LOWER LEG 05/03/2007    PT - End of Session Activity Tolerance: Patient tolerated treatment well;Patient limited by pain PT Plan of Care PT Home Exercise Plan: writtene HEP provded, will progress and review prn  Consulted and Agree with Plan of Care: Patient  GP Functional Assessment Tool Used: FOTO 46 percent / 54 % limitations  mobility  Functional Limitation: Mobility: Walking and moving around Mobility: Walking and Moving Around Current Status (W0981): At least 40 percent but less than 60  percent impaired, limited or restricted Mobility: Walking and Moving Around Goal Status [id number]): At least 20 percent but less than 40 percent impaired, limited or restricted  Kaydo,Michelle 10/14/2013, 11:50 AM  Physician Documentation Your signature is required to indicate approval of the treatment plan as stated above.  Please sign and either send electronically or make a copy of this report for your files and return this physician signed original.   Please mark one 1.__approve of plan  2.  ___approve of plan with the following conditions.   ______________________________                                                          _____________________ Physician Signature                                                                                                             Date

## 2013-10-14 NOTE — Evaluation (Signed)
Physical Therapy Evaluation  Patient Details  Name: [name] MRN: [id number] Date of Birth: [birth year]  Today's Date: 10/14/2013 Time: 0915-1015 PT Time Calculation (min): 60 min PT charges : 1 evaluation                Visit#: 1 of 12  Re-eval: 11/13/13 Assessment Diagnosis: b hip arthritis  Next MD Visit: Dr Romeo Apple  Prior Therapy: no   Authorization: Medicare     Authorization Time Period:    Authorization Visit#: 1 of 12   Past Medical History:  Past Medical History  Diagnosis Date  . High cholesterol   . Arthritis   . Anxiety   . Carpal tunnel syndrome on both sides    Past Surgical History:  Past Surgical History  Procedure Laterality Date  . Knee surgery    . Laminectomy    . Appendectomy    . Joint replacement  2008    bilateral knees, [facility]; [place]  . Bunionectomy  09/29/2011    Procedure: [name];  Surgeon: Dallas Schimke, DPM;  Location: [facility];  Service: Orthopedics;  Laterality: Right;  [place] Bunionectomy Right Foot  . Carpal tunnel release  04/13/2012    Procedure: CARPAL TUNNEL RELEASE;  Surgeon: Vickki Hearing, MD;  Location: [facility];  Service: Orthopedics;  Laterality: Right;    Subjective Symptoms/Limitations Symptoms: c/o B hip and groin  pain which intially started only on R side, lateral B  hip pain also present ,  Noticed pain worsened with some treadmil walking for fitness in which patient walking about 1 mile , now pain with prolonged walking and sitting, pain worsening. has stopped walking program  Pertinent History: B knee replacements 2008 , onset of B   hip/thigh pain about 2-3 months ago around 07/26/2013 , hx of back surgery  How long can you sit comfortably?: 20 -40 min  How long can you walk comfortably?: 15 min  Special Tests: hip x rays moderate arthritis  Patient Stated Goals: decrease pain, improve walking tolerance  Pain Assessment Currently in Pain?: Yes Pain Score: 5/10 average   Pain Location: Hip Pain  Orientation: Right;Left Pain Type: Chronic pain Pain Onset: More than a month ago Pain Frequency: Intermittent Pain Relieving Factors: ibprofen at night if needed  Effect of Pain on Daily Activities: less walking due to pain,  fear of falls recently     Balance Screening Balance Screen Has the patient fallen in the past 6 months: Yes How many times?: 2 , recalls one time using stairs with snow coverage landing on right side on body  Has the patient had a decrease in activity level because of a fear of falling? : Yes Is the patient reluctant to leave their home because of a fear of falling? : No  Prior Functioning  Prior Function Level of Independence: Independent with basic ADLs;Independent with gait  Able to Take Stairs?: Yes Driving: Yes Vocation: On disability Leisure: Hobbies-yes (Comment) Comments: walking   Cognition/Observation Cognition Overall Cognitive Status: Within Functional Limits for tasks assessed Observation/Other Assessments Observations: no walking device  Other Assessments: gait: narrow base of support, gait speed WNL   Sensation/Coordination/Flexibility/Functional Tests Flexibility Obers: Positive bilateral  Functional Tests Functional Tests: FOTO 46  Functional Tests: sit to stand no UE   Assessment RLE AROM (degrees) Right Hip Flexion: 110 Right Hip ABduction: 20 RLE PROM (degrees) Right Hip Flexion: 115 Right Hip ABduction: 30 Right hip Internal Rotation 50  Right hip external rotation 40  RLE  Strength Right Hip Flexion: 3/5 Right Hip ABduction: 4/5 Right Knee Flexion: 4/5 Right Knee Extension: 4/5 LLE AROM (degrees) Left Hip Flexion: 95 Left Hip ABduction: 30 LLE PROM (degrees) Left Hip Flexion: 115 Left Hip ABduction: 45 Left hip internal rotation 0  LEft hip external rotation 45  LLE Strength Left Hip Flexion: 3+/5 Left Hip ABduction: 4/5 Left Knee Flexion: 4/5 Left Knee Extension: 4/5 Palpation Palpation: B tenderness hip  greater trochanters  Fabers test positive  B for pain and decreased ROM  Exercise/Treatments Mobility/Balance  Static Standing Balance Single Leg Stance - Right Leg: 7 sec  Single Leg Stance - Left Leg: 7 sec  Tandem Stance - Right Leg: 10 sec  Rhomberg - Eyes Opened: 60  Seconds   Stretches Single Knee to Chest Stretch: 30 seconds;2 reps Piriformis Stretch: 2 reps;30 seconds  Manual Therapy Manual Therapy: Joint mobilization Joint Mobilization: B hip PROM gentlle all planes   Physical Therapy Assessment and Plan PT Assessment and Plan Clinical Impression Statement: 63 yr old female referred for B hip pain onset about 2-3 months ago with x rays revealing modertate hip arthritis. [name] has decreased AROM/PROM on her hips especially limited in left hip internal rotations and flexion as well as llimtations with B hip abduction. She has decreased gluteal and piriformis flexibility with tenderness of B hip greater trochanters She can beneift from skilled outpatient therapy to reach goals listed and prevent further loss of hip ROM and functional abiilities  Pt will benefit from skilled therapeutic intervention in order to improve on the following deficits: Decreased balance;Decreased mobility;Pain;Decreased range of motion;Impaired flexibility;Decreased strength Rehab Potential: Good Clinical Impairments Affecting Rehab Potential: moderate hip OA PT Frequency: Min 2X/week PT Duration: 6 weeks PT Treatment/Interventions: Gait training;Functional mobility training;Therapeutic activities;Therapeutic exercise;Manual techniques;Patient/family education;Neuromuscular re-education;Modalities PT Plan: LE stretches, hip PROM/AROM. manual PROM hips, manual  and stretches , soft tissue work hip greater trochanteric area, nustep or biike     Goals Home Exercise Program Pt/caregiver will Perform Home Exercise Program: For increased ROM PT Goal: Perform Home Exercise Program - Progress: Goal set today PT  Short Term Goals Time to Complete Short Term Goals: 3 weeks PT Short Term Goal 1: Improve B hip abduction AROM to 35 degrees  PT Short Term Goal 2: Improve B hip flexion to 115 degrees for flexibility duiring dressing and bathing  PT Short Term Goal 3: patient walking 20 min wth max pain 2/10  PT Long Term Goals Time to Complete Long Term Goals:  (6 weeks ) PT Long Term Goal 1: patient ambulation  25 min or more without increased pain for community mobility   PT Long Term Goal 2: Patient B hip flexion MMT 4/5 for tranfsers and bed mobility ease  Long Term Goal 3: Patient indpendent with HEP for B hip flexibility and strengthening to manage hip OA at discharge  Long Term Goal 4: pateint to report mild to no difficulty with basic household work   Problem List Patient Active Problem List   Diagnosis Date Noted  . Bilateral hip pain 10/14/2013  . Status post right knee replacement 10/10/2013  . Status post left knee replacement 10/10/2013  . Degenerative arthritis of knee, bilateral 10/10/2013  . Degenerative arthritis of hip 10/10/2013  . S/P carpal tunnel release 05/22/2012  . CTS (carpal tunnel syndrome) 02/29/2012  . S/P total knee replacement 08/10/2011  . TOTAL KNEE FOLLOW-UP 08/04/2010  . IMPINGEMENT SYNDROME 10/11/2007  . KNEE PAIN 05/31/2007  . OSTEOARTHRITIS, LOWER LEG 05/03/2007  PT - End of Session Activity Tolerance: Patient tolerated treatment well;Patient limited by pain PT Plan of Care PT Home Exercise Plan: writtene HEP provded, will progress and review prn  Consulted and Agree with Plan of Care: Patient  GP Functional Assessment Tool Used: FOTO 46 percent / 54 % limitations  mobility  Functional Limitation: Mobility: Walking and moving around Mobility: Walking and Moving Around Current Status ([id number]): At least 40 percent but less than 60 percent impaired, limited or restricted Mobility: Walking and Moving Around Goal Status [phone]): At least 20 percent but less  than 40 percent impaired, limited or restricted  Syrai Gladwin 10/14/2013, 11:45 AM  Physician Documentation Your signature is required to indicate approval of the treatment plan as stated above.  Please sign and either send electronically or make a copy of this report for your files and return this physician signed original.   Please mark one 1.__approve of plan  2. ___approve of plan with the following conditions.   ______________________________                                                          _____________________ Physician Signature                                                                                                             Date

## 2013-10-16 ENCOUNTER — Ambulatory Visit (HOSPITAL_COMMUNITY)
Admission: RE | Admit: 2013-10-16 | Discharge: 2013-10-16 | Disposition: A | Discharge status: Home / Self Care | Payer: Medicare Other | Source: Ambulatory Visit | Attending: Orthopedic Surgery | Admitting: Orthopedic Surgery

## 2013-10-16 NOTE — Progress Notes (Signed)
Physical Therapy Treatment Patient Details  Name: Audrey FinchBarbara K Marquez MRN: [id number] Date of Birth: Oct 01, 1950  Today's Date: 10/16/2013 Time: 0935-1020 PT Time Calculation (min): 45 min Visit#: 2 of 12  Re-eval: 11/13/13 Authorization: Medicare   Authorization Visit#: 2 of 12  Charges:  TE:  [phone] (30'), manual [id number] (12')  Subjective: Symptoms/Limitations Symptoms: Pt states her Lt  hip hurts worse than her Rt.  10/10 today on Lt; 5/10 Rt.  States it sometimes "catches" when she initially stands. Pain Assessment Currently in Pain?: Yes Pain Score: 7  (average; Rt 5/10, Lt 10/10) Pain Location: Hip Pain Orientation: Left;Right   Exercise/Treatments Stretches Knee: Self-Stretch to increase Flexion: Limitations Knee: Self-Stretch Limitations: KTC 30" each Aerobic Stationary Bike: NuStep 8' level 2 hills 2 SPM>50 Standing Heel Raises: 10 reps;Limitations Heel Raises Limitations: toeraises 10 reps Seated Other Seated Knee Exercises: heel rolls/toe rolls 5X5" each Other Seated Knee Exercises: Hip flexion 10 reps each Supine Bridges: 10 reps Straight Leg Raises: 10 reps Sidelying Hip ABduction: 10 reps Prone  Hip Extension: 10 reps   Manual Therapy Manual Therapy: Other (comment) Joint Mobilization: PROM for hip ER/IR bilaterally and hip flexion in prone position; MFR Lt greater trochanter  Physical Therapy Assessment and Plan PT Assessment and Plan Clinical Impression Statement: Added new exercises per flow sheet; PROM to bilateral hips in prone position.  Pain of greatest concern in Lt hip.  Reports she has pinpoint pain over bilateral greater trochanters.  Pt may benefit from iontophoresis treatment with dexamethosone to decrease tenderness and inflammation.  To discuss with evaluating therapist. PT Plan: Continue with LE stretches, hip PROM/AROM. and exercises.  discuss trial of ionto with dex with evaluating therapist.       Problem List Patient Active  Problem List   Diagnosis Date Noted  . Bilateral hip pain 10/14/2013  . Status post right knee replacement 10/10/2013  . Status post left knee replacement 10/10/2013  . Degenerative arthritis of knee, bilateral 10/10/2013  . Degenerative arthritis of hip 10/10/2013  . S/P carpal tunnel release 05/22/2012  . CTS (carpal tunnel syndrome) 02/29/2012  . S/P total knee replacement 08/10/2011  . TOTAL KNEE FOLLOW-UP 08/04/2010  . IMPINGEMENT SYNDROME 10/11/2007  . KNEE PAIN 05/31/2007  . OSTEOARTHRITIS, LOWER LEG 05/03/2007    PT - End of Session Activity Tolerance: Patient tolerated treatment well General Behavior During Therapy: WFL for tasks assessed/performed   Audrey NidaAmy B Rochanda Marquez, PTA/CLT 10/16/2013, 10:59 AM

## 2013-10-21 ENCOUNTER — Ambulatory Visit (HOSPITAL_COMMUNITY)
Admission: RE | Admit: 2013-10-21 | Discharge: 2013-10-21 | Disposition: A | Discharge status: Home / Self Care | Payer: Medicare Other | Source: Ambulatory Visit | Attending: Orthopedic Surgery | Admitting: Orthopedic Surgery

## 2013-10-21 NOTE — Progress Notes (Signed)
Physical Therapy Treatment Patient Details  Name: Audrey FinchBarbara K Marquez MRN: [id number] Date of Birth: 25-May-1951  Today's Date: 10/21/2013 Time: 0930-1030 PT Time Calculation (min): 60 min  Visit#: 3 of 12  Re-eval: 11/13/13 Authorization: Medicare   Authorization Visit#: 3 of 12  Charges:  therex [phone] (25'), ionto bilateral hips 1000-1025 (25')  Subjective:  Pt states her hips felt great after last visit so she decided to walk on her treadmill . Pt states her pain increased after ambulating.  Pt instructed to hold treadmill for now/minimal walking to not flare up hip pain.  Pt currently with 5/10 pain bilateral hips.     Exercise/Treatments Aerobic Stationary Bike: NuStep 8' level 2 hills 2 SPM>50 Standing Heel Raises: 10 reps;Limitations Heel Raises Limitations: toeraises 10 reps Wall Squat: 5 seconds;10 reps Other Standing Knee Exercises: hip abduction & extension 10 reps each Seated Other Seated Knee Exercises: Hip flexion 10 reps each Supine Bridges: 10 reps Straight Leg Raises: 10 reps   Modalities Modalities: Iontophoresis Iontophoresis Type of Iontophoresis: Dexamethasone Location: Bilateral greater trochanters Dose: 2cc dexamethasone  3.490ma at 5660ma/min Time: 20 minutes  Physical Therapy Assessment and Plan PT Assessment:  Progressed hip exercises to standing position without difficulty.  Required therapist facilitation to perform exercises in correct form.  Signed order received to begin iontophoresis with dexamethasone to bilateral hips.  Explained mechanism of ionto and instructed to remove patches 2-3 hours after leaving clinic.  Pt verbalized understanding.   Plan: Continue with LE stretches, hip PROM/AROM and exercises.  Inspect skin from ionto treatment and assess pain next visit.       Problem List Patient Active Problem List   Diagnosis Date Noted  . Bilateral hip pain 10/14/2013  . Status post right knee replacement 10/10/2013  . Status post left  knee replacement 10/10/2013  . Degenerative arthritis of knee, bilateral 10/10/2013  . Degenerative arthritis of hip 10/10/2013  . S/P carpal tunnel release 05/22/2012  . CTS (carpal tunnel syndrome) 02/29/2012  . S/P total knee replacement 08/10/2011  . TOTAL KNEE FOLLOW-UP 08/04/2010  . IMPINGEMENT SYNDROME 10/11/2007  . KNEE PAIN 05/31/2007  . OSTEOARTHRITIS, LOWER LEG 05/03/2007    PT - End of Session Activity Tolerance: Patient tolerated treatment well General Behavior During Therapy: WFL for tasks assessed/performed   Lurena NidaAmy B Frazier, PTA/CLT 10/21/2013, 10:17 AM

## 2013-10-23 ENCOUNTER — Ambulatory Visit (HOSPITAL_COMMUNITY)
Admission: RE | Admit: 2013-10-23 | Discharge: 2013-10-23 | Disposition: A | Discharge status: Home / Self Care | Payer: Medicare Other | Source: Ambulatory Visit | Attending: Family Medicine | Admitting: Family Medicine

## 2013-10-23 DIAGNOSIS — M25551 Pain in right hip: Secondary | ICD-10-CM

## 2013-10-23 DIAGNOSIS — IMO0001 Reserved for inherently not codable concepts without codable children: Secondary | ICD-10-CM | POA: Insufficient documentation

## 2013-10-23 DIAGNOSIS — M25659 Stiffness of unspecified hip, not elsewhere classified: Secondary | ICD-10-CM | POA: Insufficient documentation

## 2013-10-23 DIAGNOSIS — M25559 Pain in unspecified hip: Secondary | ICD-10-CM | POA: Insufficient documentation

## 2013-10-23 DIAGNOSIS — M25552 Pain in left hip: Secondary | ICD-10-CM

## 2013-10-23 NOTE — Progress Notes (Signed)
Physical Therapy Treatment Patient Details  Name: Audrey FinchBarbara K Marquez MRN: [id number] Date of Birth: Jul 17, 1951  Today's Date: 10/23/2013 Time: 0930-1035 PT Time Calculation (min): 65 min 0930 -0950 TE  0950 -1015 Manual  1015 - 1035 ionto   Visit#: 4 of 12  Re-eval: 11/13/13 Assessment Diagnosis: b hip arthritis  Next MD Visit: Dr Romeo AppleHarrison  Prior Therapy: no   Authorization: Medicare   Authorization Time Period:    Authorization Visit#: 4 of 12   Subjective: Symptoms/Limitations Symptoms: doing better, no trouble from ionto feels it was helpful, B hip pain <2/10  Pertinent History: B knee replacements 2008 , onset of B   hip/thigh pain about 2-3 months ago around 07/26/2013   Precautions/Restrictions     Exercise/Treatments Mobility/Balance        Stretches Hip Flexor Stretch: 3 reps;10 seconds;Limitations Hip Flexor Stretch Limitations: standing using 6 in step B  Knee: Self-Stretch Limitations: KTC 30" each Gastroc Stretch: 2 reps;30 seconds Aerobic Stationary Bike: NuStep 8' level 2 hills 2 SPM>50 Machines for Strengthening   Plyometrics   Standing   Seated   Supine Bridges: 10 reps Straight Leg Raises: 10 reps Sidelying   Prone      Modalities Modalities: Electrical Stimulation Manual Therapyx 25 min for hip flexibility, ROM, MFR  Joint Mobilization: PROM supine and prone B hips internal and external roations, B hip flexion, MFR B greater trochnater, prone quad stretch, gentle hip adduction manual stretches: manual x 25 min , piriformis stretch  Iontophoresis Type of Iontophoresis: Dexamethasone Location: bilateral greater trochanters  Dose: 2cc dexamethasone  3.570ma at 860ma/min Time: 20 minutes  Physical Therapy Assessment and Plan PT Assessment and Plan Clinical Impression Statement: good response to treatment thius far with reduced pain post 2 visits, no adverse effects to ionto  PT Plan: continue per plan , continue back into leg exercises  supine and standing established prior visit     Goals PT Short Term Goals Time to Complete Short Term Goals: 3 weeks PT Short Term Goal 1: Improve B hip abduction AROM to 35 degrees  PT Short Term Goal 1 - Progress: Progressing toward goal PT Short Term Goal 2: Improve B hip flexion to 115 degrees for flexibility duiring dressing and bathing  PT Short Term Goal 2 - Progress: Progressing toward goal PT Short Term Goal 3: patient walking 20 min wth max pain 2/10  PT Long Term Goals PT Long Term Goal 1: patient ambulation  25 min or more without increased pain for community mobility  within 6 weeks  PT Long Term Goal 2: Patient B hip flexion MMT 4/5 for tranfsers and bed mobility ease  PT Long Term Goal 2 - Progress: Progressing toward goal Long Term Goal 3: Patient indpendent with HEP for B hip flexibility and strengthening to manage hip OA at discharge  Long Term Goal 4: pateint to report mild to no difficulty with basic household work  Long Term Goal 4 Progress: Progressing toward goal  Problem List Patient Active Problem List   Diagnosis Date Noted  . Bilateral hip pain 10/14/2013  . Status post right knee replacement 10/10/2013  . Status post left knee replacement 10/10/2013  . Degenerative arthritis of knee, bilateral 10/10/2013  . Degenerative arthritis of hip 10/10/2013  . S/P carpal tunnel release 05/22/2012  . CTS (carpal tunnel syndrome) 02/29/2012  . S/P total knee replacement 08/10/2011  . TOTAL KNEE FOLLOW-UP 08/04/2010  . IMPINGEMENT SYNDROME 10/11/2007  . KNEE PAIN 05/31/2007  . OSTEOARTHRITIS, LOWER  LEG 05/03/2007    PT - End of Session Activity Tolerance: Patient tolerated treatment well  GP    Jaymason Ledesma 10/23/2013, 11:49 AM

## 2013-10-28 ENCOUNTER — Ambulatory Visit (HOSPITAL_COMMUNITY)
Admission: RE | Admit: 2013-10-28 | Discharge: 2013-10-28 | Disposition: A | Discharge status: Home / Self Care | Payer: Medicare Other | Source: Ambulatory Visit | Attending: Orthopedic Surgery | Admitting: Orthopedic Surgery

## 2013-10-28 NOTE — Progress Notes (Signed)
Physical Therapy Treatment Patient Details  Name: [name] MRN: [id number] Date of Birth: 1951-05-02  Today's Date: 10/28/2013 Time: 0930-1035 PT Time Calculation (min): 65 min 0930 - 1000 TE  1000 -1015 manual  1020 - 1045 ionto   Visit#: 5 of 12  Re-eval: 11/13/13 Assessment Diagnosis: b hip arthritis  Next MD Visit: Dr Romeo Apple  Prior Therapy: no   Authorization: Medicare   Authorization Time Period:    Authorization Visit#: 5 of 10   Subjective: Symptoms/Limitations Symptoms: pain in left hip not as intense, hips feel like they are catching when bending over, overall improved  Pertinent History: B knee replacements 2008 , onset of B   hip/thigh pain about 2-3 months ago around 07/26/2013  Pain Assessment Currently in Pain?: Yes Pain Score: 2  Pain Location: Hip Pain Orientation: Right;Left  Precautions/Restrictions     Exercise/Treatments  Stretches Single Knee to Chest Stretch: 30 seconds;2 reps Piriformis Stretch: 2 reps;30 seconds;Limitations Piriformis Stretch Limitations: manual  Supine Bent Knee Raise: 10 reps Other Supine Lumbar Exercises: core up up down down exercise 7 reps    Stretches Hip Flexor Stretch: 3 reps;10 seconds;Limitations Hip Flexor Stretch Limitations: standing using 6 in step B  Knee: Self-Stretch Limitations: KTC 30" each Gastroc Stretch: 2 reps;30 seconds Aerobic Stationary Bike: NuStep 8' level 2 hills 2 SPM>50   Standing Heel Raises: 10 reps;Limitations Heel Raises Limitations: toeraises 10 reps Lateral Step Up: Both;15 reps;Step Height: 4";Hand Hold: 1 Forward Step Up: Step Height: 4";Left;10 reps Other Standing Knee Exercises: hip abduction & extension 10 reps each Supine Bridges: 10 reps Straight Leg Raises: 10 reps Sidelying   Prone  Hip Extension: 10 reps   Manual Therapy Manual Therapy: Other (comment) Joint Mobilization: PROM supine left hip internal/exteranl rotations, B hip flexion, MFR left greater  trochanter, left piriformis stretch  Iontophoresis Type of Iontophoresis: Dexamethasone Location: bilateral greater trochanters  Dose: 2cc dexamethasone  3.22ma at 65ma/min Time: 20 minutes  Physical Therapy Assessment and Plan PT Assessment and Plan Clinical Impression Statement: decreased tenderness with palpation at left hip grater trochnater, left hip external rotations and abduction AROM improving, left piriformis notable decrease flexibility, gait in clinic pain free but with decreased hip flexion B during swing phase and decreased hip extesnion. good response thus far with ionto  treatment  PT Plan: continue per plan ,  LE stretches, core  strengthening , hip strengthening , continue ionto     Goals PT Short Term Goals Time to Complete Short Term Goals: 3 weeks PT Short Term Goal 1: Improve B hip abduction AROM to 35 degrees  PT Short Term Goal 1 - Progress: Progressing toward goal PT Short Term Goal 2: Improve B hip flexion to 115 degrees for flexibility duiring dressing and bathing  PT Short Term Goal 2 - Progress: Progressing toward goal PT Short Term Goal 3: patient walking 20 min wth max pain 2/10  PT Long Term Goals PT Long Term Goal 1: patient ambulation  25 min or more without increased pain for community mobility  within 6 weeks  PT Long Term Goal 1 - Progress: Progressing toward goal PT Long Term Goal 2: Patient B hip flexion MMT 4/5 for tranfsers and bed mobility ease  Long Term Goal 3: Patient indpendent with HEP for B hip flexibility and strengthening to manage hip OA at discharge  Long Term Goal 3 Progress: Progressing toward goal Long Term Goal 4: pateint to report mild to no difficulty with basic household work  Long Term Goal 4 Progress: Progressing toward goal  Problem List Patient Active Problem List   Diagnosis Date Noted  . Bilateral hip pain 10/14/2013  . Status post right knee replacement 10/10/2013  . Status post left knee replacement 10/10/2013  .  Degenerative arthritis of knee, bilateral 10/10/2013  . Degenerative arthritis of hip 10/10/2013  . S/P carpal tunnel release 05/22/2012  . CTS (carpal tunnel syndrome) 02/29/2012  . S/P total knee replacement 08/10/2011  . TOTAL KNEE FOLLOW-UP 08/04/2010  . IMPINGEMENT SYNDROME 10/11/2007  . KNEE PAIN 05/31/2007  . OSTEOARTHRITIS, LOWER LEG 05/03/2007       GP    Sherrey North 10/28/2013, 12:15 PM

## 2013-10-30 ENCOUNTER — Ambulatory Visit (HOSPITAL_COMMUNITY)
Admission: RE | Admit: 2013-10-30 | Discharge: 2013-10-30 | Disposition: A | Discharge status: Home / Self Care | Payer: Medicare Other | Source: Ambulatory Visit | Attending: Orthopedic Surgery | Admitting: Orthopedic Surgery

## 2013-10-30 DIAGNOSIS — M25552 Pain in left hip: Principal | ICD-10-CM

## 2013-10-30 DIAGNOSIS — M25551 Pain in right hip: Secondary | ICD-10-CM

## 2013-10-30 NOTE — Progress Notes (Signed)
Physical Therapy Treatment Patient Details  Name: [name] MRN: [id number] Date of Birth: 11-23-1950  Today's Date: 10/30/2013 Time: 0932-1040 PT Time Calculation (min): 22 min Charge : Manual [id number], TE 0950-1018, Ionto 1020-1040   Visit#: 6 of 12  Re-eval: 11/13/13 Assessment Diagnosis: b hip arthritis  Next MD Visit: Dr Aline Brochure unscheduled Prior Therapy: no   Authorization: Medicare   Authorization Time Period:    Authorization Visit#: 6 of 10   Subjective: Symptoms/Limitations Symptoms: Pt reported pain free today, feels like hips are slipping.  Positive results with ionto.   Pain Assessment Currently in Pain?: No/denies  Objective:   Exercise/Treatments Stretches Piriformis Stretch: 3 reps;30 seconds;Limitations Piriformis Stretch Limitations: 4way with towel Standing Forward Lunges: Both;5 reps;3 seconds;Limitations Forward Lunges Limitations: lunge with rotation Other Standing Knee Exercises: high march and hold 10x alternating 5" holds no HHA Seated Other Seated Knee Exercises: anterior/posterior rotation Other Seated Knee Exercises:   Supine Bridges: 10 reps Straight Leg Raises: 10 reps Other Supine Knee Exercises: PFC 10x 10" Sidelying Hip ABduction: 10 reps Prone  Hip Extension: 10 reps   Manual Therapy Manual Therapy: Other (comment) Other Manual Therapy: Muscle energy technique Bil LE inflare Iontophoresis Type of Iontophoresis: Dexamethasone Location: bilateral greater trochanters  Dose: 2cc dexamethasone  3.47m at 632mmin Time: 20 minutes   Physical Therapy Assessment and Plan PT Assessment and Plan Clinical Impression Statement: Began session with muscle energy technique (MET) for Bil sacroiliac inflare, pt with improved external rotation pain free.  Improved gait mechanics with improved rotation with swing phase.no HHA.  Added therex for hip strengthenig and to improve rotationt and AROM with Bil knees.  Ended session wth  iontophoresis #4. PT Plan: continue per plan ,  LE stretches, core  strengthening , hip strengthening , continue ionto     Goals PT Short Term Goals Time to Complete Short Term Goals: 3 weeks PT Short Term Goal 1: Improve B hip abduction AROM to 35 degrees  PT Short Term Goal 1 - Progress: Progressing toward goal PT Short Term Goal 2: Improve B hip flexion to 115 degrees for flexibility duiring dressing and bathing  PT Short Term Goal 2 - Progress: Progressing toward goal PT Short Term Goal 3: patient walking 20 min wth max pain 2/10  PT Long Term Goals PT Long Term Goal 1: patient ambulation  25 min or more without increased pain for community mobility  within 6 weeks  PT Long Term Goal 2: Patient B hip flexion MMT 4/5 for tranfsers and bed mobility ease  PT Long Term Goal 2 - Progress: Progressing toward goal Long Term Goal 3: Patient indpendent with HEP for B hip flexibility and strengthening to manage hip OA at discharge  Long Term Goal 3 Progress: Progressing toward goal Long Term Goal 4: pateint to report mild to no difficulty with basic household work   Problem List Patient Active Problem List   Diagnosis Date Noted  . Bilateral hip pain 10/14/2013  . Status post right knee replacement 10/10/2013  . Status post left knee replacement 10/10/2013  . Degenerative arthritis of knee, bilateral 10/10/2013  . Degenerative arthritis of hip 10/10/2013  . S/P carpal tunnel release 05/22/2012  . CTS (carpal tunnel syndrome) 02/29/2012  . S/P total knee replacement 08/10/2011  . TOTAL KNEE FOLLOW-UP 08/04/2010  . IMPINGEMENT SYNDROME 10/11/2007  . KNEE PAIN 05/31/2007  . OSTEOARTHRITIS, LOWER LEG 05/03/2007    PT - End of Session Activity Tolerance: Patient tolerated treatment well General Behavior  During Therapy: [facility] for tasks assessed/performed  GP    Aldona Lento 10/30/2013, 10:36 AM

## 2013-11-04 ENCOUNTER — Ambulatory Visit (HOSPITAL_COMMUNITY)
Admission: RE | Admit: 2013-11-04 | Discharge: 2013-11-04 | Disposition: A | Discharge status: Home / Self Care | Payer: Medicare Other | Source: Ambulatory Visit | Attending: Orthopedic Surgery | Admitting: Orthopedic Surgery

## 2013-11-04 NOTE — Progress Notes (Addendum)
Physical Therapy Treatment Patient Details  Name: Audrey FinchBarbara K Marquez MRN: [id number] Date of Birth: 01-Jun-1951  Today's Date: 11/04/2013 Time: 2952-84130933-1040 PT Time Calculation (min): 67 min Charges: Therex x 24'([id number]'(0933-1010) Ionto x 20'(0920-0940)  Visit#: 7 of 12  Re-eval: 11/13/13  Authorization: Medicare   Authorization Visit#: 7 of 10   Subjective: Symptoms/Limitations Symptoms: Pt reports significant decrease in pain since beginning therapy. Pt's chief complaint is hip instability. Pain Assessment Currently in Pain?: No/denies   Exercise/Treatments Stretches Piriformis Stretch: 3 reps;30 seconds;Limitations Standing Forward Lunges: Both;5 reps;3 seconds;Limitations Forward Lunges Limitations: lunge with rotation Other Standing Knee Exercises: high march and hold 10x alternating 5" holds no HHA Supine Bridges: 10 reps Straight Leg Raises: 10 reps;Both Sidelying Hip ABduction: Both;10 reps Clams: 5x5" B Prone  Hip Extension: 10 reps   Iontophoresis Type of Iontophoresis: Dexamethasone Location: bilateral greater trochanters  Dose: 2cc dexamethasone  3.760ma at 7960ma/min Time: 20 minutes   Physical Therapy Assessment and Plan PT Assessment and Plan Clinical Impression Statement: Pt completes therex well after initial cueing and demo. Pt requires multimodal cueing to avoid dropping hip to compensate for quad weakness with lateral step-ups. Began sidelying clams to improve gluteus medius strength. Continued with iontophoresis with dexamethasone to bilateral hips secondary to decrease pain with previous tx. Pt reports 0/10 pain at end of session. PT Plan: Continue to progress hip flexibility and stability. Continue with ionto to decrease pain.    Problem List Patient Active Problem List   Diagnosis Date Noted  . Bilateral hip pain 10/14/2013  . Status post right knee replacement 10/10/2013  . Status post left knee replacement 10/10/2013  . Degenerative arthritis of knee,  bilateral 10/10/2013  . Degenerative arthritis of hip 10/10/2013  . S/P carpal tunnel release 05/22/2012  . CTS (carpal tunnel syndrome) 02/29/2012  . S/P total knee replacement 08/10/2011  . TOTAL KNEE FOLLOW-UP 08/04/2010  . IMPINGEMENT SYNDROME 10/11/2007  . KNEE PAIN 05/31/2007  . OSTEOARTHRITIS, LOWER LEG 05/03/2007    PT - End of Session Activity Tolerance: Patient tolerated treatment well General Behavior During Therapy: [facility] tasks assessed/performed   Seth Bakeebekah Margerite Impastato, PTA 11/04/2013, 11:04 AM

## 2013-11-06 ENCOUNTER — Ambulatory Visit (HOSPITAL_COMMUNITY)
Admission: RE | Admit: 2013-11-06 | Discharge: 2013-11-06 | Disposition: A | Discharge status: Home / Self Care | Payer: Medicare Other | Source: Ambulatory Visit | Attending: Orthopedic Surgery | Admitting: Orthopedic Surgery

## 2013-11-06 NOTE — Progress Notes (Signed)
Physical Therapy Treatment Patient Details  Name: Audrey FinchBarbara K Heap MRN: [id number] Date of Birth: [birth year]  Today's Date: 11/06/2013 Time: [id number] PT Time Calculation (min): 52 min Charges: Therex x 25' Ionto x 20'  Visit#: 8 of 12  Re-eval: 11/13/13  Authorization: Medicare   Authorization Visit#: 8 of 10   Subjective: Symptoms/Limitations Symptoms: I'm not having any pain. I just fill like my hips are slipping. Pain Assessment Currently in Pain?: No/denies  Exercise/Treatments Aerobic Stationary Bike: NuStep 8' level 4 hills 3 Standing Forward Lunges: Both;5 reps;3 seconds;Limitations Forward Lunges Limitations: lunge with rotation Other Standing Knee Exercises: high march and hold 10x alternating 5" holds no HHA Supine Bridges: 10 reps Straight Leg Raises: 10 reps;Both Other Supine Knee Exercises: Hip ext x 10 B Other Supine Knee Exercises: Hip abduction with blue tband x 10 B  Iontophoresis  Type of Iontophoresis: Dexamethasone  Location: bilateral greater trochanters  Dose: 2cc dexamethasone 3.370ma at 560ma/min  Time: 20 minutes   Physical Therapy Assessment and Plan PT Assessment and Plan Clinical Impression Statement: Pt completed therex well after initial cueing and demo. Pt requires multimodal cueing to improve form with functional squats. Completed 6th and final treatment of iontophoresis. PT appears to be progressing well toward all goals. PT Plan: Continue to progress hip flexibility and stability. D/C iontophoresis.    Problem List Patient Active Problem List   Diagnosis Date Noted  . Bilateral hip pain 10/14/2013  . Status post right knee replacement 10/10/2013  . Status post left knee replacement 10/10/2013  . Degenerative arthritis of knee, bilateral 10/10/2013  . Degenerative arthritis of hip 10/10/2013  . S/P carpal tunnel release 05/22/2012  . CTS (carpal tunnel syndrome) 02/29/2012  . S/P total knee replacement 08/10/2011  . TOTAL KNEE  FOLLOW-UP 08/04/2010  . IMPINGEMENT SYNDROME 10/11/2007  . KNEE PAIN 05/31/2007  . OSTEOARTHRITIS, LOWER LEG 05/03/2007    PT - End of Session Activity Tolerance: Patient tolerated treatment well General Behavior During Therapy: [facility] tasks assessed/performed  Seth Bakeebekah Jade Burright, PTA  11/06/2013, 10:48 AM

## 2013-11-11 ENCOUNTER — Ambulatory Visit (HOSPITAL_COMMUNITY): Payer: Medicare Other | Admitting: Physical Therapy

## 2013-11-13 ENCOUNTER — Ambulatory Visit (HOSPITAL_COMMUNITY)
Admission: RE | Admit: 2013-11-13 | Discharge: 2013-11-13 | Disposition: A | Discharge status: Home / Self Care | Payer: Medicare Other | Source: Ambulatory Visit | Attending: Orthopedic Surgery | Admitting: Orthopedic Surgery

## 2013-11-13 NOTE — Progress Notes (Signed)
Physical Therapy Treatment Patient Details  Name: [name] MRN: [id number] Date of Birth: 1950/08/28  Today's Date: 11/13/2013 Time: 0933-1012 PT Time Calculation (min): 39 min  Visit#: 9 of 12  Re-eval: 11/14/13 Authorization: Medicare   Authorization Visit#: 9 of 10  Charges:  therex 38  Subjective: Symptoms/Limitations Symptoms: Pt states she continues to be painfree in her hips but has that "slipping" feeling at times. Pain Assessment Currently in Pain?: No/denies  Exercise/Treatments Aerobic Stationary Bike: NuStep 8' level 4 hills 3 Standing Heel Raises: 10 reps;Limitations Heel Raises Limitations: toeraises 10 reps no HHA  Forward Lunges: Both;10 reps SLS: max of 3:  Lt:14", Rt:18" Gait Training: tandem and retro gait 1RT each Other Standing Knee Exercises: high march and hold 1RT  alternating 5" holds no HHA Other Standing Knee Exercises: Bilateral hip abduction 10 reps each      Physical Therapy Assessment and Plan PT Assessment and Plan Clinical Impression Statement: Pt continues to be painfree; iontophoresis is now discharged as completed 6 visit.  Focus now on strength and stability of bilateral LE's/hips.  Began balance activites with overall good stability.  High march with holds most difficult to complete. PT Plan: Re-evaluate next visit for 4 week progress; udate g-codes.     Problem List Patient Active Problem List   Diagnosis Date Noted  . Bilateral hip pain 10/14/2013  . Status post right knee replacement 10/10/2013  . Status post left knee replacement 10/10/2013  . Degenerative arthritis of knee, bilateral 10/10/2013  . Degenerative arthritis of hip 10/10/2013  . S/P carpal tunnel release 05/22/2012  . CTS (carpal tunnel syndrome) 02/29/2012  . S/P total knee replacement 08/10/2011  . TOTAL KNEE FOLLOW-UP 08/04/2010  . IMPINGEMENT SYNDROME 10/11/2007  . KNEE PAIN 05/31/2007  . OSTEOARTHRITIS, LOWER LEG 05/03/2007    PT - End of  Session Activity Tolerance: Patient tolerated treatment well General Behavior During Therapy: [facility] tasks assessed/performed  GP    Lurena NidaAmy B Oluwatomiwa Kinyon, PTA/CLT 11/13/2013, 10:29 AM

## 2013-11-14 ENCOUNTER — Ambulatory Visit (HOSPITAL_COMMUNITY)
Admission: RE | Admit: 2013-11-14 | Discharge: 2013-11-14 | Disposition: A | Discharge status: Home / Self Care | Payer: Medicare Other | Source: Ambulatory Visit | Attending: Orthopedic Surgery | Admitting: Orthopedic Surgery

## 2013-11-14 NOTE — Progress Notes (Signed)
Physical Therapy Re-evaluation  Patient Details  Name: [name] MRN: [id number] Date of Birth: 30-Nov-1950  Today's Date: 11/14/2013 Time: 8616-8372 PT Time Calculation (min): 44 min              Visit#: 10 of 14  Re-eval: 11/28/13 Diagnosis: Bil hip arthritis  Next MD Visit: Dr Aline Brochure unscheduled Authorization: Medicare     Authorization Visit#: 10 of 20  Charges:  MMT/ROM testing [phone] (15'), self care [phone] (25')   Subjective Pt reports she no longer has hip pain but her Rt hip feels like it is "slipping" at times.  Pt wishes to continue to improve her balance.  Sensation/Coordination/Flexibility/Functional Tests Functional Tests Functional Tests: FOTO 47 (was 46)   Objective: RLE AROM (degrees) Right hip flexion:  115 degrees Right Hip ABduction: 20 (was 20)  RLE PROM (degrees) Right Hip ABduction: 34 (was 30)  RLE Strength Right Hip Flexion: 5/5 (was 3/5) Right Hip ABduction: 4/5 (was 4/5) Right Knee Flexion: 5/5 (was 4/5) Right Knee Extension: 4/5 (was 4/5)  LLE AROM (degrees) Left hip flexion:  115 degrees Left Hip ABduction: 30 (was 30)  LLE PROM (degrees) Left Hip ABduction: 40 (was 45)  LLE Strength Left Hip Flexion: 5/5 (was 3+/5) Left Hip ABduction: 5/5 (was 4/5) Left Knee Flexion: 5/5 (was 4/5) Left Knee Extension: 4/5 (was 4/5)  Exercise/Treatments Mobility/Balance  Static Standing Balance Single Leg Stance - Right Leg: 18 (was 7 seconds) Single Leg Stance - Left Leg: 14 (was 7 seconds) Tandem Stance - Right Leg: 15 (was 10 seconds)    Physical Therapy Assessment and Plan PT Assessment:  Pt has completed 4 weeks of PT for bilateral hip pain in which she was instructed in stretching and strengthening exercises as well as iontophoresis with dexamethasone. Pt is no longer experiencing pain in her hips and has improved overall functionally.  Pt has met 2/4 STG's and 2/4 LTG's.  Pt continues to have difficulty with her stability and  higher level balance activities.  Pt would benefit from continuing last 2 weeks with focus on higher level balance and stability exercises. PT Plan: Continue X 72moe weeks for high level balance activities.    Goals Home Exercise Program PT Goal: Perform Home Exercise Program - Progress: Met PT Short Term Goals Time to Complete Short Term Goals: 3 weeks PT Short Term Goal 1: Improve B hip abduction AROM to 35 degrees  PT Short Term Goal 1 - Progress: Partly met PT Short Term Goal 2: Improve B hip flexion to 115 degrees for flexibility duiring dressing and bathing  PT Short Term Goal 2 - Progress: Met PT Short Term Goal 3: patient walking 20 min wth max pain 2/10  PT Short Term Goal 3 - Progress: Partly met  PT Long Term Goals PT Long Term Goal 1: patient ambulation  25 min or more without increased pain for community mobility  within 6 weeks  PT Long Term Goal 1 - Progress: Progressing toward goal PT Long Term Goal 2: Patient B hip flexion MMT 4/5 for tranfsers and bed mobility ease  PT Long Term Goal 2 - Progress: Met Long Term Goal 3: Patient indpendent with HEP for B hip flexibility and strengthening to manage hip OA at discharge  Long Term Goal 3 Progress: Progressing toward goal Long Term Goal 4: pateint to report mild to no difficulty with basic household work  Long Term Goal 4 Progress: Met Long Term Goal 5: Patient will be able to single leg  stand for >20seconds bilaterally with multiplanar reaches  without loss of balance  Problem List Patient Active Problem List   Diagnosis Date Noted  . Bilateral hip pain 10/14/2013  . Status post right knee replacement 10/10/2013  . Status post left knee replacement 10/10/2013  . Degenerative arthritis of knee, bilateral 10/10/2013  . Degenerative arthritis of hip 10/10/2013  . S/P carpal tunnel release 05/22/2012  . CTS (carpal tunnel syndrome) 02/29/2012  . S/P total knee replacement 08/10/2011  . TOTAL KNEE FOLLOW-UP 08/04/2010  .  IMPINGEMENT SYNDROME 10/11/2007  . KNEE PAIN 05/31/2007  . OSTEOARTHRITIS, LOWER LEG 05/03/2007    PT - End of Session Activity Tolerance: Patient tolerated treatment well General Behavior During Therapy: WFL for tasks assessed/performed PT Plan of Care Consulted and Agree with Plan of Care: Patient  GP Functional Assessment Tool Used: FOTO 47% (was 46%) / 53% (was 54%) limitations  mobility  Functional Limitation: Mobility: Walking and moving around Mobility: Walking and Moving Around Current Status (L9767): At least 40 percent but less than 60 percent impaired, limited or restricted Mobility: Walking and Moving Around Goal Status [phone]): At least 20 percent but less than 40 percent impaired, limited or restricted  Teena Irani, PTA/CLT 11/14/2013, 9:47 AM  Devona Konig PT DPT 12:13

## 2013-11-18 ENCOUNTER — Ambulatory Visit (HOSPITAL_COMMUNITY)
Admission: RE | Admit: 2013-11-18 | Discharge: 2013-11-18 | Disposition: A | Discharge status: Home / Self Care | Payer: Medicare Other | Source: Ambulatory Visit | Attending: Orthopedic Surgery | Admitting: Orthopedic Surgery

## 2013-11-18 DIAGNOSIS — M25551 Pain in right hip: Secondary | ICD-10-CM

## 2013-11-18 DIAGNOSIS — M25552 Pain in left hip: Principal | ICD-10-CM

## 2013-11-18 NOTE — Progress Notes (Signed)
Physical Therapy Treatment Patient Details  Name: Audrey FinchBarbara K Marquez MRN: [id number] Date of Birth: October 04, 1950  Today's Date: 11/18/2013 Time: [id number] PT Time Calculation (min): 39 min Charge: NMR [id number], TE 1006-1016  Visit#: 11 of 14  Re-eval: 11/28/13 Assessment Diagnosis: Bil hip arthritis  Next MD Visit: Dr Romeo AppleHarrison unscheduled Prior Therapy: no   Authorization: Medicare   Authorization Time Period:    Authorization Visit#: 11 of 20   Subjective: Symptoms/Limitations Symptoms: Pain free today continues to c/o her hip "slipping" during gait.  Main problem with balance Pain Assessment Currently in Pain?: No/denies  Objective:   Exercise/Treatments Aerobic Stationary Bike: NuStep 10' level 4 hills 3 Standing Heel Raises: Limitations Heel Raises Limitations: heel and toe walking 2RT Lunge Walking - Round Trips: 2RT Stairs:    Manufacturing systems engineerocker Board: 2 minutes;Limitations Manufacturing systems engineerocker Board Limitations: R/L, A/P and level for 1 minute SLS: Rt 26", Lt 21" max of 4 Gait Training: tandem, sidestepping and retro gait 2RT balance beam Other Standing Knee Exercises: on airex high march and hold 5" 10 reps   Physical Therapy Assessment and Plan PT Assessment and Plan Clinical Impression Statement: Progressed balance activities to dynamic surfaces with min assistance required, less assistance required following cueing for spatial awareness.  Noted weak gluteal mm with high march, sidestepping and retro gait.  PT Plan: Continue with high level balance activtiies; next session begin hip hikes and vector stance.    Goals PT Short Term Goals Time to Complete Short Term Goals: 3 weeks PT Short Term Goal 1: Improve B hip abduction AROM to 35 degrees  PT Short Term Goal 1 - Progress: Progressing toward goal PT Short Term Goal 2: Improve B hip flexion to 115 degrees for flexibility duiring dressing and bathing  PT Short Term Goal 3: patient walking 20 min wth max pain 2/10  PT Short Term  Goal 3 - Progress: Progressing toward goal PT Long Term Goals PT Long Term Goal 1: patient ambulation  25 min or more without increased pain for community mobility  within 6 weeks  PT Long Term Goal 2: Patient B hip flexion MMT 4/5 for tranfsers and bed mobility ease  Long Term Goal 3: Patient indpendent with HEP for B hip flexibility and strengthening to manage hip OA at discharge  Long Term Goal 3 Progress: Progressing toward goal Long Term Goal 4: pateint to report mild to no difficulty with basic household work   Problem List Patient Active Problem List   Diagnosis Date Noted  . Bilateral hip pain 10/14/2013  . Status post right knee replacement 10/10/2013  . Status post left knee replacement 10/10/2013  . Degenerative arthritis of knee, bilateral 10/10/2013  . Degenerative arthritis of hip 10/10/2013  . S/P carpal tunnel release 05/22/2012  . CTS (carpal tunnel syndrome) 02/29/2012  . S/P total knee replacement 08/10/2011  . TOTAL KNEE FOLLOW-UP 08/04/2010  . IMPINGEMENT SYNDROME 10/11/2007  . KNEE PAIN 05/31/2007  . OSTEOARTHRITIS, LOWER LEG 05/03/2007    PT - End of Session Activity Tolerance: Patient tolerated treatment well General Behavior During Therapy: [facility] for tasks assessed/performed  GP    Audrey Burrowasey Jo Elda Marquez 11/18/2013, 12:29 PM

## 2013-11-20 ENCOUNTER — Ambulatory Visit (HOSPITAL_COMMUNITY)
Admission: RE | Admit: 2013-11-20 | Discharge: 2013-11-20 | Disposition: A | Discharge status: Home / Self Care | Payer: Medicare Other | Source: Ambulatory Visit | Attending: Orthopedic Surgery | Admitting: Orthopedic Surgery

## 2013-11-20 DIAGNOSIS — M25552 Pain in left hip: Principal | ICD-10-CM

## 2013-11-20 DIAGNOSIS — M25551 Pain in right hip: Secondary | ICD-10-CM

## 2013-11-20 NOTE — Progress Notes (Signed)
Physical Therapy Treatment Patient Details  Name: [name] MRN: [id number] Date of Birth: 1951/01/07  Today's Date: 11/20/2013 Time: 0938-1022 PT Time Calculation (min): 44 min  Visit#: 12 of 14  Re-eval: 11/28/13 Authorization: Medicare   Authorization Visit#: 12 of 20  Charges:  therex 42'  Subjective: Symptoms/Limitations Symptoms: Pt stated she was sore today,, pain scale 4-5/10 Pain Assessment Currently in Pain?: Yes Pain Score: 5  Pain Location: Hip Pain Orientation: Right;Left   Exercise/Treatments Aerobic Stationary Bike: NuStep 10' level 4 hills 3 Standing Heel Raises: Limitations Heel Raises Limitations: heel and toe walking 2RT Lunge Walking - Round Trips: Geneticist, molecular Board: 2 minutes;Limitations Diplomatic Services operational officer Limitations: R/L, A/P and level for 1 minute SLS with Vectors: 5X5" holds bilaterally Gait Training: tandem, sidestepping and retro gait 2RT balance beam Other Standing Knee Exercises: on airex high march and hold 5" 10 reps Other Standing Knee Exercises: hip hikes 10 reps each    Physical Therapy Assessment and Plan PT Assessment:  Added hip hikes and vector stance per PT POC.  Pt with overall good form requiring little cues.  Able to complete vector stance without UE assist.  Retro tandem on balance beam most difficult for patient, however able to self correct LOB. PT Plan: Continue with high level balance activties X 2 more sessions.   Goals Home Exercise Program PT Goal: Perform Home Exercise Program - Progress: Met PT Short Term Goals Time to Complete Short Term Goals: 3 weeks PT Short Term Goal 1: Improve B hip abduction AROM to 35 degrees  PT Short Term Goal 1 - Progress: Progressing toward goal PT Short Term Goal 2: Improve B hip flexion to 115 degrees for flexibility duiring dressing and bathing  PT Short Term Goal 2 - Progress: Met PT Short Term Goal 3: patient walking 20 min wth max pain 2/10  PT Short Term Goal 3 - Progress:  Progressing toward goal  PT Long Term Goals PT Long Term Goal 1: patient ambulation  25 min or more without increased pain for community mobility  within 6 weeks  PT Long Term Goal 1 - Progress: Progressing toward goal PT Long Term Goal 2: Patient B hip flexion MMT 4/5 for tranfsers and bed mobility ease  PT Long Term Goal 2 - Progress: Met Long Term Goal 3: Patient indpendent with HEP for B hip flexibility and strengthening to manage hip OA at discharge  Long Term Goal 3 Progress: Progressing toward goal Long Term Goal 4: pateint to report mild to no difficulty with basic household work  Long Term Goal 4 Progress: Met  Problem List Patient Active Problem List   Diagnosis Date Noted  . Bilateral hip pain 10/14/2013  . Status post right knee replacement 10/10/2013  . Status post left knee replacement 10/10/2013  . Degenerative arthritis of knee, bilateral 10/10/2013  . Degenerative arthritis of hip 10/10/2013  . S/P carpal tunnel release 05/22/2012  . CTS (carpal tunnel syndrome) 02/29/2012  . S/P total knee replacement 08/10/2011  . TOTAL KNEE FOLLOW-UP 08/04/2010  . IMPINGEMENT SYNDROME 10/11/2007  . KNEE PAIN 05/31/2007  . OSTEOARTHRITIS, LOWER LEG 05/03/2007    PT - End of Session Activity Tolerance: Patient tolerated treatment well General Behavior During Therapy: WFL for tasks assessed/performed   Teena Irani, PTA/CLT 11/20/2013, 10:24 AM

## 2013-11-25 ENCOUNTER — Ambulatory Visit (INDEPENDENT_AMBULATORY_CARE_PROVIDER_SITE_OTHER): Payer: Medicare Other | Admitting: Adult Health

## 2013-11-25 ENCOUNTER — Other Ambulatory Visit (HOSPITAL_COMMUNITY)
Admission: RE | Admit: 2013-11-25 | Discharge: 2013-11-25 | Disposition: A | Discharge status: Home / Self Care | Payer: Medicare Other | Source: Ambulatory Visit | Attending: Adult Health | Admitting: Adult Health

## 2013-11-25 ENCOUNTER — Encounter: Payer: Self-pay | Admitting: Adult Health

## 2013-11-25 VITALS — BP 122/70 | HR 76 | Ht 64.5 in | Wt 201.0 lb

## 2013-11-25 DIAGNOSIS — Z1151 Encounter for screening for human papillomavirus (HPV): Secondary | ICD-10-CM | POA: Insufficient documentation

## 2013-11-25 DIAGNOSIS — Z01419 Encounter for gynecological examination (general) (routine) without abnormal findings: Secondary | ICD-10-CM

## 2013-11-25 DIAGNOSIS — Z124 Encounter for screening for malignant neoplasm of cervix: Secondary | ICD-10-CM

## 2013-11-25 DIAGNOSIS — Z1212 Encounter for screening for malignant neoplasm of rectum: Secondary | ICD-10-CM

## 2013-11-25 NOTE — Progress Notes (Signed)
Patient ID: Suszanne FinchBarbara K Clontz, female   DOB: May 06, 1951, 63 y.o.   MRN: [id number] History of Present Illness: Britta MccreedyBarbara is a 63 year old white female,married in for a pap and physical.   Current Medications, Allergies, Past Medical History, Past Surgical History, Family History and Social History were reviewed in [facility] electronic medical record.   Past Medical History  Diagnosis Date  . High cholesterol   . Arthritis   . Anxiety   . Carpal tunnel syndrome on both sides    Past Surgical History  Procedure Laterality Date  . Knee surgery    . Laminectomy    . Appendectomy    . Joint replacement  2008    bilateral knees, [facility]; [place]  . Bunionectomy  09/29/2011    Procedure: Arbutus LeasBUNIONECTOMY;  Surgeon: [name], DPM;  Location: [facility];  Service: Orthopedics;  Laterality: Right;  [place] Bunionectomy Right Foot  . Carpal tunnel release  04/13/2012    Procedure: CARPAL TUNNEL RELEASE;  Surgeon: Vickki HearingStanley E [place], MD;  Location: [facility];  Service: Orthopedics;  Laterality: Right;  Current outpatient prescriptions:ALPRAZolam (XANAX) 1 MG tablet, Take 1 mg by mouth at bedtime as needed. For sleep, Disp: , Rfl: ;  aspirin EC 81 MG tablet, Take 81 mg by mouth daily., Disp: , Rfl: ;  docusate sodium (COLACE) 100 MG capsule, Take 100 mg by mouth 2 (two) times daily., Disp: , Rfl: ;  ibuprofen (ADVIL,MOTRIN) 800 MG tablet, Take 800 mg by mouth every 8 (eight) hours as needed., Disp: , Rfl:  simvastatin (ZOCOR) 40 MG tablet, Take 40 mg by mouth every evening., Disp: , Rfl: ;  HYDROcodone-acetaminophen (NORCO) 10-325 MG per tablet, Take 1 tablet by mouth every 6 (six) hours as needed., Disp: , Rfl: ;  ibuprofen (ADVIL,MOTRIN) 200 MG tablet, Take 400 mg by mouth every 6 (six) hours as needed. For pain, Disp: , Rfl: ;  phentermine 37.5 MG capsule, Take 37.5 mg by mouth every morning., Disp: , Rfl:   Review of Systems: Patient denies any headaches, blurred vision, shortness of breath, chest  pain, abdominal pain, problems with bowel movements, urination, or intercourse.Not having sex,has pain in both knees and hips and goes to PT, no mood swings takes meds when needed,from Dr Sudie BaileyKnowlton.    Physical Exam:BP 122/70  Pulse 76  Ht 5' 4.5" (1.638 m)  Wt 201 lb (91.173 kg)  BMI 33.98 kg/m2 General:  Well developed, well nourished, no acute distress Skin:  Warm and dry,tan Neck:  Midline trachea, normal thyroid Lungs; Clear to auscultation bilaterally Breast:  No dominant palpable mass, retraction, or nipple discharge Cardiovascular: Regular rate and rhythm Abdomen:  Soft, non tender, no hepatosplenomegaly Pelvic:  External genitalia is normal in appearance.  The vagina has decrease color,moisture and rugae.  The cervix is atrophic and stenotic at os,pap with HPV performed.  Uterus is felt to be normal size, shape, and contour.  No   adnexal masses or tenderness noted. Rectal: Good sphincter tone, no polyps, or hemorrhoids felt.  Hemoccult negative. Extremities:  No swelling or varicosities noted Psych:  No mood changes,alert and cooperative, seems happy   Impression: Yearly gyn exam    Plan:  Physical in 2 years Mammogram yearly  Labs with PCP Colonoscopy advised

## 2013-11-25 NOTE — Patient Instructions (Signed)
Physical in 2 year Mammogram yearly  Colonoscopy advised  Labs with PCP 

## 2013-11-26 ENCOUNTER — Ambulatory Visit (HOSPITAL_COMMUNITY)
Admission: RE | Admit: 2013-11-26 | Discharge: 2013-11-26 | Disposition: A | Discharge status: Home / Self Care | Payer: Medicare Other | Source: Ambulatory Visit | Attending: Family Medicine | Admitting: Family Medicine

## 2013-11-26 DIAGNOSIS — IMO0001 Reserved for inherently not codable concepts without codable children: Secondary | ICD-10-CM | POA: Diagnosis present

## 2013-11-26 DIAGNOSIS — M25659 Stiffness of unspecified hip, not elsewhere classified: Secondary | ICD-10-CM | POA: Diagnosis not present

## 2013-11-26 DIAGNOSIS — M25552 Pain in left hip: Secondary | ICD-10-CM

## 2013-11-26 DIAGNOSIS — M25559 Pain in unspecified hip: Secondary | ICD-10-CM | POA: Diagnosis not present

## 2013-11-26 DIAGNOSIS — M25551 Pain in right hip: Secondary | ICD-10-CM

## 2013-11-26 NOTE — Progress Notes (Signed)
Physical Therapy Treatment Patient Details  Name: Audrey FinchBarbara K Marquez MRN: [id number] Date of Birth: 01-11-1951  Today's Date: 11/26/2013 Time: [id number] PT Time Calculation (min): 40 min Charge: NMR [id number], TE [id number]  Visit#: 13 of 14  Re-eval: 11/28/13 Assessment Diagnosis: Bil hip arthritis  Next MD Visit: Dr Romeo AppleHarrison unscheduled Prior Therapy: no   Authorization: Medicare   Authorization Time Period:    Authorization Visit#: 13 of 20   Subjective: Symptoms/Limitations Symptoms: Pt stated she was sore following PT last week, currently pain free.   Pain Assessment Currently in Pain?: No/denies  Objective:   Exercise/Treatments Aerobic Stationary Bike: NuStep 10' level 4 hills 3 Standing Heel Raises: Limitations Heel Raises Limitations: heel and toe walking 2RT Functional Squat: Limitations Functional Squat Limitations: sidestep and squat 1RT Lunge Walking - Round Trips: Education officer, environmental2RT Rocker Board: 2 minutes;Limitations Manufacturing systems engineerocker Board Limitations: R/L, A/P and level for 1 minute SLS with Vectors: 5X5" holds bilaterally Gait Training: tandem and retro gait 2RT balance beam Other Standing Knee Exercises: on airex high march and hold 5" 10 reps Other Standing Knee Exercises: hip hikes 10 reps x 5" on 2 in step    Physical Therapy Assessment and Plan PT Assessment and Plan Clinical Impression Statement: Pt progressing well to balance activities on dynamic surfaces, pt able to self correct LOB episodes independently with SBA this session.  Added sidestepping with squat for gluteal strengthening for high level balance training.   PT Plan: Re-eval next session     Goals Home Exercise Program Pt/caregiver will Perform Home Exercise Program: For increased ROM PT Short Term Goals Time to Complete Short Term Goals: 3 weeks PT Short Term Goal 1: Improve B hip abduction AROM to 35 degrees  PT Short Term Goal 2: Improve B hip flexion to 115 degrees for flexibility duiring dressing and  bathing  PT Short Term Goal 3: patient walking 20 min wth max pain 2/10  PT Short Term Goal 3 - Progress: Progressing toward goal PT Long Term Goals PT Long Term Goal 1: patient ambulation  25 min or more without increased pain for community mobility  within 6 weeks  PT Long Term Goal 1 - Progress: Progressing toward goal PT Long Term Goal 2: Patient B hip flexion MMT 4/5 for tranfsers and bed mobility ease  Long Term Goal 3: Patient indpendent with HEP for B hip flexibility and strengthening to manage hip OA at discharge  Long Term Goal 3 Progress: Progressing toward goal Long Term Goal 4: pateint to report mild to no difficulty with basic household work   Problem List Patient Active Problem List   Diagnosis Date Noted  . Bilateral hip pain 10/14/2013  . Status post right knee replacement 10/10/2013  . Status post left knee replacement 10/10/2013  . Degenerative arthritis of knee, bilateral 10/10/2013  . Degenerative arthritis of hip 10/10/2013  . S/P carpal tunnel release 05/22/2012  . CTS (carpal tunnel syndrome) 02/29/2012  . S/P total knee replacement 08/10/2011  . TOTAL KNEE FOLLOW-UP 08/04/2010  . IMPINGEMENT SYNDROME 10/11/2007  . KNEE PAIN 05/31/2007  . OSTEOARTHRITIS, LOWER LEG 05/03/2007    PT - End of Session Activity Tolerance: Patient tolerated treatment well General Behavior During Therapy: [facility]WFL for tasks assessed/performed  GP    [name] 11/26/2013, 9:10 AM

## 2013-11-28 ENCOUNTER — Ambulatory Visit (HOSPITAL_COMMUNITY)
Admission: RE | Admit: 2013-11-28 | Discharge: 2013-11-28 | Disposition: A | Discharge status: Home / Self Care | Payer: Medicare Other | Source: Ambulatory Visit | Attending: Family Medicine | Admitting: Family Medicine

## 2013-11-28 DIAGNOSIS — M25551 Pain in right hip: Secondary | ICD-10-CM

## 2013-11-28 DIAGNOSIS — IMO0001 Reserved for inherently not codable concepts without codable children: Secondary | ICD-10-CM | POA: Diagnosis not present

## 2013-11-28 DIAGNOSIS — M25552 Pain in left hip: Principal | ICD-10-CM

## 2013-11-28 NOTE — Evaluation (Addendum)
Physical Therapy Re-assessment  Patient Details  Name: [name] MRN: [id number] Date of Birth: 08-22-1950  Today's Date: 11/28/2013 Time: 0930-1015 PT Time Calculation (min): 45 min   TherEx [phone], manual Therapy [phone], therAct [phone]           Visit#: 14 of 20  Re-eval: 12/28/13 Assessment Diagnosis: Bil hip arthritis  Next MD Visit: Dr Aline Brochure unscheduled Prior Therapy: no   Authorization: Medicare     Authorization Time Period:    Authorization Visit#: 62 of 20   Past Medical History:  Past Medical History  Diagnosis Date  . High cholesterol   . Arthritis   . Anxiety   . Carpal tunnel syndrome on both sides    Past Surgical History:  Past Surgical History  Procedure Laterality Date  . Knee surgery    . Laminectomy    . Appendectomy    . Joint replacement  2008    bilateral knees, [facility]; [place]  . Bunionectomy  09/29/2011    Procedure: Lillard Anes;  Surgeon: Marcheta Grammes, DPM;  Location: [facility];  Service: Orthopedics;  Laterality: Right;  [place] Bunionectomy Right Foot  . Carpal tunnel release  04/13/2012    Procedure: CARPAL TUNNEL RELEASE;  Surgeon: Carole Civil, MD;  Location: [facility];  Service: Orthopedics;  Laterality: Right;    Subjective Symptoms/Limitations Symptoms: Patient states she is having no pain today notes knee feels like it is catching. Patient states she is ready for therapy to be finished., but she is still unable to ambulate for >31mnutes withotu needing to rest due to her hips feeling very sore. Patient states she contineus to have moderate difficulty going up and down stairs requiring bilateral UE support to perform multiple stairs. Patient  feels that there are more things she would like to do better that she was just 6 months ago such as stairs, walking, standing but she is unsure how much PT can help her.  patient feels electrostimulation significantly decreased her pain/  Pertinent History: B knee replacements 2008  , onset of B   hip/thigh pain about 2-3 months ago around 07/26/2013  How long can you sit comfortably?: unlimited, initially knee is stiff following prolonged sitting How long can you stand comfortably?: less than 28mutes How long can you walk comfortably?: 2029mtes, with multiple reptitions Special Tests: hip x rays moderate arthritis  Patient Stated Goals: decrease pain, improve walking  Pain Assessment Currently in Pain?: No/denies Pain Score: 0-No pain  Precautions/Restrictions    history of falls  Balance Screening Balance Screen Has the patient fallen in the past 6 months:  (No falls since beginning therapy. )  Prior Functioning  Prior Function Level of Independence: Independent with basic ADLs  Sensation/Coordination/Flexibility/Functional Tests Functional Tests Functional Tests: Gait  Assessment RLE AROM (degrees) Right Hip Flexion: 110 Right Hip ABduction: 20 Right Knee Flexion: 120 RLE PROM (degrees) Right Hip ABduction: 35 RLE Strength Right Hip Flexion: 5/5 (was 3/5) Right Hip ABduction:  (4/5) Right Knee Flexion: 5/5 (was 4/5) Right Knee Extension:  (was 4+/5) LLE AROM (degrees) Left Hip Flexion: 95 Left Hip ABduction: 25 LLE PROM (degrees) Left Hip ABduction: 40 (was 45) Left Knee Flexion: 120 LLE Strength Left Hip Flexion: 5/5 (was 3+/5) Left Hip ABduction: 5/5 (was 4/5) Left Knee Flexion: 5/5 (was 4/5) Left Knee Extension: 4/5 (was 4/5) Palpation Palpation: minor "soreness"  Exercise/Treatments Mobility/Balance  Static Standing Balance Single Leg Stance - Right Leg: 20 Single Leg Stance - Left Leg: 16 Tandem Stance -  Right Leg: 20  Practice ambulation in and out of bath tub 4x  Stretches  Calf stretch 3 way 10x 3 second hold Standing   Sit to stand 10x Sidelying   stright leg raise 10x each  Manual therapy: hip medial lateral and lateral medial grade 2 mobilizations, Knee anterior-posterior and posterior-anterior grade 2  mobilizations  Physical Therapy Assessment and Plan PT Assessment and Plan Clinical Impression Statement: Patient is making slow but steady progress. At end ovf session patient states that she would like to conitnue therapy to meet long term goals of returning to ambulating >23mnutes, walkign up stairs, and decrease risk of falls as she currently feels she limites her activity because of her fear of falling.  Therapist is in aggreaance as patient  contineus to show signs  of imptovement but need sfor contineud therapy to progress hib abduction strength. Contineu skilled PT 2x a week for 3 weeks to meet long term goals Pt will benefit from skilled therapeutic intervention in order to improve on the following deficits: Decreased balance;Decreased mobility;Pain;Decreased range of motion;Impaired flexibility;Decreased strength Rehab Potential: Good Clinical Impairments Affecting Rehab Potential: moderate hip OA PT Frequency: Min 2X/week PT Duration: 6 weeks (3 more weeks) PT Treatment/Interventions: Gait training;Functional mobility training;Therapeutic activities;Therapeutic exercise;Manual techniques;Patient/family education;Neuromuscular re-education;Modalities PT Plan: Continue skilled PT 3 more weeks 2x a week to meet long term goals. Progress exercises to include more dynamic activities to progress hip abduction strength and further progress balance to decrease risk of falls: side lunges, step ups, and walking up and down hills outside    Goals Home Exercise Program Pt/caregiver will Perform Home Exercise Program: For increased ROM PT Goal: Perform Home Exercise Program - Progress: Met PT Short Term Goals PT Short Term Goal 1: Improve B hip abduction AROM to 35 degrees  PT Short Term Goal 1 - Progress: Progressing toward goal PT Short Term Goal 2: Improve B hip flexion to 115 degrees for flexibility duiring dressing and bathing  PT Short Term Goal 2 - Progress: Met PT Short Term Goal 3:  patient walking 20 min wth max pain 2/10  PT Short Term Goal 3 - Progress: Met PT Long Term Goals Time to Complete Long Term Goals: 4 weeks PT Long Term Goal 1: patient ambulation  25 min or more without increased pain for community mobility  within 6 weeks  PT Long Term Goal 1 - Progress: Progressing toward goal PT Long Term Goal 2: Patient B hip flexion MMT 4/5 for tranfsers and bed mobility ease  PT Long Term Goal 2 - Progress: Met Long Term Goal 3: Patient indpendent with HEP for B hip flexibility and strengthening to manage hip OA at discharge  Long Term Goal 3 Progress: Met Long Term Goal 4: pateint to report mild to no difficulty with basic household work  Long Term Goal 4 Progress: Met PT Long Term Goal 5: Patient will be able to ambualte up  flight of stairs with only 1 hand rail assist for balance reporting mild difficulty at most.  Long Term Goal 5 Progress: Progressing toward goal Additional PT Long Term Goals?: Yes PT Long Term Goal 6: Patient will demosnstrate hip abduction strength Bilaterally of 4+/5 so patient can ambulate > 33mutes without reporting hip soreness.  Long Term Goal 6 Progress: Progressing toward goal  Problem List Patient Active Problem List   Diagnosis Date Noted  . Bilateral hip pain 10/14/2013  . Status post right knee replacement 10/10/2013  . Status post left knee replacement  10/10/2013  . Degenerative arthritis of knee, bilateral 10/10/2013  . Degenerative arthritis of hip 10/10/2013  . S/P carpal tunnel release 05/22/2012  . CTS (carpal tunnel syndrome) 02/29/2012  . S/P total knee replacement 08/10/2011  . TOTAL KNEE FOLLOW-UP 08/04/2010  . IMPINGEMENT SYNDROME 10/11/2007  . KNEE PAIN 05/31/2007  . OSTEOARTHRITIS, LOWER LEG 05/03/2007    PT - End of Session Activity Tolerance: Patient tolerated treatment well General Behavior During Therapy: WFL for tasks assessed/performed  GP Functional Assessment Tool Used: FOTO 47% (was 46%) / 53%  (was 54%) limitations  mobility and clinical judgement Functional Limitation: Mobility: Walking and moving around Mobility: Walking and Moving Around Current Status (Z6109): At least 40 percent but less than 60 percent impaired, limited or restricted Mobility: Walking and Moving Around Goal Status [phone]): At least 20 percent but less than 40 percent impaired, limited or restricted  Leia Alf 11/28/2013, 10:56 AM  Physician Documentation Your signature is required to indicate approval of the treatment plan as stated above.  Please sign and either send electronically or make a copy of this report for your files and return this physician signed original.   Please mark one 1.__approve of plan  2. ___approve of plan with the following conditions.   ______________________________                                                          _____________________ Physician Signature                                                                                                             Date

## 2013-12-05 ENCOUNTER — Ambulatory Visit (HOSPITAL_COMMUNITY): Payer: Medicare Other | Admitting: Physical Therapy

## 2013-12-05 ENCOUNTER — Telehealth (HOSPITAL_COMMUNITY): Payer: Self-pay

## 2013-12-10 ENCOUNTER — Ambulatory Visit (HOSPITAL_COMMUNITY)
Admission: RE | Admit: 2013-12-10 | Discharge: 2013-12-10 | Disposition: A | Discharge status: Home / Self Care | Payer: Medicare Other | Source: Ambulatory Visit | Attending: Family Medicine | Admitting: Family Medicine

## 2013-12-10 DIAGNOSIS — M25552 Pain in left hip: Principal | ICD-10-CM

## 2013-12-10 DIAGNOSIS — M25551 Pain in right hip: Secondary | ICD-10-CM

## 2013-12-10 DIAGNOSIS — IMO0001 Reserved for inherently not codable concepts without codable children: Secondary | ICD-10-CM | POA: Diagnosis not present

## 2013-12-10 NOTE — Progress Notes (Signed)
Physical Therapy Treatment Patient Details  Name: Audrey FinchBarbara K Marquez MRN: [id number] Date of Birth: 1951/07/25  Today's Date: 12/10/2013 Time: 0933-1020 PT Time Calculation (min): 47 min Charge: TE [id number]  Visit#: 15 of 20  Re-eval: 12/28/13 Assessment Diagnosis: Bil hip arthritis  Next MD Visit: Dr Romeo AppleHarrison unscheduled Prior Therapy: no   Authorization: Medicare   Authorization Time Period:    Authorization Visit#: 15 of 20   Subjective: Symptoms/Limitations Symptoms: Pt stated rough week with daughter's wedding, pt still feels like her hips are slipping while walking or getting in/out of car. Pain Assessment Currently in Pain?: No/denies  Objective:  Exercise/Treatments Aerobic Stationary Bike: NuStep 10' level 4 hills 3 Standing Side Lunges: Both;10 reps Lateral Step Up: Both;10 reps;Hand Hold: 0;Step Height: 6" Forward Step Up: Both;15 reps;Hand Hold: 0;Step Height: 6" Functional Squat: 2 sets;10 reps;Limitations Functional Squat Limitations: 3D hip excursion Rocker Board: 2 minutes;Limitations Rocker Board Limitations: R/L, A/P and level for 1 minute Gait Training: outdoor gait around dept incline/decline slopes, grass hills, stairs and curbs 8 min Other Standing Knee Exercises: on airex high march and hold 5" 10 reps Other Standing Knee Exercises: Hip abduction against wall 10x bil  Physical Therapy Assessment and Plan PT Assessment and Plan Clinical Impression Statement: Today's session focus on functional strengtheing activtiies including stair training and hip mobility exercises.  Pt able to complete all exercises correctly with minor cueing for form and technique with no c/o pain through session.  Pt did c./o hip slipping during weight shifting hip mobilty exercise.  Began outdoor gait training with no LOB episodes noted, pt able to ambulature on incline/decline slopes, through grass on hills, curls and reciprocal pattern stairs ascending, used step to pattern  descending for knee pain. PT Plan: Continue 5 more sessions to improve dynamic balance activties, hip abduction strengthening and to improve hip mobility.  Begin SFT squats next session to improve hip mobiltiy.  Continue with side lunges, stair training, and outdoor gait training with hills.    Goals Home Exercise Program Pt/caregiver will Perform Home Exercise Program: For increased ROM PT Short Term Goals PT Short Term Goal 1: Improve B hip abduction AROM to 35 degrees  PT Short Term Goal 1 - Progress: Progressing toward goal PT Short Term Goal 2: Improve B hip flexion to 115 degrees for flexibility duiring dressing and bathing  PT Short Term Goal 3: patient walking 20 min wth max pain 2/10  PT Long Term Goals Time to Complete Long Term Goals: 4 weeks PT Long Term Goal 1: patient ambulation  25 min or more without increased pain for community mobility  within 6 weeks  PT Long Term Goal 1 - Progress: Progressing toward goal PT Long Term Goal 2: Patient B hip flexion MMT 4/5 for tranfsers and bed mobility ease  Long Term Goal 3: Patient indpendent with HEP for B hip flexibility and strengthening to manage hip OA at discharge  Long Term Goal 4: pateint to report mild to no difficulty with basic household work  PT Long Term Goal 5: Patient will be able to ambualte up  flight of stairs with only 1 hand rail assist for balance reporting mild difficulty at most.  Long Term Goal 5 Progress: Progressing toward goal Additional PT Long Term Goals?: Yes PT Long Term Goal 6: Patient will demosnstrate hip abduction strength Bilaterally of 4+/5 so patient can ambulate > 30minutes without reporting hip soreness.  Long Term Goal 6 Progress: Progressing toward goal  Problem List Patient Active  Problem List   Diagnosis Date Noted  . Bilateral hip pain 10/14/2013  . Status post right knee replacement 10/10/2013  . Status post left knee replacement 10/10/2013  . Degenerative arthritis of knee, bilateral  10/10/2013  . Degenerative arthritis of hip 10/10/2013  . S/P carpal tunnel release 05/22/2012  . CTS (carpal tunnel syndrome) 02/29/2012  . S/P total knee replacement 08/10/2011  . TOTAL KNEE FOLLOW-UP 08/04/2010  . IMPINGEMENT SYNDROME 10/11/2007  . KNEE PAIN 05/31/2007  . OSTEOARTHRITIS, LOWER LEG 05/03/2007    PT - End of Session Activity Tolerance: Patient tolerated treatment well General Behavior During Therapy: [facility] for tasks assessed/performed  GP    [name] 12/10/2013, 10:28 AM

## 2013-12-12 ENCOUNTER — Ambulatory Visit (HOSPITAL_COMMUNITY)
Admission: RE | Admit: 2013-12-12 | Discharge: 2013-12-12 | Disposition: A | Discharge status: Home / Self Care | Payer: Medicare Other | Source: Ambulatory Visit | Attending: Family Medicine | Admitting: Family Medicine

## 2013-12-12 ENCOUNTER — Telehealth: Payer: Self-pay | Admitting: Orthopedic Surgery

## 2013-12-12 DIAGNOSIS — IMO0001 Reserved for inherently not codable concepts without codable children: Secondary | ICD-10-CM | POA: Diagnosis not present

## 2013-12-12 NOTE — Progress Notes (Signed)
Physical Therapy Re-evaluation / Discharge  Patient Details  Name: [name] MRN: [id number] Date of Birth: 1950-11-10  Today's Date: 12/12/2013 Time: 0932-1020 PT Time Calculation (min): 48 min              Visit#: 16 of 20  Assessment Diagnosis: Bil hip arthritis  Next MD Visit: Dr Aline Brochure unscheduled Authorization: Medicare   Charges:  therex [phone] (24'), self care [phone] (20')    Subjective Pt states she is frustrated with her lack of progress.  Pt states she continues to have "catching" in her LT hip, especially when she bends forward to retrieve item from floor.  Pt reports her Rt hip feels like it is "slipping" with certain activites.  Most of her pain continues into her anterior and lateral thighs and into buttocks.  Pt feels like her pain may be coming from her lumbar region but doesn't know.    Objective Assessment RLE AROM (degrees) Right Hip Flexion: 110 Right Hip ABduction: 20 Right Knee Flexion: 120 RLE PROM (degrees) Right Hip ABduction: 35 RLE Strength Right Hip Flexion: 5/5 Right Hip ABduction: 4/5 Right Knee Flexion: 5/5 Right Knee Extension: 4/5 LLE AROM (degrees) Left Hip Flexion: 95 Left Hip ABduction: 25 LLE PROM (degrees) Left Hip ABduction: 40 Left Knee Flexion: 120 LLE Strength Left Hip Flexion: 5/5 Left Hip ABduction: 5/5 Left Knee Flexion: 5/5 Left Knee Extension: 4/5 Palpation Palpation: minor "soreness"  Exercise/Treatments Mobility/Balance  Static Standing Balance Single Leg Stance - Right Leg: 20 Single Leg Stance - Left Leg: 16 Tandem Stance - Right Leg: 20  Stretches Hip Flexor Stretch: 3 reps;10 seconds;Limitations Hip Flexor Stretch Limitations: standing using 6 in step B  Piriformis Stretch: 3 reps;30 seconds;Limitations Piriformis Stretch Limitations: seated Gastroc Stretch: 2 reps;30 seconds Aerobic Stationary Bike: NuStep 10' level 4 hills 3 Standing Forward Lunges: 10 reps Lateral Step Up: Both;10  reps;Hand Hold: 0;Step Height: 6" Other Standing Knee Exercises: Hip abduction against wall 10x bil    Physical Therapy Assessment and Plan PT Assessment:  Pt has completed 16 PT visits X 7 weeks for bilateral hip arthritis and dysfunction.  Pt with initial improvement in pain, however frustrated with the continued dysfunction in her bilateral hips.  PT reports functionally she is still unable to complete ADL's without extreme discomfort, unable to stand or sit extended period of time or walk up/down inclines without increased pain and symptoms.  Pt has met 3/4 STG's and 3/6 LTG's.  Pt given written HEP with demonstration and ability to perform correctly.  Pt intends to follow up with MD regarding continued symptoms. PT Plan: Discharge to HEP per patient request.  Counseled with patient and evaluating therapist and both are in agreement.      Goals Home Exercise Program Pt/caregiver will Perform Home Exercise Program: For increased ROM PT Goal: Perform Home Exercise Program - Progress: Met PT Short Term Goals PT Short Term Goal 1: Improve B hip abduction AROM to 35 degrees  PT Short Term Goal 1 - Progress: Partly met (met on the Rt; Lt is 20 degrees) PT Short Term Goal 2: Improve B hip flexion to 115 degrees for flexibility duiring dressing and bathing  PT Short Term Goal 2 - Progress: Met PT Short Term Goal 3: patient walking 20 min wth max pain 2/10  PT Short Term Goal 3 - Progress: Met PT Long Term Goals Time to Complete Long Term Goals: 4 weeks PT Long Term Goal 1: patient ambulation  25 min or more  without increased pain for community mobility  within 6 weeks  PT Long Term Goal 1 - Progress: Met PT Long Term Goal 2: Patient B hip flexion MMT 4/5 for tranfsers and bed mobility ease  PT Long Term Goal 2 - Progress: Met Long Term Goal 3: Patient indpendent with HEP for B hip flexibility and strengthening to manage hip OA at discharge  Long Term Goal 3 Progress: Met Long Term Goal 4:  pateint to report mild to no difficulty with basic household work  Long Term Goal 4 Progress: Partly met (patient cannot mop or vacuum without LBP or sit on side of bathtub to bathe grandchild) PT Long Term Goal 5: Patient will be able to ambualte up  flight of stairs with only 1 hand rail assist for balance reporting mild difficulty at most.  Long Term Goal 5 Progress: Not met Additional PT Long Term Goals?: Yes PT Long Term Goal 6: Patient will demosnstrate hip abduction strength Bilaterally of 4+/5 so patient can ambulate > 79mnutes without reporting hip soreness.  Long Term Goal 6 Progress: Partly met  Problem List Patient Active Problem List   Diagnosis Date Noted  . Bilateral hip pain 10/14/2013  . Status post right knee replacement 10/10/2013  . Status post left knee replacement 10/10/2013  . Degenerative arthritis of knee, bilateral 10/10/2013  . Degenerative arthritis of hip 10/10/2013  . S/P carpal tunnel release 05/22/2012  . CTS (carpal tunnel syndrome) 02/29/2012  . S/P total knee replacement 08/10/2011  . TOTAL KNEE FOLLOW-UP 08/04/2010  . IMPINGEMENT SYNDROME 10/11/2007  . KNEE PAIN 05/31/2007  . OSTEOARTHRITIS, LOWER LEG 05/03/2007    PT Plan of Care PT Home Exercise Plan: written HEP provided and scanned into chart.   Consulted and Agree with Plan of Care: Patient  GP Functional Assessment Tool Used: FOTO 47% (was 46%) / 53% (was 54%) limitations  mobility and clinical judgement Functional Limitation: Mobility: Walking and moving around Mobility: Walking and Moving Around Goal Status [phone]: At least 20 percent but less than 40 percent impaired, limited or restricted Mobility: Walking and Moving Around Discharge Status [phone]: At least 40 percent but less than 60 percent impaired, limited or restricted  ATeena Irani PTA/CLT 12/12/2013, 12:06 PM

## 2013-12-12 NOTE — Telephone Encounter (Signed)
Patient called back to relay that the physical therapy, which she has completed at [facility], helped her hips improve somewhat, but she is still having difficulty - getting in and out of car, and with other movements.  Schedule another appointment or other recommendation?  Her cell phone # is [phone]

## 2013-12-12 NOTE — Telephone Encounter (Signed)
Routing to Dr Harrison 

## 2013-12-12 NOTE — Progress Notes (Signed)
Physical Therapy Re-evaluation / Discharge  Patient Details  Name: [name] MRN: [id number] Date of Birth: Apr 04, 1951  Today's Date: 12/12/2013 Time: 0932-1020 PT Time Calculation (min): 48 min              Visit#: 16 of 20  Assessment Diagnosis: Bil hip arthritis  Next MD Visit: Dr Aline Brochure unscheduled Authorization: Medicare   Charges:  therex [phone] (24'), self care [phone] (20')    Subjective Pt states she is frustrated with her lack of progress.  Pt states she continues to have "catching" in her LT hip, especially when she bends forward to retrieve item from floor.  Pt reports her Rt hip feels like it is "slipping" with certain activites.  Most of her pain continues into her anterior and lateral thighs and into buttocks.  Pt feels like her pain may be coming from her lumbar region but doesn't know.    Objective Assessment RLE AROM (degrees) Right Hip Flexion: 110 Right Hip ABduction: 20 Right Knee Flexion: 120 RLE PROM (degrees) Right Hip ABduction: 35 RLE Strength Right Hip Flexion: 5/5 Right Hip ABduction: 4/5 Right Knee Flexion: 5/5 Right Knee Extension: 4/5 LLE AROM (degrees) Left Hip Flexion: 95 Left Hip ABduction: 25 LLE PROM (degrees) Left Hip ABduction: 40 Left Knee Flexion: 120 LLE Strength Left Hip Flexion: 5/5 Left Hip ABduction: 5/5 Left Knee Flexion: 5/5 Left Knee Extension: 4/5 Palpation Palpation: minor "soreness"  Exercise/Treatments Mobility/Balance  Static Standing Balance Single Leg Stance - Right Leg: 20 Single Leg Stance - Left Leg: 16 Tandem Stance - Right Leg: 20  Stretches Hip Flexor Stretch: 3 reps;10 seconds;Limitations Hip Flexor Stretch Limitations: standing using 6 in step B  Piriformis Stretch: 3 reps;30 seconds;Limitations Piriformis Stretch Limitations: seated Gastroc Stretch: 2 reps;30 seconds Aerobic Stationary Bike: NuStep 10' level 4 hills 3 Standing Forward Lunges: 10 reps Lateral Step Up: Both;10  reps;Hand Hold: 0;Step Height: 6" Other Standing Knee Exercises: Hip abduction against wall 10x bil    Physical Therapy Assessment and Plan PT Assessment:  Pt has completed 16 PT visits X 7 weeks for bilateral hip arthritis and dysfunction.  Pt with initial improvement in pain, however frustrated with the continued dysfunction in her bilateral hips.  PT reports functionally she is still unable to complete ADL's without extreme discomfort, unable to stand or sit extended period of time or walk up/down inclines without increased pain and symptoms.  Pt has met 3/4 STG's and 3/6 LTG's.  Pt given written HEP with demonstration and ability to perform correctly.  Pt intends to follow up with MD regarding continued symptoms. PT Plan: Discharge to HEP per patient request.  Counseled with patient and evaluating therapist and both are in agreement.      Goals Home Exercise Program Pt/caregiver will Perform Home Exercise Program: For increased ROM PT Goal: Perform Home Exercise Program - Progress: Met PT Short Term Goals PT Short Term Goal 1: Improve B hip abduction AROM to 35 degrees  PT Short Term Goal 1 - Progress: Partly met (met on the Rt; Lt is 20 degrees) PT Short Term Goal 2: Improve B hip flexion to 115 degrees for flexibility duiring dressing and bathing  PT Short Term Goal 2 - Progress: Met PT Short Term Goal 3: patient walking 20 min wth max pain 2/10  PT Short Term Goal 3 - Progress: Met PT Long Term Goals Time to Complete Long Term Goals: 4 weeks PT Long Term Goal 1: patient ambulation  25 min or more  without increased pain for community mobility  within 6 weeks  PT Long Term Goal 1 - Progress: Met PT Long Term Goal 2: Patient B hip flexion MMT 4/5 for tranfsers and bed mobility ease  PT Long Term Goal 2 - Progress: Met Long Term Goal 3: Patient indpendent with HEP for B hip flexibility and strengthening to manage hip OA at discharge  Long Term Goal 3 Progress: Met Long Term Goal 4:  pateint to report mild to no difficulty with basic household work  Long Term Goal 4 Progress: Partly met (patient cannot mop or vacuum without LBP or sit on side of bathtub to bathe grandchild) PT Long Term Goal 5: Patient will be able to ambualte up  flight of stairs with only 1 hand rail assist for balance reporting mild difficulty at most.  Long Term Goal 5 Progress: Not met Additional PT Long Term Goals?: Yes PT Long Term Goal 6: Patient will demosnstrate hip abduction strength Bilaterally of 4+/5 so patient can ambulate > 28mnutes without reporting hip soreness.  Long Term Goal 6 Progress: Partly met  Problem List Patient Active Problem List   Diagnosis Date Noted  . Bilateral hip pain 10/14/2013  . Status post right knee replacement 10/10/2013  . Status post left knee replacement 10/10/2013  . Degenerative arthritis of knee, bilateral 10/10/2013  . Degenerative arthritis of hip 10/10/2013  . S/P carpal tunnel release 05/22/2012  . CTS (carpal tunnel syndrome) 02/29/2012  . S/P total knee replacement 08/10/2011  . TOTAL KNEE FOLLOW-UP 08/04/2010  . IMPINGEMENT SYNDROME 10/11/2007  . KNEE PAIN 05/31/2007  . OSTEOARTHRITIS, LOWER LEG 05/03/2007    PT Plan of Care PT Home Exercise Plan: written HEP provided and scanned into chart.   Consulted and Agree with Plan of Care: Patient  GP Functional Assessment Tool Used: FOTO 47% (was 46%) / 53% (was 54%) limitations  mobility and clinical judgement Functional Limitation: Mobility: Walking and moving around Mobility: Walking and Moving Around Goal Status [phone]: At least 20 percent but less than 40 percent impaired, limited or restricted Mobility: Walking and Moving Around Discharge Status [phone]: At least 40 percent but less than 60 percent impaired, limited or restricted  ATeena Irani PTA/CLT 12/12/2013, 12:06 PM  Raelynne Ludwick PT DPT 12/12/13 12:29pm

## 2013-12-13 NOTE — Telephone Encounter (Signed)
She is on norco 10 and ibuprofen  Suggest bilateral steroid injections (at Hospital) will have to be set up

## 2013-12-17 ENCOUNTER — Ambulatory Visit (HOSPITAL_COMMUNITY): Payer: Medicare Other | Admitting: Physical Therapy

## 2013-12-19 ENCOUNTER — Ambulatory Visit (HOSPITAL_COMMUNITY): Payer: Medicare Other | Admitting: Physical Therapy

## 2013-12-23 NOTE — Telephone Encounter (Signed)
Returned call to patient, and advised that we were working on getting procedure scheduled. She requested to speak with our xray tech, Renee regarding questions about procedure.

## 2013-12-24 ENCOUNTER — Ambulatory Visit (HOSPITAL_COMMUNITY): Payer: Medicare Other | Admitting: Physical Therapy

## 2013-12-24 NOTE — Telephone Encounter (Signed)
I spoke with the patient and explained the procedure to her and she agreed to have it scheduled.

## 2013-12-26 ENCOUNTER — Other Ambulatory Visit: Payer: Self-pay | Admitting: *Deleted

## 2013-12-26 DIAGNOSIS — M25551 Pain in right hip: Secondary | ICD-10-CM

## 2013-12-26 DIAGNOSIS — M25552 Pain in left hip: Principal | ICD-10-CM

## 2013-12-26 NOTE — Telephone Encounter (Signed)
Patient scheduled for bilateral hip injections for 12/30/13 at 10:30 am at [facility].

## 2013-12-26 NOTE — Telephone Encounter (Signed)
Patient was notified of date and time.

## 2013-12-30 ENCOUNTER — Other Ambulatory Visit (HOSPITAL_COMMUNITY): Payer: Medicare Other

## 2013-12-30 ENCOUNTER — Inpatient Hospital Stay (HOSPITAL_COMMUNITY): Admission: RE | Admit: 2013-12-30 | Payer: Medicare Other | Source: Ambulatory Visit

## 2014-01-01 MED ORDER — LIDOCAINE HCL (PF) 1 % IJ SOLN
30.0000 mL | Freq: Once | INTRAMUSCULAR | Status: AC
  Administered 2014-01-02: 30 mL
  Filled 2014-01-01: qty 30

## 2014-01-01 MED ORDER — METHYLPREDNISOLONE ACETATE 40 MG/ML INJ SUSP (RADIOLOG
120.0000 mg | Freq: Once | INTRAMUSCULAR | Status: AC
  Administered 2014-01-02: 120 mg via INTRA_ARTICULAR
  Filled 2014-01-01: qty 3

## 2014-01-02 ENCOUNTER — Ambulatory Visit (HOSPITAL_COMMUNITY)
Admission: RE | Admit: 2014-01-02 | Discharge: 2014-01-02 | Disposition: A | Discharge status: Home / Self Care | Payer: Medicare Other | Source: Ambulatory Visit | Attending: Orthopedic Surgery | Admitting: Orthopedic Surgery

## 2014-01-02 DIAGNOSIS — M25559 Pain in unspecified hip: Secondary | ICD-10-CM | POA: Insufficient documentation

## 2014-01-02 MED ORDER — IOHEXOL 300 MG/ML  SOLN
50.0000 mL | Freq: Once | INTRAMUSCULAR | Status: AC | PRN
  Administered 2014-01-02: 50 mL via INTRAVENOUS

## 2014-01-02 MED ORDER — POVIDONE-IODINE 10 % EX SOLN
CUTANEOUS | Status: AC
  Filled 2014-01-02: qty 30

## 2014-01-02 MED ORDER — POVIDONE-IODINE 10 % EX SOLN
CUTANEOUS | Status: DC | PRN
  Administered 2014-01-02: 11:00:00 via TOPICAL

## 2014-01-02 MED ORDER — POVIDONE-IODINE 10 % EX SOLN
CUTANEOUS | Status: AC
  Filled 2014-01-02: qty 15

## 2014-01-02 MED ORDER — LIDOCAINE HCL (PF) 1 % IJ SOLN
INTRAMUSCULAR | Status: AC
  Filled 2014-01-02: qty 20

## 2014-01-02 NOTE — Procedures (Signed)
Preprocedure Dx: BILATERAL hip pain Postprocedure Dx: BILATERAL hip pain Procedure  Fluoroscopically guided BILATERAL hip joint therapeutic injection  Radiologist:  Tyron Russell Anesthesia:  3 ml 1% lidocaine RIGHT, 3 ml 2% lidocaine LEFT Injectate:  60 mg depomedrol intra-articular ion each hip joint with lidocaine Fluoro time:  2 minutes 6 seconds EBL:   < 1 ml Complications: None

## 2014-04-24 ENCOUNTER — Other Ambulatory Visit: Payer: Self-pay | Admitting: Obstetrics and Gynecology

## 2014-04-24 DIAGNOSIS — Z139 Encounter for screening, unspecified: Secondary | ICD-10-CM

## 2014-04-24 DIAGNOSIS — Z1231 Encounter for screening mammogram for malignant neoplasm of breast: Secondary | ICD-10-CM

## 2014-05-19 ENCOUNTER — Ambulatory Visit (HOSPITAL_COMMUNITY)
Admission: RE | Admit: 2014-05-19 | Discharge: 2014-05-19 | Disposition: A | Discharge status: Home / Self Care | Payer: Medicare Other | Source: Ambulatory Visit | Attending: Obstetrics and Gynecology | Admitting: Obstetrics and Gynecology

## 2014-05-19 DIAGNOSIS — Z1231 Encounter for screening mammogram for malignant neoplasm of breast: Secondary | ICD-10-CM

## 2014-05-26 ENCOUNTER — Encounter: Payer: Self-pay | Admitting: Adult Health

## 2014-10-09 ENCOUNTER — Ambulatory Visit: Payer: Medicare Other | Admitting: Orthopedic Surgery

## 2014-10-14 ENCOUNTER — Ambulatory Visit: Payer: Medicare Other | Admitting: Orthopedic Surgery

## 2014-10-16 ENCOUNTER — Ambulatory Visit: Payer: Medicare Other | Admitting: Orthopedic Surgery

## 2014-10-27 ENCOUNTER — Ambulatory Visit (INDEPENDENT_AMBULATORY_CARE_PROVIDER_SITE_OTHER): Payer: Medicare Other | Admitting: Orthopedic Surgery

## 2014-10-27 ENCOUNTER — Encounter: Payer: Self-pay | Admitting: Orthopedic Surgery

## 2014-10-27 ENCOUNTER — Ambulatory Visit (INDEPENDENT_AMBULATORY_CARE_PROVIDER_SITE_OTHER): Payer: Medicare Other

## 2014-10-27 ENCOUNTER — Ambulatory Visit: Payer: Medicare Other

## 2014-10-27 VITALS — BP 121/79 | Ht 64.5 in | Wt 194.0 lb

## 2014-10-27 DIAGNOSIS — M129 Arthropathy, unspecified: Secondary | ICD-10-CM

## 2014-10-27 DIAGNOSIS — M1710 Unilateral primary osteoarthritis, unspecified knee: Secondary | ICD-10-CM

## 2014-10-27 NOTE — Progress Notes (Signed)
Progress note follow-up visit  Annual knee x-ray right and left  Knee replacements in 2008 (August right in December  Complains of some knee stiffness yesterday which was unusual for her  She also is followed for bilateral hip arthritis left greater than right status post injections which did well last injection was in 2015  Mild left groin pain no radiation, mild stiffness. Right hip asymptomatic. Left knee stiffness as stated. No redness or swelling no giving out catching locking or giving way  Review of systems otherwise normal. No back pain or tenderness no bowel or bladder dysfunction.  Vital signs wrist stable as recorded and reviewed general appearance normal. Both knees looked good feel good excellent range of motion full extension no motor deficits and normal AP and medial lateral stability. Distal neurovascular function intact bilaterally. Skin incisions clean dry and intact. Ambulation without assistive device. Mood and affect normal. Oriented 3. Gen. appearance normal as stated.  Both x-rays look good  I don't think she needs any more x-rays of the knee unless there is an issue  I would like to continue follow-up on her pelvis and hip in 6 months with x-rays there. She can go for an injection in the hip if that flares up again by simple order no appointment needed  Follow-up 6 months for films of the left hip and pelvis

## 2014-11-06 ENCOUNTER — Ambulatory Visit: Payer: Medicare Other | Admitting: Orthopedic Surgery

## 2015-04-30 ENCOUNTER — Ambulatory Visit (INDEPENDENT_AMBULATORY_CARE_PROVIDER_SITE_OTHER): Payer: Medicare Other | Admitting: Orthopedic Surgery

## 2015-04-30 ENCOUNTER — Ambulatory Visit (INDEPENDENT_AMBULATORY_CARE_PROVIDER_SITE_OTHER): Payer: Medicare Other

## 2015-04-30 ENCOUNTER — Encounter: Payer: Self-pay | Admitting: Orthopedic Surgery

## 2015-04-30 VITALS — BP 129/73 | Ht 64.5 in | Wt 185.0 lb

## 2015-04-30 DIAGNOSIS — M5126 Other intervertebral disc displacement, lumbar region: Secondary | ICD-10-CM | POA: Diagnosis not present

## 2015-04-30 DIAGNOSIS — M1990 Unspecified osteoarthritis, unspecified site: Secondary | ICD-10-CM

## 2015-04-30 DIAGNOSIS — M1610 Unilateral primary osteoarthritis, unspecified hip: Secondary | ICD-10-CM

## 2015-04-30 NOTE — Progress Notes (Signed)
Patient ID: [name], female   DOB: February 13, 1951, 64 y.o.   MRN: [id number]  Follow up visit  Chief Complaint  Patient presents with  . Follow-up    6 month follow up + xray left hip/pelvis    BP 129/73 mmHg  Ht 5' 4.5" (1.638 m)  Wt 185 lb (83.915 kg)  BMI 31.28 kg/m2  Encounter Diagnoses  Name Primary?  . Hip arthritis Yes  . Lumbar herniated disc     [name] symptoms include bilateral hip pain giving way symptoms right and left with right more predominant  Review of systems catching locking sensation in the hip primarily on the right. Bowel and bladder function normal  Denies groin pain. Patient has difficulty with activities of daily living such as shopping without holding onto a buggy  Past Medical History  Diagnosis Date  . High cholesterol   . Arthritis   . Anxiety   . Carpal tunnel syndrome on both sides     Vital signs are stable. She is awake alert and oriented 3a device as needed. Mood is pleasant Ambulation without assistive device at this point She has intact straight leg raises with normal hip flexion strength negative logroll bilaterally hip flexion approximately 90. Both hips are stable  Lumbar spine exam shows tenderness in the lumbar spine with painful left and right paraspinous musculature with increased tension. Skin otherwise intact in both leg sensation is normal. Vascularity of both limbs normal as well.   She will need MRI of her spine she is having some signs of spinal stenosis she had a previous lumbar discectomy had an MRI 2007 show multilevel disc disease bilateral L5 root impingement this is treated conservatively with success  Recommend MRI to evaluate for possible epidural injections

## 2015-04-30 NOTE — Patient Instructions (Signed)
We will schedule MRI for you and call you with appt and results 

## 2015-05-12 ENCOUNTER — Other Ambulatory Visit: Payer: Self-pay | Admitting: Obstetrics and Gynecology

## 2015-05-12 DIAGNOSIS — Z1231 Encounter for screening mammogram for malignant neoplasm of breast: Secondary | ICD-10-CM

## 2015-05-22 ENCOUNTER — Ambulatory Visit (HOSPITAL_COMMUNITY): Payer: Medicare Other

## 2015-05-22 ENCOUNTER — Ambulatory Visit (HOSPITAL_COMMUNITY)
Admission: RE | Admit: 2015-05-22 | Discharge: 2015-05-22 | Disposition: A | Discharge status: Home / Self Care | Payer: Medicare Other | Source: Ambulatory Visit | Attending: Obstetrics and Gynecology | Admitting: Obstetrics and Gynecology

## 2015-05-22 ENCOUNTER — Other Ambulatory Visit: Payer: Self-pay | Admitting: Obstetrics and Gynecology

## 2015-05-22 DIAGNOSIS — Z1231 Encounter for screening mammogram for malignant neoplasm of breast: Secondary | ICD-10-CM | POA: Diagnosis present

## 2015-05-26 ENCOUNTER — Ambulatory Visit (HOSPITAL_COMMUNITY)
Admission: RE | Admit: 2015-05-26 | Discharge: 2015-05-26 | Disposition: A | Discharge status: Home / Self Care | Payer: Medicare Other | Source: Ambulatory Visit | Attending: Orthopedic Surgery | Admitting: Orthopedic Surgery

## 2015-05-26 DIAGNOSIS — M5136 Other intervertebral disc degeneration, lumbar region: Secondary | ICD-10-CM | POA: Insufficient documentation

## 2015-05-26 DIAGNOSIS — M5126 Other intervertebral disc displacement, lumbar region: Secondary | ICD-10-CM | POA: Diagnosis not present

## 2015-05-26 DIAGNOSIS — Z9889 Other specified postprocedural states: Secondary | ICD-10-CM | POA: Diagnosis not present

## 2015-05-26 DIAGNOSIS — M47896 Other spondylosis, lumbar region: Secondary | ICD-10-CM | POA: Insufficient documentation

## 2015-05-26 DIAGNOSIS — R531 Weakness: Secondary | ICD-10-CM | POA: Insufficient documentation

## 2015-05-26 DIAGNOSIS — M545 Low back pain: Secondary | ICD-10-CM | POA: Insufficient documentation

## 2015-05-29 ENCOUNTER — Other Ambulatory Visit: Payer: Self-pay | Admitting: *Deleted

## 2015-05-29 ENCOUNTER — Telehealth: Payer: Self-pay | Admitting: *Deleted

## 2015-05-29 DIAGNOSIS — M48061 Spinal stenosis, lumbar region without neurogenic claudication: Secondary | ICD-10-CM

## 2015-05-29 NOTE — Telephone Encounter (Signed)
REFERRAL FAXED TO [facility] FOR [facility]

## 2015-06-01 NOTE — Telephone Encounter (Signed)
APPOINTMENT 06/05/15 10:30, PATIENT AWARE

## 2015-06-02 ENCOUNTER — Telehealth: Payer: Self-pay | Admitting: Orthopedic Surgery

## 2015-06-02 MED ORDER — NORTRIPTYLINE HCL 10 MG PO CAPS: 10.0000 mg | ORAL_CAPSULE | Freq: Every day | ORAL | Status: DC

## 2015-06-02 MED ORDER — HYDROCODONE-ACETAMINOPHEN 5-325 MG PO TABS: 1.0000 | ORAL_TABLET | Freq: Four times a day (QID) | ORAL | Status: DC | PRN

## 2015-06-02 MED ORDER — PREDNISONE 10 MG PO TABS: 10.0000 mg | ORAL_TABLET | Freq: Three times a day (TID) | ORAL | Status: DC

## 2015-06-02 NOTE — Telephone Encounter (Signed)
Patient called to relay that she is having increased pain radiating from back downward, and said that she is needing to use a cane for stability with walking at home, and a walker outside of home. Aware of scheduled appointment for [facility] for 06/05/15, and asking if Dr Romeo AppleHarrison would prescribe something for pain.  States previously had taken Hydrocodone prescribed by primary care, Dr Sudie BaileyKnowlton, although states no longer taking.  Please advise. Her cell# is [phone]

## 2015-06-02 NOTE — Telephone Encounter (Signed)
Prescription available, patient aware  

## 2015-06-05 ENCOUNTER — Ambulatory Visit
Admission: RE | Admit: 2015-06-05 | Discharge: 2015-06-05 | Disposition: A | Discharge status: Home / Self Care | Payer: Medicare Other | Source: Ambulatory Visit | Attending: Orthopedic Surgery | Admitting: Orthopedic Surgery

## 2015-06-05 DIAGNOSIS — M48061 Spinal stenosis, lumbar region without neurogenic claudication: Secondary | ICD-10-CM

## 2015-06-05 MED ORDER — IOHEXOL 180 MG/ML  SOLN
1.0000 mL | Freq: Once | INTRAMUSCULAR | Status: DC | PRN
  Administered 2015-06-05: 1 mL via EPIDURAL

## 2015-06-05 MED ORDER — METHYLPREDNISOLONE ACETATE 40 MG/ML INJ SUSP (RADIOLOG
120.0000 mg | Freq: Once | INTRAMUSCULAR | Status: AC
  Administered 2015-06-05: 120 mg via EPIDURAL

## 2015-06-05 NOTE — Discharge Instructions (Signed)

## 2015-08-03 ENCOUNTER — Encounter: Payer: Self-pay | Admitting: Gastroenterology

## 2015-08-27 ENCOUNTER — Ambulatory Visit (INDEPENDENT_AMBULATORY_CARE_PROVIDER_SITE_OTHER): Payer: Medicare Other | Admitting: Gastroenterology

## 2015-08-27 ENCOUNTER — Encounter: Payer: Self-pay | Admitting: Gastroenterology

## 2015-08-27 ENCOUNTER — Other Ambulatory Visit: Payer: Self-pay

## 2015-08-27 VITALS — BP 149/93 | HR 89 | Temp 97.2°F | Ht 64.0 in | Wt 190.0 lb

## 2015-08-27 DIAGNOSIS — R195 Other fecal abnormalities: Secondary | ICD-10-CM | POA: Diagnosis not present

## 2015-08-27 MED ORDER — NA SULFATE-K SULFATE-MG SULF 17.5-3.13-1.6 GM/177ML PO SOLN: 1.0000 | ORAL | Status: DC

## 2015-08-27 NOTE — Assessment & Plan Note (Signed)
NO BRBPR OR MELENA.FAMILY history of polypS in 3 first degree relatives.  Schedule colonoscopy FOR A Friday in February. PHENERGAN 25 MG IV IN PREOP. DISCUSSED PROCEDURE, BENEFITS, & RISKS: < 1% chance of medication reaction, bleeding, perforation, or rupture of spleen/liver. DRINK WATER TO KEEP YOUR URINE LIGHT YELLOW. FOLLOW A HIGH FIBER DIET. AVOID ITEMS THAT CAUSE BLOATING & GAS. HANDOUT GIVEN. We will determine you need follow up AFTER your ENDOSCOPY.

## 2015-08-27 NOTE — Patient Instructions (Addendum)
Schedule colonoscopy FOR A Friday in February.  DRINK WATER TO KEEP YOUR URINE LIGHT YELLOW.  FOLLOW A HIGH FIBER DIET. AVOID ITEMS THAT CAUSE BLOATING & GAS. SEE INFO BELOW.  We will determine you need follow up AFTER your ENDOSCOPY.  High-Fiber Diet A high-fiber diet changes your normal diet to include more whole grains, legumes, fruits, and vegetables. Changes in the diet involve replacing refined carbohydrates with unrefined foods. The calorie level of the diet is essentially unchanged. The Dietary Reference Intake (recommended amount) for adult males is 38 grams per day. For adult females, it is 25 grams per day. Pregnant and lactating women should consume 28 grams of fiber per day. Fiber is the intact part of a plant that is not broken down during digestion. Functional fiber is fiber that has been isolated from the plant to provide a beneficial effect in the body. PURPOSE  Increase stool bulk.   Ease and regulate bowel movements.   Lower cholesterol.  REDUCE RISK OF COLON CANCER  INDICATIONS THAT YOU NEED MORE FIBER  Constipation and hemorrhoids.   Uncomplicated diverticulosis (intestine condition) and irritable bowel syndrome.   Weight management.   As a protective measure against hardening of the arteries (atherosclerosis), diabetes, and cancer.   GUIDELINES FOR INCREASING FIBER IN THE DIET  Start adding fiber to the diet slowly. A gradual increase of about 5 more grams (2 slices of whole-wheat bread, 2 servings of most fruits or vegetables, or 1 bowl of high-fiber cereal) per day is best. Too rapid an increase in fiber may result in constipation, flatulence, and bloating.   Drink enough water and fluids to keep your urine clear or pale yellow. Water, juice, or caffeine-free drinks are recommended. Not drinking enough fluid may cause constipation.   Eat a variety of high-fiber foods rather than one type of fiber.   Try to increase your intake of fiber through using  high-fiber foods rather than fiber pills or supplements that contain small amounts of fiber.   The goal is to change the types of food eaten. Do not supplement your present diet with high-fiber foods, but replace foods in your present diet.   INCLUDE A VARIETY OF FIBER SOURCES  Replace refined and processed grains with whole grains, canned fruits with fresh fruits, and incorporate other fiber sources. White rice, white breads, and most bakery goods contain little or no fiber.   Brown whole-grain rice, buckwheat oats, and many fruits and vegetables are all good sources of fiber. These include: broccoli, Brussels sprouts, cabbage, cauliflower, beets, sweet potatoes, white potatoes (skin on), carrots, tomatoes, eggplant, squash, berries, fresh fruits, and dried fruits.   Cereals appear to be the richest source of fiber. Cereal fiber is found in whole grains and bran. Bran is the fiber-rich outer coat of cereal grain, which is largely removed in refining. In whole-grain cereals, the bran remains. In breakfast cereals, the largest amount of fiber is found in those with "bran" in their names. The fiber content is sometimes indicated on the label.   You may need to include additional fruits and vegetables each day.   In baking, for 1 cup white flour, you may use the following substitutions:   1 cup whole-wheat flour minus 2 tablespoons.   1/2 cup white flour plus 1/2 cup whole-wheat flour.

## 2015-08-27 NOTE — Progress Notes (Addendum)
Subjective:    Patient ID: [name], female    DOB: June 26, 1951, 65 y.o.   MRN: [id number]  Audrey Obey, MD  HPI Had blood detected in her stool. SHE IS ANXIOUS ABOUT HAVING A TCS. BROTHER HAD POLYPS. NEED S APPT ON FRI. IF GETS OVERWHELMED FEELS LIKE HER BREATHING IS SHALLOW.BMs: QOD WITH STOOL SOFTENER BID. IF NOT SHE'S CONSTIPATED. TAKING PHENTERMINE FOR WEIGHT LOSS 215 LBS TO 185 LBS AND TODAY 190 LBS. APPETITE GOOD. STOPPED PAMELOR SINCE FOREVER.  PT DENIES FEVER, CHILLS, HEMATOCHEZIA, HEMATEMESIS, nausea, vomiting, melena, diarrhea, CHEST PAIN, CHANGE IN BOWEL IN HABITS,  abdominal pain, problems swallowing, problems with sedation, OR heartburn or indigestion.  Past Medical History  Diagnosis Date  . High cholesterol   . Arthritis   . Anxiety   . Carpal tunnel syndrome on both sides    Past Surgical History  Procedure Laterality Date  . Knee surgery    . Laminectomy    . Appendectomy    . Joint replacement  2008    bilateral knees, [facility]; [place]  . Bunionectomy  09/29/2011    Procedure: [name];  Surgeon: Dallas Schimke, DPM;  Location: [facility];  Service: Orthopedics;  Laterality: Right;  [place] Bunionectomy Right Foot  . Carpal tunnel release  04/13/2012    Procedure: CARPAL TUNNEL RELEASE;  Surgeon: Vickki Hearing, MD;  Location: [facility];  Service: Orthopedics;  Laterality: Right;   Allergies  Allergen Reactions  . Gabapentin Rash   Current Outpatient Prescriptions  Medication Sig Dispense Refill  . ALPRAZolam (XANAX) 1 MG tablet Take 1 mg by mouth at bedtime as needed. For sleep EVERY NIGHT   . aspirin EC 81 MG tablet Take 81 mg by mouth daily.    Marland Kitchen docusate sodium (COLACE) 100 MG capsule Take 100 mg by mouth 2 (two) times daily.    . MULTIVITAMIN) capsule Take 1 capsule by mouth daily.    . phentermine 37.5 MG capsule Take 37.5 mg by mouth every morning.    . simvastatin (ZOCOR) 40 MG tablet Take 40 mg by mouth every evening.    Awanda Mink)  5-325 MG tablet Take 1 tablet PO every 6H PRN for moderate pain. (Patient not taking: Reported on 08/27/2015)    . iADVIL,MOTRIN) 200 MG tablet Take 400 mg by mouth every 6 H PRN RARE   .      Marland Kitchen       Family History  Problem Relation Age of Onset  . Anesthesia problems Neg Hx   . Hypotension Neg Hx   . Malignant hyperthermia Neg Hx   . Pseudochol deficiency Neg Hx   . Dementia Mother   . Arthritis Mother   . Hypertension Mother   . Colon polyps Mother   . Cancer Father     prostate, lung  . COPD Father   . Heart disease Father   . Colon polyps Father   . Hyperlipidemia Sister   . Colon polyps Sister   . Hyperlipidemia Brother   . Colon polyps Brother   . Birth defects Son   . Early death Son   . Congestive Heart Failure Paternal Aunt   . Cancer Maternal Grandmother     lung  . Diabetes Maternal Grandfather   . Heart attack Maternal Grandfather   . Heart disease Paternal Grandmother   . Stroke Paternal Grandmother   . Congestive Heart Failure Paternal Grandmother   . Cancer Paternal Grandfather     lung  .  Cancer Brother     liver  . Diabetes Brother   . Irritable bowel syndrome Daughter   . Congestive Heart Failure Paternal Aunt     Social History   Social History  . Marital Status: Married    Spouse Name: N/A  . Number of Children: N/A  . Years of Education: N/A   Occupational History  . Not on file.   Social History Main Topics  . Smoking status: Former Smoker    Quit date: 11/26/2010  . Smokeless tobacco: Never Used  . Alcohol Use: Yes     Comment: occasional wine  . Drug Use: No  . Sexual Activity: Not Currently    Birth Control/ Protection: Post-menopausal   Other Topics Concern  . Not on file   Social History Narrative   RAISED KID. THEN WORKED FOR 12 YRS. TROUBLE WITH HIPS AND KNEES TOOK HER OUT OF WORK DAUGHTER WORKS FOR [employer] HARR[employer]. ANOTHER DAUGHTER STUDYING TO BE A NURSE.    Review of Systems PER HPI OTHERWISE ALL SYSTEMS ARE  NEGATIVE.    Objective:   Physical Exam  Constitutional: She is oriented to person, place, and time. She appears well-developed and well-nourished. No distress.  HENT:  Head: Normocephalic and atraumatic.  Mouth/Throat: Oropharynx is clear and moist. No oropharyngeal exudate.  Eyes: Pupils are equal, round, and reactive to light. No scleral icterus.  Neck: Normal range of motion. Neck supple.  Cardiovascular: Normal rate, regular rhythm and normal heart sounds.   Pulmonary/Chest: Effort normal and breath sounds normal. No respiratory distress.  Abdominal: Soft. Bowel sounds are normal. She exhibits no distension. There is no tenderness.  Musculoskeletal: She exhibits no edema.  Lymphadenopathy:    She has no cervical adenopathy.  Neurological: She is alert and oriented to person, place, and time.  NO FOCAL DEFICITS  Psychiatric: She has a normal mood and affect.  Vitals reviewed.         Assessment & Plan:

## 2015-08-28 NOTE — Progress Notes (Signed)
CC'ED TO PCP 

## 2015-09-11 ENCOUNTER — Encounter (HOSPITAL_COMMUNITY): Payer: Self-pay | Admitting: *Deleted

## 2015-09-11 ENCOUNTER — Ambulatory Visit (HOSPITAL_COMMUNITY)
Admission: RE | Admit: 2015-09-11 | Discharge: 2015-09-11 | Disposition: A | Discharge status: Home / Self Care | Payer: Medicare Other | Source: Ambulatory Visit | Attending: Gastroenterology | Admitting: Gastroenterology

## 2015-09-11 ENCOUNTER — Encounter (HOSPITAL_COMMUNITY)
Admission: RE | Disposition: A | Discharge status: Home / Self Care | Payer: Self-pay | Source: Ambulatory Visit | Attending: Gastroenterology

## 2015-09-11 DIAGNOSIS — Q438 Other specified congenital malformations of intestine: Secondary | ICD-10-CM | POA: Insufficient documentation

## 2015-09-11 DIAGNOSIS — Z801 Family history of malignant neoplasm of trachea, bronchus and lung: Secondary | ICD-10-CM | POA: Diagnosis not present

## 2015-09-11 DIAGNOSIS — E7800 Pure hypercholesterolemia, unspecified: Secondary | ICD-10-CM | POA: Diagnosis not present

## 2015-09-11 DIAGNOSIS — K649 Unspecified hemorrhoids: Secondary | ICD-10-CM | POA: Diagnosis not present

## 2015-09-11 DIAGNOSIS — Z87891 Personal history of nicotine dependence: Secondary | ICD-10-CM | POA: Diagnosis not present

## 2015-09-11 DIAGNOSIS — K5730 Diverticulosis of large intestine without perforation or abscess without bleeding: Secondary | ICD-10-CM | POA: Insufficient documentation

## 2015-09-11 DIAGNOSIS — M1991 Primary osteoarthritis, unspecified site: Secondary | ICD-10-CM | POA: Diagnosis not present

## 2015-09-11 DIAGNOSIS — Z808 Family history of malignant neoplasm of other organs or systems: Secondary | ICD-10-CM | POA: Insufficient documentation

## 2015-09-11 DIAGNOSIS — K644 Residual hemorrhoidal skin tags: Secondary | ICD-10-CM | POA: Insufficient documentation

## 2015-09-11 DIAGNOSIS — K921 Melena: Secondary | ICD-10-CM | POA: Diagnosis present

## 2015-09-11 DIAGNOSIS — R195 Other fecal abnormalities: Secondary | ICD-10-CM | POA: Diagnosis not present

## 2015-09-11 DIAGNOSIS — F419 Anxiety disorder, unspecified: Secondary | ICD-10-CM | POA: Insufficient documentation

## 2015-09-11 DIAGNOSIS — Z7982 Long term (current) use of aspirin: Secondary | ICD-10-CM | POA: Diagnosis not present

## 2015-09-11 DIAGNOSIS — Z79899 Other long term (current) drug therapy: Secondary | ICD-10-CM | POA: Insufficient documentation

## 2015-09-11 DIAGNOSIS — Z8371 Family history of colonic polyps: Secondary | ICD-10-CM | POA: Insufficient documentation

## 2015-09-11 DIAGNOSIS — Z8042 Family history of malignant neoplasm of prostate: Secondary | ICD-10-CM | POA: Diagnosis not present

## 2015-09-11 HISTORY — PX: COLONOSCOPY: SHX5424

## 2015-09-11 SURGERY — COLONOSCOPY
Anesthesia: Moderate Sedation

## 2015-09-11 MED ORDER — PROMETHAZINE HCL 25 MG/ML IJ SOLN
25.0000 mg | Freq: Once | INTRAMUSCULAR | Status: AC
  Administered 2015-09-11: 25 mg via INTRAVENOUS

## 2015-09-11 MED ORDER — PROMETHAZINE HCL 25 MG/ML IJ SOLN
INTRAMUSCULAR | Status: AC
  Filled 2015-09-11: qty 1

## 2015-09-11 MED ORDER — SODIUM CHLORIDE 0.9% FLUSH
INTRAVENOUS | Status: AC
  Filled 2015-09-11: qty 10

## 2015-09-11 MED ORDER — SODIUM CHLORIDE 0.9 % IV SOLN
INTRAVENOUS | Status: DC
  Administered 2015-09-11: 1000 mL via INTRAVENOUS

## 2015-09-11 MED ORDER — MIDAZOLAM HCL 5 MG/5ML IJ SOLN
INTRAMUSCULAR | Status: DC | PRN
  Administered 2015-09-11: 1 mg via INTRAVENOUS
  Administered 2015-09-11: 2 mg via INTRAVENOUS
  Administered 2015-09-11: 1 mg via INTRAVENOUS

## 2015-09-11 MED ORDER — MEPERIDINE HCL 100 MG/ML IJ SOLN
INTRAMUSCULAR | Status: AC
  Filled 2015-09-11: qty 2

## 2015-09-11 MED ORDER — MEPERIDINE HCL 100 MG/ML IJ SOLN
INTRAMUSCULAR | Status: DC | PRN
  Administered 2015-09-11: 25 mg via INTRAVENOUS
  Administered 2015-09-11: 50 mg via INTRAVENOUS

## 2015-09-11 MED ORDER — SIMETHICONE 40 MG/0.6ML PO SUSP
ORAL | Status: DC | PRN
  Administered 2015-09-11: 11:00:00

## 2015-09-11 MED ORDER — MIDAZOLAM HCL 5 MG/5ML IJ SOLN
INTRAMUSCULAR | Status: AC
  Filled 2015-09-11: qty 10

## 2015-09-11 NOTE — Discharge Instructions (Signed)
YOUR RECTAL BLEEDING IS DUE TO INTERNAL HEMORRHOIDS. You have small internal hemorrhoids and LARGE EXTERNAL HEMORRHOIDS and diverticulosis IN YOUR LEFT COLON.   DRINK WATER TO KEEP YOUR URINE LIGHT YELLOW.  Follow a HIGH FIBER DIET. AVOID ITEMS THAT CAUSE BLOATING. See info below.  USE PREPARATION H FOUR TIMES  A DAY AS NEEDED TO RELIEVE RECTAL PAIN/PRESSURE/BLEEDING.  Next colonoscopy in 5-10 years.  Colonoscopy Care After Read the instructions outlined below and refer to this sheet in the next week. These discharge instructions provide you with general information on caring for yourself after you leave the hospital. While your treatment has been planned according to the most current medical practices available, unavoidable complications occasionally occur. If you have any problems or questions after discharge, call DR. Barnell Shieh, [phone].  ACTIVITY  You may resume your regular activity, but move at a slower pace for the next 24 hours.   Take frequent rest periods for the next 24 hours.   Walking will help get rid of the air and reduce the bloated feeling in your belly (abdomen).   No driving for 24 hours (because of the medicine (anesthesia) used during the test).   You may shower.   Do not sign any important legal documents or operate any machinery for 24 hours (because of the anesthesia used during the test).    NUTRITION  Drink plenty of fluids.   You may resume your normal diet as instructed by your doctor.   Begin with a light meal and progress to your normal diet. Heavy or fried foods are harder to digest and may make you feel sick to your stomach (nauseated).   Avoid alcoholic beverages for 24 hours or as instructed.    MEDICATIONS  You may resume your normal medications.   WHAT YOU CAN EXPECT TODAY  Some feelings of bloating in the abdomen.   Passage of more gas than usual.   Spotting of blood in your stool or on the toilet paper  .  IF YOU HAD  POLYPS REMOVED DURING THE COLONOSCOPY:  Eat a soft diet IF YOU HAVE NAUSEA, BLOATING, ABDOMINAL PAIN, OR VOMITING.    FINDING OUT THE RESULTS OF YOUR TEST Not all test results are available during your visit. DR. Darrick Penna WILL CALL YOU WITHIN 14 DAYS OF YOUR PROCEDUE WITH YOUR RESULTS. Do not assume everything is normal if you have not heard from DR. Taron Conrey, CALL HER OFFICE AT [phone].  SEEK IMMEDIATE MEDICAL ATTENTION AND CALL THE OFFICE: [phone] IF:  You have more than a spotting of blood in your stool.   Your belly is swollen (abdominal distention).   You are nauseated or vomiting.   You have a temperature over 101F.   You have abdominal pain or discomfort that is severe or gets worse throughout the day.  High-Fiber Diet A high-fiber diet changes your normal diet to include more whole grains, legumes, fruits, and vegetables. Changes in the diet involve replacing refined carbohydrates with unrefined foods. The calorie level of the diet is essentially unchanged. The Dietary Reference Intake (recommended amount) for adult males is 38 grams per day. For adult females, it is 25 grams per day. Pregnant and lactating women should consume 28 grams of fiber per day. Fiber is the intact part of a plant that is not broken down during digestion. Functional fiber is fiber that has been isolated from the plant to provide a beneficial effect in the body. PURPOSE  Increase stool bulk.   Ease and regulate bowel  movements.   Lower cholesterol.  REDUCE RISK OF COLON CANCER  INDICATIONS THAT YOU NEED MORE FIBER  Constipation and hemorrhoids.   Uncomplicated diverticulosis (intestine condition) and irritable bowel syndrome.   Weight management.   As a protective measure against hardening of the arteries (atherosclerosis), diabetes, and cancer.   GUIDELINES FOR INCREASING FIBER IN THE DIET  Start adding fiber to the diet slowly. A gradual increase of about 5 more grams (2 slices of  whole-wheat bread, 2 servings of most fruits or vegetables, or 1 bowl of high-fiber cereal) per day is best. Too rapid an increase in fiber may result in constipation, flatulence, and bloating.   Drink enough water and fluids to keep your urine clear or pale yellow. Water, juice, or caffeine-free drinks are recommended. Not drinking enough fluid may cause constipation.   Eat a variety of high-fiber foods rather than one type of fiber.   Try to increase your intake of fiber through using high-fiber foods rather than fiber pills or supplements that contain small amounts of fiber.   The goal is to change the types of food eaten. Do not supplement your present diet with high-fiber foods, but replace foods in your present diet.   INCLUDE A VARIETY OF FIBER SOURCES  Replace refined and processed grains with whole grains, canned fruits with fresh fruits, and incorporate other fiber sources. White rice, white breads, and most bakery goods contain little or no fiber.   Brown whole-grain rice, buckwheat oats, and many fruits and vegetables are all good sources of fiber. These include: broccoli, Brussels sprouts, cabbage, cauliflower, beets, sweet potatoes, white potatoes (skin on), carrots, tomatoes, eggplant, squash, berries, fresh fruits, and dried fruits.   Cereals appear to be the richest source of fiber. Cereal fiber is found in whole grains and bran. Bran is the fiber-rich outer coat of cereal grain, which is largely removed in refining. In whole-grain cereals, the bran remains. In breakfast cereals, the largest amount of fiber is found in those with "bran" in their names. The fiber content is sometimes indicated on the label.   You may need to include additional fruits and vegetables each day.   In baking, for 1 cup white flour, you may use the following substitutions:   1 cup whole-wheat flour minus 2 tablespoons.   1/2 cup white flour plus 1/2 cup whole-wheat flour.    Diverticulosis Diverticulosis is a common condition that develops when small pouches (diverticula) form in the wall of the colon. The risk of diverticulosis increases with age. It happens more often in people who eat a low-fiber diet. Most individuals with diverticulosis have no symptoms. Those individuals with symptoms usually experience belly (abdominal) pain, constipation, or loose stools (diarrhea).  HOME CARE INSTRUCTIONS  Increase the amount of fiber in your diet as directed by your caregiver or dietician. This may reduce symptoms of diverticulosis.   Drink at least 6 to 8 glasses of water each day to prevent constipation.   Try not to strain when you have a bowel movement.   Avoiding nuts and seeds to prevent complications is NOT NECESSARY.   FOODS HAVING HIGH FIBER CONTENT INCLUDE:  Fruits. Apple, peach, pear, tangerine, raisins, prunes.   Vegetables. Brussels sprouts, asparagus, broccoli, cabbage, carrot, cauliflower, romaine lettuce, spinach, summer squash, tomato, winter squash, zucchini.   Starchy Vegetables. Baked beans, kidney beans, lima beans, split peas, lentils, potatoes (with skin).   Grains. Whole wheat bread, brown rice, bran flake cereal, plain oatmeal, white rice, shredded wheat,  bran muffins.   SEEK IMMEDIATE MEDICAL CARE IF:  You develop increasing pain or severe bloating.   You have an oral temperature above 101F.   You develop vomiting or bowel movements that are bloody or black.   Hemorrhoids Hemorrhoids are dilated (enlarged) veins around the rectum. Sometimes clots will form in the veins. This makes them swollen and painful. These are called thrombosed hemorrhoids. Causes of hemorrhoids include:  Constipation.   Straining to have a bowel movement.   HEAVY LIFTING  HOME CARE INSTRUCTIONS  Eat a well balanced diet and drink 6 to 8 glasses of water every day to avoid constipation. You may also use a bulk laxative.   Avoid straining to have  bowel movements.   Keep anal area dry and clean.   Do not use a donut shaped pillow or sit on the toilet for long periods. This increases blood pooling and pain.   Move your bowels when your body has the urge; this will require less straining and will decrease pain and pressure.

## 2015-09-11 NOTE — H&P (View-Only) (Signed)
Subjective:    Patient ID: [name], female    DOB: [birth year], 65 y.o.   MRN: [id number]  [name], MD  HPI Had blood detected in her stool. SHE IS ANXIOUS ABOUT HAVING A TCS. BROTHER HAD POLYPS. NEED S APPT ON FRI. IF GETS OVERWHELMED FEELS LIKE HER BREATHING IS SHALLOW.BMs: QOD WITH STOOL SOFTENER BID. IF NOT SHE'S CONSTIPATED. TAKING PHENTERMINE FOR WEIGHT LOSS 215 LBS TO 185 LBS AND TODAY 190 LBS. APPETITE GOOD. STOPPED PAMELOR SINCE FOREVER.  PT DENIES FEVER, CHILLS, HEMATOCHEZIA, HEMATEMESIS, nausea, vomiting, melena, diarrhea, CHEST PAIN, CHANGE IN BOWEL IN HABITS,  abdominal pain, problems swallowing, problems with sedation, OR heartburn or indigestion.  Past Medical History  Diagnosis Date  . High cholesterol   . Arthritis   . Anxiety   . Carpal tunnel syndrome on both sides    Past Surgical History  Procedure Laterality Date  . Knee surgery    . Laminectomy    . Appendectomy    . Joint replacement  2008    bilateral knees, [facility]; [place]  . Bunionectomy  09/29/2011    Procedure: [name];  Surgeon: Dallas Schimke, DPM;  Location: [facility];  Service: Orthopedics;  Laterality: Right;  [place] Bunionectomy Right Foot  . Carpal tunnel release  04/13/2012    Procedure: CARPAL TUNNEL RELEASE;  Surgeon: Vickki Hearing, MD;  Location: [facility];  Service: Orthopedics;  Laterality: Right;   Allergies  Allergen Reactions  . Gabapentin Rash   Current Outpatient Prescriptions  Medication Sig Dispense Refill  . ALPRAZolam (XANAX) 1 MG tablet Take 1 mg by mouth at bedtime as needed. For sleep EVERY NIGHT   . aspirin EC 81 MG tablet Take 81 mg by mouth daily.    Marland Kitchen docusate sodium (COLACE) 100 MG capsule Take 100 mg by mouth 2 (two) times daily.    . MULTIVITAMIN) capsule Take 1 capsule by mouth daily.    . phentermine 37.5 MG capsule Take 37.5 mg by mouth every morning.    . simvastatin (ZOCOR) 40 MG tablet Take 40 mg by mouth every evening.    Awanda Mink)  5-325 MG tablet Take 1 tablet PO every 6H PRN for moderate pain. (Patient not taking: Reported on 08/27/2015)    . iADVIL,MOTRIN) 200 MG tablet Take 400 mg by mouth every 6 H PRN RARE   .      Marland Kitchen       Family History  Problem Relation Age of Onset  . Anesthesia problems Neg Hx   . Hypotension Neg Hx   . Malignant hyperthermia Neg Hx   . Pseudochol deficiency Neg Hx   . Dementia Mother   . Arthritis Mother   . Hypertension Mother   . Colon polyps Mother   . Cancer Father     prostate, lung  . COPD Father   . Heart disease Father   . Colon polyps Father   . Hyperlipidemia Sister   . Colon polyps Sister   . Hyperlipidemia Brother   . Colon polyps Brother   . Birth defects Son   . Early death Son   . Congestive Heart Failure Paternal Aunt   . Cancer Maternal Grandmother     lung  . Diabetes Maternal Grandfather   . Heart attack Maternal Grandfather   . Heart disease Paternal Grandmother   . Stroke Paternal Grandmother   . Congestive Heart Failure Paternal Grandmother   . Cancer Paternal Grandfather     lung  .  Cancer Brother     liver  . Diabetes Brother   . Irritable bowel syndrome Daughter   . Congestive Heart Failure Paternal Aunt     Social History   Social History  . Marital Status: Married    Spouse Name: N/A  . Number of Children: N/A  . Years of Education: N/A   Occupational History  . Not on file.   Social History Main Topics  . Smoking status: Former Smoker    Quit date: 11/26/2010  . Smokeless tobacco: Never Used  . Alcohol Use: Yes     Comment: occasional wine  . Drug Use: No  . Sexual Activity: Not Currently    Birth Control/ Protection: Post-menopausal   Other Topics Concern  . Not on file   Social History Narrative   RAISED KID. THEN WORKED FOR 12 YRS. TROUBLE WITH HIPS AND KNEES TOOK HER OUT OF WORK DAUGHTER WORKS FOR [employer]. ANOTHER DAUGHTER STUDYING TO BE A NURSE.    Review of Systems PER HPI OTHERWISE ALL SYSTEMS ARE  NEGATIVE.    Objective:   Physical Exam  Constitutional: She is oriented to person, place, and time. She appears well-developed and well-nourished. No distress.  HENT:  Head: Normocephalic and atraumatic.  Mouth/Throat: Oropharynx is clear and moist. No oropharyngeal exudate.  Eyes: Pupils are equal, round, and reactive to light. No scleral icterus.  Neck: Normal range of motion. Neck supple.  Cardiovascular: Normal rate, regular rhythm and normal heart sounds.   Pulmonary/Chest: Effort normal and breath sounds normal. No respiratory distress.  Abdominal: Soft. Bowel sounds are normal. She exhibits no distension. There is no tenderness.  Musculoskeletal: She exhibits no edema.  Lymphadenopathy:    She has no cervical adenopathy.  Neurological: She is alert and oriented to person, place, and time.  NO FOCAL DEFICITS  Psychiatric: She has a normal mood and affect.  Vitals reviewed.         Assessment & Plan:

## 2015-09-11 NOTE — Op Note (Addendum)
[facility] [address] [place], [address]   COLONOSCOPY PROCEDURE REPORT  PATIENT: [name], [name]  MR#: [id number] BIRTHDATE: 03-12-1951 , 64  yrs. old GENDER: female ENDOSCOPIST: West Bali, MD REFERRED XB:JYNWG Sudie Bailey, M.D. PROCEDURE DATE:  09/11/2015 PROCEDURE:   Colonoscopy, diagnostic INDICATIONS:hematochezia.  family histoyof first degree relatives WITH COLON POLYPS. MEDICATIONS: Demerol 75 mg IV, Versed 4 mg IV, and Promethazine (Phenergan) 25 mg IV  MD STARTED SEDATION: 1033 PROCEDURE COMPLETE: 1107  DESCRIPTION OF PROCEDURE:    Physical exam was performed.  Informed consent was obtained from the patient after explaining the benefits, risks, and alternatives to procedure.  The patient was connected to monitor and placed in left lateral position. Continuous oxygen was provided by nasal cannula and IV medicine administered through an indwelling cannula.  After administration of sedation and rectal exam, the patients rectum was intubated and the EC-[id number] ([id number])  colonoscope was advanced under direct visualization to the ileum.  The scope was removed slowly by carefully examining the color, texture, anatomy, and integrity mucosa on the way out.  The patient was recovered in endoscopy and discharged home in satisfactory condition. Estimated blood loss is zero unless otherwise noted in this procedure report.    COLON FINDINGS: The examined terminal ileum appeared to be normal. , There was moderate diverticulosis noted throughout the entire examined colon with associated muscular hypertrophy and tortuosity. , The colon was redundant.  Manual abdominal counter-pressure was used to reach the cecum, Small internal hemorrhoids were found.  , and Moderate sized external hemorrhoids were found.  PREP QUALITY: excellent. CECAL W/D TIME: 17       minutes  COMPLICATIONS: None  ENDOSCOPIC IMPRESSION: 1.   RECTAL BLEEDING DUE TO Small internal  hemorrhoids 2.   Moderate diverticulosis throughout the entire examined colon 3.   The LEFT colon IS  redundant 4.   Moderate sized external hemorrhoids  RECOMMENDATIONS: DRINK WATER TO KEEP URINE LIGHT YELLOW. Follow a HIGH FIBER DIET. USE PREPARATION H FOUR TIMES A DAY AS NEEDED TO RELIEVE RECTAL PAIN/PRESSURE/BLEEDING. Next colonoscopy in 5-10 years.   eSigned:  West Bali, MD 10-13-15 8:45 PM Revised: 2015-10-13 8:45 PM  CPT CODES: ICD CODES:  The ICD and CPT codes recommended by this software are interpretations from the data that the clinical staff has captured with the software.  The verification of the translation of this report to the ICD and CPT codes and modifiers is the sole responsibility of the health care institution and practicing physician where this report was generated.  [employer] will not be held responsible for the validity of the ICD and CPT codes included on this report.  AMA assumes no liability for data contained or not contained herein. CPT is a Publishing rights manager of the [employer].

## 2015-09-11 NOTE — Interval H&P Note (Signed)
History and Physical Interval Note:  09/11/2015 10:22 AM  [name]  has presented today for surgery, with the diagnosis of HEME STOOLS  The various methods of treatment have been discussed with the patient and family. After consideration of risks, benefits and other options for treatment, the patient has consented to  Procedure(s) with comments: COLONOSCOPY (N/A) - 1000 as a surgical intervention .  The patient's history has been reviewed, patient examined, no change in status, stable for surgery.  I have reviewed the patient's chart and labs.  Questions were answered to the patient's satisfaction.     [employer]

## 2015-09-15 ENCOUNTER — Encounter (HOSPITAL_COMMUNITY): Payer: Self-pay | Admitting: Gastroenterology

## 2015-11-07 ENCOUNTER — Emergency Department (HOSPITAL_COMMUNITY): Payer: Medicare Other

## 2015-11-07 ENCOUNTER — Emergency Department (HOSPITAL_COMMUNITY)
Admission: EM | Admit: 2015-11-07 | Discharge: 2015-11-07 | Disposition: A | Discharge status: Home / Self Care | Payer: Medicare Other | Attending: Emergency Medicine | Admitting: Emergency Medicine

## 2015-11-07 ENCOUNTER — Encounter (HOSPITAL_COMMUNITY): Payer: Self-pay | Admitting: Emergency Medicine

## 2015-11-07 DIAGNOSIS — Z791 Long term (current) use of non-steroidal anti-inflammatories (NSAID): Secondary | ICD-10-CM | POA: Insufficient documentation

## 2015-11-07 DIAGNOSIS — Z7982 Long term (current) use of aspirin: Secondary | ICD-10-CM | POA: Diagnosis not present

## 2015-11-07 DIAGNOSIS — Z79891 Long term (current) use of opiate analgesic: Secondary | ICD-10-CM | POA: Insufficient documentation

## 2015-11-07 DIAGNOSIS — Z96653 Presence of artificial knee joint, bilateral: Secondary | ICD-10-CM | POA: Insufficient documentation

## 2015-11-07 DIAGNOSIS — M25561 Pain in right knee: Secondary | ICD-10-CM | POA: Diagnosis present

## 2015-11-07 DIAGNOSIS — Z87891 Personal history of nicotine dependence: Secondary | ICD-10-CM | POA: Diagnosis not present

## 2015-11-07 DIAGNOSIS — E7800 Pure hypercholesterolemia, unspecified: Secondary | ICD-10-CM | POA: Diagnosis not present

## 2015-11-07 HISTORY — DX: Presence of unspecified artificial knee joint: Z96.659

## 2015-11-07 LAB — CBC WITH DIFFERENTIAL/PLATELET
BASOS ABS: 0 10*3/uL (ref 0.0–0.1)
BASOS PCT: 0 %
EOS PCT: 1 %
Eosinophils Absolute: 0.1 10*3/uL (ref 0.0–0.7)
HEMATOCRIT: 34.9 % — AB (ref 36.0–46.0)
HEMOGLOBIN: 11.6 g/dL — AB (ref 12.0–15.0)
Lymphocytes Relative: 23 %
Lymphs Abs: 1.7 10*3/uL (ref 0.7–4.0)
MCH: 30.4 pg (ref 26.0–34.0)
MCHC: 33.2 g/dL (ref 30.0–36.0)
MCV: 91.6 fL (ref 78.0–100.0)
MONO ABS: 0.6 10*3/uL (ref 0.1–1.0)
MONOS PCT: 8 %
NEUTROS ABS: 5.1 10*3/uL (ref 1.7–7.7)
Neutrophils Relative %: 68 %
Platelets: 207 10*3/uL (ref 150–400)
RBC: 3.81 MIL/uL — ABNORMAL LOW (ref 3.87–5.11)
RDW: 13.4 % (ref 11.5–15.5)
WBC: 7.4 10*3/uL (ref 4.0–10.5)

## 2015-11-07 LAB — BASIC METABOLIC PANEL
ANION GAP: 7 (ref 5–15)
BUN: 12 mg/dL (ref 6–20)
CALCIUM: 8.9 mg/dL (ref 8.9–10.3)
CO2: 26 mmol/L (ref 22–32)
Chloride: 106 mmol/L (ref 101–111)
Creatinine, Ser: 0.68 mg/dL (ref 0.44–1.00)
GFR calc Af Amer: >60 mL/min (ref >60)
GLUCOSE: 126 mg/dL — AB (ref 65–99)
Potassium: 3.9 mmol/L (ref 3.5–5.1)
Sodium: 139 mmol/L (ref 135–145)

## 2015-11-07 MED ORDER — DICLOFENAC SODIUM 3 % TD GEL: TRANSDERMAL | Status: DC

## 2015-11-07 MED ORDER — HYDROCODONE-ACETAMINOPHEN 5-325 MG PO TABS
1.0000 | ORAL_TABLET | Freq: Once | ORAL | Status: AC
  Administered 2015-11-07: 1 via ORAL
  Filled 2015-11-07: qty 1

## 2015-11-07 MED ORDER — DEXAMETHASONE SODIUM PHOSPHATE 4 MG/ML IJ SOLN
8.0000 mg | Freq: Once | INTRAMUSCULAR | Status: AC
  Administered 2015-11-07: 8 mg via INTRAMUSCULAR
  Filled 2015-11-07: qty 2

## 2015-11-07 MED ORDER — ONDANSETRON HCL 4 MG PO TABS
4.0000 mg | ORAL_TABLET | Freq: Once | ORAL | Status: AC
  Administered 2015-11-07: 4 mg via ORAL
  Filled 2015-11-07: qty 1

## 2015-11-07 MED ORDER — HYDROCODONE-ACETAMINOPHEN 5-325 MG PO TABS
1.0000 | ORAL_TABLET | ORAL | Status: DC | PRN
Start: 2015-11-07 — End: 2016-01-13

## 2015-11-07 NOTE — ED Notes (Signed)
PT c/o right knee pain and swelling with no injury recently. PT states knee replacement to that knee in 2008. PT denies any pain behind her knee or down the calf.

## 2015-11-07 NOTE — Discharge Instructions (Signed)
Your complete blood count is within normal limits. Doubt any infected joint problem. Your electrolytes are within normal limits, doubt electrolyte related muscle or joint problem. The x-ray of your knee reveals the hardware to be in place, and no evidence of loose hardware or any stress fracture. Suspect that you have inflammation related to your previous knee procedures. Please apply diclofenac gel to or 3 times daily. Please use Tylenol for mild pain, use Norco for more severe pain. Please see Dr. Romeo AppleHarrison in the office next week for orthopedic evaluation and management of your knee pain and swelling. Please use the knee immobilizer and your walker until seen by Dr. Romeo AppleHarrison. You do not need to sleep in the knee immobilizer device.

## 2015-11-09 NOTE — ED Provider Notes (Signed)
CSN: [id number]     Arrival date & time 11/07/15  1152 History   First MD Initiated Contact with Patient 11/07/15 1232     Chief Complaint  Patient presents with  . Knee Pain     (Consider location/radiation/quality/duration/timing/severity/associated sxs/prior Treatment) Patient is a 65 y.o. female presenting with knee pain. The history is provided by the patient.  Knee Pain Location:  Knee Time since incident:  3 days (no injury. swelling and discomfort worse for 3 days) Injury: no   Knee location:  R knee Pain details:    Quality:  Aching and shooting   Severity:  Moderate   Onset quality:  Gradual   Duration:  3 days   Timing:  Intermittent   Progression:  Worsening Chronicity: acute on chronic. Dislocation: no   Relieved by:  Nothing Worsened by:  Bearing weight, extension and flexion Ineffective treatments:  Acetaminophen Associated symptoms: decreased ROM, stiffness and swelling   Associated symptoms: no back pain, no fever and no neck pain   Risk factors comment:  Knee replacement surg in 2008   Past Medical History  Diagnosis Date  . High cholesterol   . Arthritis   . Anxiety   . Carpal tunnel syndrome on both sides   . Knee joint replacement status    Past Surgical History  Procedure Laterality Date  . Knee surgery    . Laminectomy    . Appendectomy    . Joint replacement  2008    bilateral knees, [facility]; [facility]  . Bunionectomy  09/29/2011    RIGHT  . Carpal tunnel release  04/13/2012    rocedure: CARPAL TUNNEL RELEASE;  Surgeon: Carole Civil, MD;  Location: [facility];  Service: Orthopedics;  Laterality: Right;  . Colonoscopy N/A 09/11/2015    Procedure: COLONOSCOPY;  Surgeon: Danie Binder, MD;  Location: [facility];  Service: Endoscopy;  Laterality: N/A;  1000  . Breast cyst excision     Family History  Problem Relation Age of Onset  . Anesthesia problems Neg Hx   . Hypotension Neg Hx   . Malignant hyperthermia Neg Hx   . Pseudochol  deficiency Neg Hx   . Dementia Mother   . Arthritis Mother   . Hypertension Mother   . Colon polyps Mother   . Cancer Father     prostate, lung  . COPD Father   . Heart disease Father   . Colon polyps Father   . Hyperlipidemia Sister   . Colon polyps Sister   . Hyperlipidemia Brother   . Colon polyps Brother   . Birth defects Son   . Early death Son   . Congestive Heart Failure Paternal Aunt   . Cancer Maternal Grandmother     lung  . Diabetes Maternal Grandfather   . Heart attack Maternal Grandfather   . Heart disease Paternal Grandmother   . Stroke Paternal Grandmother   . Congestive Heart Failure Paternal Grandmother   . Cancer Paternal Grandfather     lung  . Cancer Brother     liver  . Diabetes Brother   . Irritable bowel syndrome Daughter   . Congestive Heart Failure Paternal Aunt    Social History  Substance Use Topics  . Smoking status: Former Smoker    Quit date: 11/26/2010  . Smokeless tobacco: Never Used  . Alcohol Use: Yes     Comment: occasional wine   OB History    Gravida Para Term Preterm AB TAB SAB Ectopic Multiple  Living   '4 4        3     ' Review of Systems  Constitutional: Negative for fever and activity change.       All ROS Neg except as noted in HPI  HENT: Negative for nosebleeds.   Eyes: Negative for photophobia and discharge.  Respiratory: Negative for cough, shortness of breath and wheezing.   Cardiovascular: Negative for chest pain and palpitations.  Gastrointestinal: Negative for abdominal pain and blood in stool.  Genitourinary: Negative for dysuria, frequency and hematuria.  Musculoskeletal: Positive for arthralgias and stiffness. Negative for back pain and neck pain.  Skin: Negative.   Neurological: Negative for dizziness, seizures and speech difficulty.  Psychiatric/Behavioral: Negative for hallucinations and confusion.      Allergies  Gabapentin  Home Medications   Prior to Admission medications   Medication Sig Start  Date End Date Taking? Authorizing Provider  aspirin EC 81 MG tablet Take 81 mg by mouth daily.   Yes Historical Provider, MD  docusate sodium (COLACE) 100 MG capsule Take 100 mg by mouth 2 (two) times daily.   Yes Historical Provider, MD  Multiple Vitamin (MULTIVITAMIN) capsule Take 1 capsule by mouth daily.   Yes Historical Provider, MD  phentermine 37.5 MG capsule Take 37.5 mg by mouth every morning.   Yes Historical Provider, MD  simvastatin (ZOCOR) 40 MG tablet Take 40 mg by mouth every evening.   Yes Historical Provider, MD  ALPRAZolam Duanne Moron) 1 MG tablet Take 1 mg by mouth at bedtime as needed. For sleep    Historical Provider, MD  Diclofenac Sodium 3 % GEL Apply to the right knee 2 or 3 times daily 11/07/15   Lily Kocher, PA-C  HYDROcodone-acetaminophen (NORCO/VICODIN) 5-325 MG tablet Take 1 tablet by mouth every 4 (four) hours as needed. 11/07/15   Lily Kocher, PA-C  ibuprofen (ADVIL,MOTRIN) 200 MG tablet Take 400 mg by mouth every 6 (six) hours as needed. Reported on 08/27/2015    Historical Provider, MD  Na Sulfate-K Sulfate-Mg Sulf (SUPREP BOWEL PREP) SOLN Take 1 kit by mouth as directed. Patient not taking: Reported on 11/07/2015 08/27/15   Danie Binder, MD   BP 137/94 mmHg  Pulse 100  Temp(Src) 99.3 F (37.4 C) (Oral)  Resp 16  Ht '5\' 4"'  (1.626 m)  Wt 86.183 kg  BMI 32.60 kg/m2  SpO2 99% Physical Exam  Constitutional: She is oriented to person, place, and time. She appears well-developed and well-nourished.  Non-toxic appearance.  HENT:  Head: Normocephalic.  Right Ear: Tympanic membrane and external ear normal.  Left Ear: Tympanic membrane and external ear normal.  Eyes: EOM and lids are normal. Pupils are equal, round, and reactive to light.  Neck: Normal range of motion. Neck supple. Carotid bruit is not present.  Cardiovascular: Normal rate, regular rhythm, normal heart sounds, intact distal pulses and normal pulses.   Pulmonary/Chest: Breath sounds normal. No  respiratory distress.  Abdominal: Soft. Bowel sounds are normal. There is no tenderness. There is no guarding.  Musculoskeletal:       Right knee: She exhibits decreased range of motion and swelling. She exhibits no effusion, no deformity and no erythema. Tenderness found.  Diffuse tenderness. No hot joint. No red streaks. No deformity noted.. No Posterior mass. No calf pain/swelling. Neg Homan's sign on the right and left.  Lymphadenopathy:       Head (right side): No submandibular adenopathy present.       Head (left side): No submandibular adenopathy  present.    She has no cervical adenopathy.  Neurological: She is alert and oriented to person, place, and time. She has normal strength. No cranial nerve deficit or sensory deficit.  Skin: Skin is warm and dry.  Psychiatric: She has a normal mood and affect. Her speech is normal.  Nursing note and vitals reviewed.   ED Course  Procedures (including critical care time) Labs Review Labs Reviewed  BASIC METABOLIC PANEL - Abnormal; Notable for the following:    Glucose, Bld 126 (*)    All other components within normal limits  CBC WITH DIFFERENTIAL/PLATELET - Abnormal; Notable for the following:    RBC 3.81 (*)    Hemoglobin 11.6 (*)    HCT 34.9 (*)    All other components within normal limits    Imaging Review Dg Knee 2 Views Right  11/07/2015  CLINICAL DATA:  65 year old female with right knee pain and swelling for the past several days. No known injury. History of knee replacement in 2008 EXAM: RIGHT KNEE - 1-2 VIEW COMPARISON:  Prior radiographs 10/27/2014 FINDINGS: Surgical changes of prior total knee arthroplasty without evidence of complication. No evidence of fracture or malalignment. Trace atherosclerotic calcification in the visualized superficial femoral and popliteal arteries. No knee joint effusion. IMPRESSION: Surgical changes of total knee arthroplasty without evidence of hardware complication. Arterial calcifications.  Electronically Signed   By: Jacqulynn Cadet M.D.   On: 11/07/2015 14:02   I have personally reviewed and evaluated these images and lab results as part of my medical decision-making.   EKG Interpretation None      MDM  Vital signs wnl. CBC neg for signs of major infection. Bmet neg for e'lyte infection or problem.  Xray of the knee reveals surgical changes of TKR, but no hardware changes. Suspect inflammatory attack.  Pt to be treated with diclofenac gel, and norco. Pt to see Dr Aline Brochure next week. Pt and family in agreement with plan.   Final diagnoses:  Knee pain, right    **I have reviewed nursing notes, vital signs, and all appropriate lab and imaging results for this patient.Lily Kocher, PA-C 11/09/15 [place], DO 11/09/15 2120

## 2015-12-01 ENCOUNTER — Encounter: Payer: Self-pay | Admitting: Adult Health

## 2015-12-01 ENCOUNTER — Ambulatory Visit (INDEPENDENT_AMBULATORY_CARE_PROVIDER_SITE_OTHER): Payer: Medicare Other | Admitting: Adult Health

## 2015-12-01 ENCOUNTER — Other Ambulatory Visit (INDEPENDENT_AMBULATORY_CARE_PROVIDER_SITE_OTHER): Payer: Medicare Other | Admitting: Adult Health

## 2015-12-01 ENCOUNTER — Other Ambulatory Visit (HOSPITAL_COMMUNITY)
Admission: RE | Admit: 2015-12-01 | Discharge: 2015-12-01 | Disposition: A | Discharge status: Home / Self Care | Payer: Medicare Other | Source: Ambulatory Visit | Attending: Adult Health | Admitting: Adult Health

## 2015-12-01 VITALS — BP 120/76 | HR 78 | Ht 64.5 in | Wt 197.0 lb

## 2015-12-01 DIAGNOSIS — Z124 Encounter for screening for malignant neoplasm of cervix: Secondary | ICD-10-CM | POA: Diagnosis not present

## 2015-12-01 DIAGNOSIS — Z01419 Encounter for gynecological examination (general) (routine) without abnormal findings: Secondary | ICD-10-CM | POA: Diagnosis present

## 2015-12-01 DIAGNOSIS — Z1151 Encounter for screening for human papillomavirus (HPV): Secondary | ICD-10-CM | POA: Insufficient documentation

## 2015-12-01 NOTE — Progress Notes (Signed)
Subjective:     Patient ID: Audrey FinchBarbara K Marquez, female   DOB: [birth year], 65 y.o.   MRN: [id number]  HPI Audrey MccreedyBarbara is a 65 year old white female, married in for a pap smear only.She says she had mammogram in October and colonoscopy this year. PCP is Dr Sudie BaileyKnowlton.  Review of Systems Patient denies any headaches, hearing loss, fatigue, blurred vision, shortness of breath, chest pain, abdominal pain, problems with bowel movements, urination, or intercourse(not having sex). No  mood swings.Has body aches. Reviewed past medical,surgical, social and family history. Reviewed medications and allergies.     Objective:   Physical Exam BP 120/76 mmHg  Pulse 78  Ht 5' 4.5" (1.638 m)  Wt 197 lb (89.359 kg)  BMI 33.31 kg/m2    Skin warm and dry, tan. Breasts:no dominate palpable mass, retraction or nipple discharge(performed at her request). Pelvic: external genitalia is normal in appearance, has some loss of color, vagina:has loss of moisture and rugae, and is pale,urethra has no lesions or masses noted, cervix:smooth and bulbous,pap with HPV performed, uterus: normal size, shape and contour, non tender, no masses felt, adnexa: no masses or tenderness noted. Bladder is non tender and no masses felt.   Assessment:     Pap smear     Plan:     Pap smear in 2-3 years Mammogram yearly Labs with PCP Colonoscopy in 10 years

## 2015-12-01 NOTE — Patient Instructions (Signed)
Pap smear in 2 years Mammogram yearly Labs with PCP

## 2015-12-02 LAB — CYTOLOGY - PAP

## 2016-01-07 ENCOUNTER — Emergency Department (HOSPITAL_COMMUNITY): Payer: Medicare Other

## 2016-01-07 ENCOUNTER — Emergency Department (HOSPITAL_COMMUNITY)
Admission: EM | Admit: 2016-01-07 | Discharge: 2016-01-07 | Disposition: A | Discharge status: Home / Self Care | Payer: Medicare Other | Attending: Emergency Medicine | Admitting: Emergency Medicine

## 2016-01-07 ENCOUNTER — Encounter (HOSPITAL_COMMUNITY): Payer: Self-pay | Admitting: Emergency Medicine

## 2016-01-07 DIAGNOSIS — S42301A Unspecified fracture of shaft of humerus, right arm, initial encounter for closed fracture: Secondary | ICD-10-CM

## 2016-01-07 DIAGNOSIS — Y92009 Unspecified place in unspecified non-institutional (private) residence as the place of occurrence of the external cause: Secondary | ICD-10-CM | POA: Diagnosis not present

## 2016-01-07 DIAGNOSIS — Z79891 Long term (current) use of opiate analgesic: Secondary | ICD-10-CM | POA: Insufficient documentation

## 2016-01-07 DIAGNOSIS — M1990 Unspecified osteoarthritis, unspecified site: Secondary | ICD-10-CM | POA: Insufficient documentation

## 2016-01-07 DIAGNOSIS — W1809XA Striking against other object with subsequent fall, initial encounter: Secondary | ICD-10-CM | POA: Insufficient documentation

## 2016-01-07 DIAGNOSIS — Z87891 Personal history of nicotine dependence: Secondary | ICD-10-CM | POA: Diagnosis not present

## 2016-01-07 DIAGNOSIS — S42401A Unspecified fracture of lower end of right humerus, initial encounter for closed fracture: Secondary | ICD-10-CM | POA: Diagnosis not present

## 2016-01-07 DIAGNOSIS — M79621 Pain in right upper arm: Secondary | ICD-10-CM | POA: Diagnosis present

## 2016-01-07 DIAGNOSIS — Y999 Unspecified external cause status: Secondary | ICD-10-CM | POA: Diagnosis not present

## 2016-01-07 DIAGNOSIS — S42209A Unspecified fracture of upper end of unspecified humerus, initial encounter for closed fracture: Secondary | ICD-10-CM

## 2016-01-07 DIAGNOSIS — Z79899 Other long term (current) drug therapy: Secondary | ICD-10-CM | POA: Diagnosis not present

## 2016-01-07 DIAGNOSIS — Z7982 Long term (current) use of aspirin: Secondary | ICD-10-CM | POA: Insufficient documentation

## 2016-01-07 DIAGNOSIS — Y939 Activity, unspecified: Secondary | ICD-10-CM | POA: Diagnosis not present

## 2016-01-07 MED ORDER — HYDROMORPHONE HCL 1 MG/ML IJ SOLN
1.0000 mg | Freq: Once | INTRAMUSCULAR | Status: AC
  Administered 2016-01-07: 1 mg via INTRAVENOUS
  Filled 2016-01-07: qty 1

## 2016-01-07 MED ORDER — OXYCODONE-ACETAMINOPHEN 5-325 MG PO TABS: 2.0000 | ORAL_TABLET | ORAL | Status: DC | PRN

## 2016-01-07 MED ORDER — ONDANSETRON HCL 4 MG/2ML IJ SOLN
4.0000 mg | Freq: Once | INTRAMUSCULAR | Status: AC
  Administered 2016-01-07: 4 mg via INTRAVENOUS
  Filled 2016-01-07: qty 2

## 2016-01-07 MED ORDER — HYDROMORPHONE HCL 1 MG/ML IJ SOLN
0.5000 mg | Freq: Once | INTRAMUSCULAR | Status: AC
  Administered 2016-01-07: 0.5 mg via INTRAVENOUS
  Filled 2016-01-07: qty 1

## 2016-01-07 MED ORDER — OXYCODONE-ACETAMINOPHEN 5-325 MG PO TABS
1.0000 | ORAL_TABLET | Freq: Once | ORAL | Status: AC
  Administered 2016-01-07: 1 via ORAL
  Filled 2016-01-07: qty 1

## 2016-01-07 NOTE — ED Notes (Signed)
Pt c/o rt arm pain after fall. Pt states it hurts between the shoulder and elbow.

## 2016-01-07 NOTE — ED Provider Notes (Signed)
CSN: [id number]     Arrival date & time 01/07/16  2024 History   First MD Initiated Contact with Patient 01/07/16 2044     Chief Complaint  Patient presents with  . Arm Pain     (Consider location/radiation/quality/duration/timing/severity/associated sxs/prior Treatment) Patient is a 65 y.o. female presenting with arm pain. The history is provided by the patient.  Arm Pain This is a new problem. The current episode started today. The problem occurs constantly. The problem has been gradually worsening.   Audrey FinchBarbara K Marquez is a 65 y.o. female who presents to the ED for right upper arm pain s/p fall. She reports that she fell over a pillow that was in the floor inside her home. She hit a door and felt a pop in her upper arm followed by severe pain. She rates her pain as 10/10. She denies head injury or LOC.     Past Medical History  Diagnosis Date  . High cholesterol   . Arthritis   . Anxiety   . Carpal tunnel syndrome on both sides   . Knee joint replacement status    Past Surgical History  Procedure Laterality Date  . Knee surgery    . Laminectomy    . Appendectomy    . Joint replacement  2008    bilateral knees, [facility]; [facility]  . Bunionectomy  09/29/2011    RIGHT  . Carpal tunnel release  04/13/2012    rocedure: CARPAL TUNNEL RELEASE;  Surgeon: Vickki HearingStanley E [place], MD;  Location: [facility];  Service: Orthopedics;  Laterality: Right;  . Colonoscopy N/A 09/11/2015    Procedure: COLONOSCOPY;  Surgeon: West BaliSandi L Fields, MD;  Location: [facility];  Service: Endoscopy;  Laterality: N/A;  1000  . Breast cyst excision     Family History  Problem Relation Age of Onset  . Anesthesia problems Neg Hx   . Hypotension Neg Hx   . Malignant hyperthermia Neg Hx   . Pseudochol deficiency Neg Hx   . Dementia Mother   . Arthritis Mother   . Hypertension Mother   . Colon polyps Mother   . Cancer Father     prostate, lung  . COPD Father   . Heart disease Father   . Colon polyps Father   .  Hyperlipidemia Sister   . Colon polyps Sister   . Hyperlipidemia Brother   . Colon polyps Brother   . Birth defects Son   . Early death Son   . Congestive Heart Failure Paternal Aunt   . Cancer Maternal Grandmother     lung  . Diabetes Maternal Grandfather   . Heart attack Maternal Grandfather   . Heart disease Paternal Grandmother   . Stroke Paternal Grandmother   . Congestive Heart Failure Paternal Grandmother   . Cancer Paternal Grandfather     lung  . Cancer Brother     liver  . Diabetes Brother   . Irritable bowel syndrome Daughter   . Congestive Heart Failure Paternal Aunt    Social History  Substance Use Topics  . Smoking status: Former Smoker    Quit date: 11/26/2010  . Smokeless tobacco: Never Used  . Alcohol Use: Yes     Comment: occasional wine   OB History    Gravida Para Term Preterm AB TAB SAB Ectopic Multiple Living   4 4        3      Review of Systems Negative except as stated in HPI   Allergies  Gabapentin  Home Medications   Prior to Admission medications   Medication Sig Start Date End Date Taking? Authorizing Provider  ALPRAZolam Prudy Feeler) 1 MG tablet Take 1 mg by mouth at bedtime as needed for sleep.    Yes Historical Provider, MD  aspirin EC 81 MG tablet Take 81 mg by mouth daily.   Yes Historical Provider, MD  docusate sodium (COLACE) 100 MG capsule Take 100 mg by mouth 2 (two) times daily.   Yes Historical Provider, MD  HYDROcodone-acetaminophen (NORCO/VICODIN) 5-325 MG tablet Take 1 tablet by mouth every 4 (four) hours as needed. Patient taking differently: Take 1 tablet by mouth every 4 (four) hours as needed (For pain.).  11/07/15  Yes Ivery Quale, PA-C  Multiple Vitamin (MULTIVITAMIN) capsule Take 1 capsule by mouth daily.   Yes Historical Provider, MD  simvastatin (ZOCOR) 40 MG tablet Take 40 mg by mouth every evening.   Yes Historical Provider, MD  oxyCODONE-acetaminophen (PERCOCET/ROXICET) 5-325 MG tablet Take 2 tablets by mouth every  4 (four) hours as needed for severe pain. 01/07/16   Nehal Witting Orlene Och, NP   BP 123/66 mmHg  Pulse 85  Temp(Src) 97.4 F (36.3 C)  Resp 18  Ht 5\' 4"  (1.626 m)  Wt 90.719 kg  BMI 34.31 kg/m2  SpO2 92% Physical Exam  Constitutional: She is oriented to person, place, and time. She appears well-developed and well-nourished. No distress.  HENT:  Head: Normocephalic and atraumatic.  Eyes: EOM are normal.  Neck: Neck supple.  Cardiovascular: Normal rate.   Pulmonary/Chest: Effort normal.  Musculoskeletal:       Left upper arm: She exhibits tenderness, bony tenderness, swelling and deformity.  Radial pulses 2+, adequate circulation.   Neurological: She is alert and oriented to person, place, and time. No cranial nerve deficit.  Skin: Skin is warm and dry.  Psychiatric: She has a normal mood and affect. Her behavior is normal.  Nursing note and vitals reviewed.   ED Course  Procedures (including critical care time) Discussed with Dr. Romeo Apple and he reviewed the films. He has a call in to Dr. Roda Shutters. I spoke with Dr. Merlyn Lot, on call for hand, and he does not operate on the upper arm Spoke back with Dr. Romeo Apple and he will be in to see the patient and discuss plan of care.   Patient placed in arm sling and ace wrap arm to chest Ice  Pain management.  Saline lock  Labs Review Labs Reviewed - No data to display  Imaging Review Dg Shoulder Right  01/07/2016  CLINICAL DATA:  Trip and fall injury, landing on the right arm. Right shoulder and right humerus pain. EXAM: RIGHT SHOULDER - 2+ VIEW COMPARISON:  Right humerus 01/07/2016 FINDINGS: Comminuted fractures are demonstrated in the proximal right humerus. See additional report of right humerus from today's date. Degenerative changes in the glenohumeral joint. No evidence of acute fracture or dislocation of the right shoulder. Visualized clavicle appears intact. IMPRESSION: Comminuted fractures of the proximal right humeral shaft. Degenerative  changes in the right shoulder. Electronically Signed   By: Burman Nieves M.D.   On: 01/07/2016 21:14   Dg Humerus Right  01/07/2016  CLINICAL DATA:  Tripped and fell and landed on right arm. EXAM: RIGHT HUMERUS - 2+ VIEW COMPARISON:  None. FINDINGS: Comminuted and displaced fracture involving the proximal and mid right humerus. Evidence for a large butterfly fragment. Visualized right ribs are intact. Right glenohumeral joint is probably intact but difficult to evaluate. IMPRESSION: Comminuted and displaced  fracture of the right humerus. Electronically Signed   By: Richarda Overlie M.D.   On: 01/07/2016 21:14   I have personally reviewed and evaluated these images as part of my medical decision-making.   MDM  65 y.o. female with right upper arm pain s/p fall stable for d/c without neurovascular deficits. Dr. Romeo Apple in to examine the patient and to arrange f/u for the patient. Patient's daughter works in Dr. Mort Sawyers office and will f/u with plan. Dr. Romeo Apple wrote the patient Rx for Percocet. Prepack given for patient to go home with.   Final diagnoses:  Fracture of right humerus, closed, initial encounter      Janne Napoleon, NP 01/08/16 1754  Samuel Jester, DO 01/09/16 [phone]

## 2016-01-07 NOTE — Discharge Instructions (Signed)
Follow the instructions that Dr. Romeo AppleHarrison gave you. Get the Rx for the Percocet filled.

## 2016-01-08 ENCOUNTER — Other Ambulatory Visit: Payer: Self-pay | Admitting: Orthopedic Surgery

## 2016-01-08 ENCOUNTER — Encounter (INDEPENDENT_AMBULATORY_CARE_PROVIDER_SITE_OTHER): Payer: Medicare Other | Admitting: Orthopedic Surgery

## 2016-01-08 ENCOUNTER — Encounter (HOSPITAL_COMMUNITY): Payer: Self-pay | Admitting: *Deleted

## 2016-01-08 NOTE — Progress Notes (Unsigned)
Patient ID: [name], female   DOB: 03/14/1951, 65 y.o.   MRN: [id number]  Consult on 01/08/16 requested by Dr Cira Servant in ER   Cc: pain right arm x today (01/08/16)  Hpi : fall at home   C/o severe throbbing right shoulder and arm pain right shoulder and arm with deformity of right humerus  Ros: tingling none; other acute joint pain none   Past Medical History  Diagnosis Date  . High cholesterol   . Arthritis   . Anxiety   . Carpal tunnel syndrome on both sides   . Knee joint replacement status    Past Surgical History  Procedure Laterality Date  . Knee surgery    . Laminectomy    . Appendectomy    . Joint replacement  2008    bilateral knees, [facility]; [facility]  . Bunionectomy  09/29/2011    RIGHT  . Carpal tunnel release  04/13/2012    rocedure: CARPAL TUNNEL RELEASE;  Surgeon: Vickki Hearing, MD;  Location: [facility];  Service: Orthopedics;  Laterality: Right;  . Colonoscopy N/A 09/11/2015    Procedure: COLONOSCOPY;  Surgeon: West Bali, MD;  Location: [facility];  Service: Endoscopy;  Laterality: N/A;  1000  . Breast cyst excision      Current outpatient prescriptions:  .  ALPRAZolam (XANAX) 1 MG tablet, Take 1 mg by mouth at bedtime as needed. For sleep, Disp: , Rfl:  .  aspirin EC 81 MG tablet, Take 81 mg by mouth daily., Disp: , Rfl:  .  docusate sodium (COLACE) 100 MG capsule, Take 100 mg by mouth 2 (two) times daily., Disp: , Rfl:  .  HYDROcodone-acetaminophen (NORCO/VICODIN) 5-325 MG tablet, Take 1 tablet by mouth every 4 (four) hours as needed., Disp: 15 tablet, Rfl: 0 .  Multiple Vitamin (MULTIVITAMIN) capsule, Take 1 capsule by mouth daily., Disp: , Rfl:  .  oxyCODONE-acetaminophen (PERCOCET/ROXICET) 5-325 MG tablet, Take 2 tablets by mouth every 4 (four) hours as needed for severe pain., Disp: 6 tablet, Rfl: 0 .  phentermine 37.5 MG capsule, Take 37.5 mg by mouth every morning. Reported on 12/01/2015, Disp: , Rfl:  .  simvastatin (ZOCOR) 40 MG tablet, Take 40  mg by mouth every evening., Disp: , Rfl:    Vitals are recorded in er record, stable and reviewed  Physical Exam  Constitutional: She is oriented to person, place, and time. She appears well-developed and well-nourished. Moderate anxiety and distress.  Cardiovascular: Intact distal pulses and perfusion color right hand    Neurological: She is alert and oriented o person, place, and time. She exhibits normal muscle tone. Soft touch normal right upper extremity   Skin: Skin is warm and dry. No rash noted. No erythema. No pallor.  Psychiatric: She has a anxious mood and affect. Her behavior is normal. Judgment and thought content normal.  Gait is normal   Right upper extremity  Inspect: mid to proximal shaft deformity, tender at fracture site Rom: deferred for pain hand normal  stabilty could not test 2nd pain acute fracture elbow wrist reduced  Muscle tone tense biceps triceps and traps   Left arm normal rom muscle tone and pulse   Lower extremities rt and lt normal rom and alignmnent   xrays  I read proximal 1/3 comminuted closed humerus fracture with large butterfly fragment, apex anterior angulation   I ve called Dr Roda Shutters and he has recommended traumatologist   I place a sling and coaptation splint  Dx closed rt proximal hum fracture , initial  Plan  Discharge on percocet 7.5 and continue to work on arranging follow up

## 2016-01-08 NOTE — Progress Notes (Signed)
[name] states that she does not see a cardiologist, had a stress test > 10 years ago, "it was normal- they think it was stress, no follow up was required.  Has not has chest pain in years/

## 2016-01-11 ENCOUNTER — Ambulatory Visit (HOSPITAL_COMMUNITY): Payer: Medicare Other | Admitting: Anesthesiology

## 2016-01-11 ENCOUNTER — Observation Stay (HOSPITAL_COMMUNITY)
Admission: RE | Admit: 2016-01-11 | Discharge: 2016-01-13 | Disposition: A | Discharge status: Home / Self Care | Payer: Medicare Other | Source: Ambulatory Visit | Attending: Orthopedic Surgery | Admitting: Orthopedic Surgery

## 2016-01-11 ENCOUNTER — Ambulatory Visit (HOSPITAL_COMMUNITY): Payer: Medicare Other

## 2016-01-11 ENCOUNTER — Encounter (HOSPITAL_COMMUNITY)
Admission: RE | Disposition: A | Discharge status: Home / Self Care | Payer: Self-pay | Source: Ambulatory Visit | Attending: Orthopedic Surgery

## 2016-01-11 ENCOUNTER — Encounter (HOSPITAL_COMMUNITY): Payer: Self-pay | Admitting: *Deleted

## 2016-01-11 DIAGNOSIS — S42201A Unspecified fracture of upper end of right humerus, initial encounter for closed fracture: Secondary | ICD-10-CM | POA: Diagnosis present

## 2016-01-11 DIAGNOSIS — T148XXA Other injury of unspecified body region, initial encounter: Secondary | ICD-10-CM

## 2016-01-11 DIAGNOSIS — Z87891 Personal history of nicotine dependence: Secondary | ICD-10-CM | POA: Insufficient documentation

## 2016-01-11 DIAGNOSIS — W19XXXA Unspecified fall, initial encounter: Secondary | ICD-10-CM | POA: Insufficient documentation

## 2016-01-11 DIAGNOSIS — J449 Chronic obstructive pulmonary disease, unspecified: Secondary | ICD-10-CM | POA: Diagnosis not present

## 2016-01-11 DIAGNOSIS — S42351A Displaced comminuted fracture of shaft of humerus, right arm, initial encounter for closed fracture: Principal | ICD-10-CM | POA: Insufficient documentation

## 2016-01-11 DIAGNOSIS — S42209A Unspecified fracture of upper end of unspecified humerus, initial encounter for closed fracture: Secondary | ICD-10-CM | POA: Diagnosis present

## 2016-01-11 HISTORY — PX: ORIF HUMERUS FRACTURE: SHX2126

## 2016-01-11 HISTORY — DX: Adverse effect of unspecified anesthetic, initial encounter: T41.45XA

## 2016-01-11 HISTORY — DX: Personal history of urinary calculi: Z87.442

## 2016-01-11 HISTORY — DX: Other complications of anesthesia, initial encounter: T88.59XA

## 2016-01-11 HISTORY — DX: Reserved for inherently not codable concepts without codable children: IMO0001

## 2016-01-11 LAB — BASIC METABOLIC PANEL
Anion gap: 11 (ref 5–15)
BUN: 11 mg/dL (ref 6–20)
CALCIUM: 9 mg/dL (ref 8.9–10.3)
CO2: 21 mmol/L — AB (ref 22–32)
CREATININE: 0.74 mg/dL (ref 0.44–1.00)
Chloride: 105 mmol/L (ref 101–111)
GFR calc non Af Amer: >60 mL/min (ref >60)
Glucose, Bld: 94 mg/dL (ref 65–99)
Potassium: 4.1 mmol/L (ref 3.5–5.1)
SODIUM: 137 mmol/L (ref 135–145)

## 2016-01-11 LAB — CBC
HEMATOCRIT: 28.7 % — AB (ref 36.0–46.0)
HEMOGLOBIN: 9.2 g/dL — AB (ref 12.0–15.0)
MCH: 29.6 pg (ref 26.0–34.0)
MCHC: 32.1 g/dL (ref 30.0–36.0)
MCV: 92.3 fL (ref 78.0–100.0)
Platelets: 220 10*3/uL (ref 150–400)
RBC: 3.11 MIL/uL — ABNORMAL LOW (ref 3.87–5.11)
RDW: 13.7 % (ref 11.5–15.5)
WBC: 8.6 10*3/uL (ref 4.0–10.5)

## 2016-01-11 SURGERY — OPEN REDUCTION INTERNAL FIXATION (ORIF) PROXIMAL HUMERUS FRACTURE
Anesthesia: Regional | Site: Arm Upper | Laterality: Right

## 2016-01-11 MED ORDER — SUCCINYLCHOLINE CHLORIDE 200 MG/10ML IV SOSY
PREFILLED_SYRINGE | INTRAVENOUS | Status: AC
  Filled 2016-01-11: qty 10

## 2016-01-11 MED ORDER — ARTIFICIAL TEARS OP OINT
TOPICAL_OINTMENT | OPHTHALMIC | Status: DC | PRN
  Administered 2016-01-11: 1 via OPHTHALMIC

## 2016-01-11 MED ORDER — FENTANYL CITRATE (PF) 100 MCG/2ML IJ SOLN
INTRAMUSCULAR | Status: DC | PRN
  Administered 2016-01-11: 50 ug via INTRAVENOUS

## 2016-01-11 MED ORDER — NEOSTIGMINE METHYLSULFATE 10 MG/10ML IV SOLN
INTRAVENOUS | Status: DC | PRN
  Administered 2016-01-11: 3 mg via INTRAVENOUS

## 2016-01-11 MED ORDER — MENTHOL 3 MG MT LOZG
1.0000 | LOZENGE | OROMUCOSAL | Status: DC | PRN
  Filled 2016-01-11: qty 9

## 2016-01-11 MED ORDER — LIDOCAINE 2% (20 MG/ML) 5 ML SYRINGE
INTRAMUSCULAR | Status: AC
  Filled 2016-01-11: qty 10

## 2016-01-11 MED ORDER — CHLORHEXIDINE GLUCONATE 4 % EX LIQD: 60.0000 mL | Freq: Once | CUTANEOUS | Status: DC

## 2016-01-11 MED ORDER — MIDAZOLAM HCL 2 MG/2ML IJ SOLN
INTRAMUSCULAR | Status: AC
  Administered 2016-01-11: 2 mg
  Filled 2016-01-11: qty 2

## 2016-01-11 MED ORDER — ONDANSETRON HCL 4 MG/2ML IJ SOLN
INTRAMUSCULAR | Status: AC
  Filled 2016-01-11: qty 2

## 2016-01-11 MED ORDER — DEXAMETHASONE SODIUM PHOSPHATE 10 MG/ML IJ SOLN
INTRAMUSCULAR | Status: AC
  Filled 2016-01-11: qty 1

## 2016-01-11 MED ORDER — ADULT MULTIVITAMIN W/MINERALS CH
1.0000 | ORAL_TABLET | Freq: Every day | ORAL | Status: DC
  Administered 2016-01-12 – 2016-01-13 (×2): 1 via ORAL
  Filled 2016-01-11 (×2): qty 1

## 2016-01-11 MED ORDER — BUPIVACAINE HCL (PF) 0.25 % IJ SOLN
INTRAMUSCULAR | Status: AC
  Filled 2016-01-11: qty 30

## 2016-01-11 MED ORDER — FENTANYL CITRATE (PF) 100 MCG/2ML IJ SOLN
100.0000 ug | Freq: Once | INTRAMUSCULAR | Status: AC
  Administered 2016-01-11: 100 ug via INTRAVENOUS
  Filled 2016-01-11: qty 2

## 2016-01-11 MED ORDER — CEFAZOLIN SODIUM-DEXTROSE 2-4 GM/100ML-% IV SOLN
2.0000 g | Freq: Three times a day (TID) | INTRAVENOUS | Status: AC
  Administered 2016-01-12 (×3): 2 g via INTRAVENOUS
  Filled 2016-01-11 (×3): qty 100

## 2016-01-11 MED ORDER — LIDOCAINE HCL (CARDIAC) 20 MG/ML IV SOLN
INTRAVENOUS | Status: DC | PRN
  Administered 2016-01-11: 40 mg via INTRAVENOUS

## 2016-01-11 MED ORDER — CEFAZOLIN SODIUM-DEXTROSE 2-4 GM/100ML-% IV SOLN
2.0000 g | INTRAVENOUS | Status: AC
  Administered 2016-01-11: 2 g via INTRAVENOUS
  Filled 2016-01-11: qty 100

## 2016-01-11 MED ORDER — GLYCOPYRROLATE 0.2 MG/ML IV SOSY
PREFILLED_SYRINGE | INTRAVENOUS | Status: AC
  Filled 2016-01-11: qty 3

## 2016-01-11 MED ORDER — DOCUSATE SODIUM 100 MG PO CAPS
100.0000 mg | ORAL_CAPSULE | Freq: Two times a day (BID) | ORAL | Status: DC
  Administered 2016-01-12 – 2016-01-13 (×4): 100 mg via ORAL
  Filled 2016-01-11 (×4): qty 1

## 2016-01-11 MED ORDER — ROCURONIUM BROMIDE 100 MG/10ML IV SOLN
INTRAVENOUS | Status: DC | PRN
  Administered 2016-01-11: 30 mg via INTRAVENOUS
  Administered 2016-01-11: 50 mg via INTRAVENOUS

## 2016-01-11 MED ORDER — HYDROCODONE-ACETAMINOPHEN 7.5-325 MG PO TABS: 1.0000 | ORAL_TABLET | Freq: Once | ORAL | Status: DC | PRN

## 2016-01-11 MED ORDER — ONDANSETRON HCL 4 MG/2ML IJ SOLN
INTRAMUSCULAR | Status: DC | PRN
  Administered 2016-01-11: 4 mg via INTRAVENOUS

## 2016-01-11 MED ORDER — MORPHINE SULFATE (PF) 2 MG/ML IV SOLN
2.0000 mg | INTRAVENOUS | Status: DC | PRN
  Administered 2016-01-12 (×2): 2 mg via INTRAVENOUS
  Filled 2016-01-11 (×3): qty 1

## 2016-01-11 MED ORDER — ONDANSETRON HCL 4 MG PO TABS: 4.0000 mg | ORAL_TABLET | Freq: Four times a day (QID) | ORAL | Status: DC | PRN

## 2016-01-11 MED ORDER — ONDANSETRON HCL 4 MG/2ML IJ SOLN
INTRAMUSCULAR | Status: AC
  Administered 2016-01-11: 4 mg via INTRAVENOUS
  Filled 2016-01-11: qty 2

## 2016-01-11 MED ORDER — FENTANYL CITRATE (PF) 100 MCG/2ML IJ SOLN: 25.0000 ug | INTRAMUSCULAR | Status: DC | PRN

## 2016-01-11 MED ORDER — MULTIVITAMINS PO CAPS: 1.0000 | ORAL_CAPSULE | Freq: Every day | ORAL | Status: DC

## 2016-01-11 MED ORDER — POTASSIUM CHLORIDE IN NACL 20-0.9 MEQ/L-% IV SOLN
INTRAVENOUS | Status: AC
  Administered 2016-01-12: 75 mL/h via INTRAVENOUS
  Filled 2016-01-11: qty 1000

## 2016-01-11 MED ORDER — OXYCODONE-ACETAMINOPHEN 5-325 MG PO TABS
1.0000 | ORAL_TABLET | Freq: Once | ORAL | Status: AC
  Administered 2016-01-11: 1 via ORAL
  Filled 2016-01-11: qty 1

## 2016-01-11 MED ORDER — ALPRAZOLAM 0.5 MG PO TABS: 1.0000 mg | ORAL_TABLET | Freq: Every evening | ORAL | Status: DC | PRN

## 2016-01-11 MED ORDER — PHENYLEPHRINE 40 MCG/ML (10ML) SYRINGE FOR IV PUSH (FOR BLOOD PRESSURE SUPPORT)
PREFILLED_SYRINGE | INTRAVENOUS | Status: AC
  Filled 2016-01-11: qty 20

## 2016-01-11 MED ORDER — 0.9 % SODIUM CHLORIDE (POUR BTL) OPTIME
TOPICAL | Status: DC | PRN
  Administered 2016-01-11 (×4): 1000 mL

## 2016-01-11 MED ORDER — ACETAMINOPHEN 650 MG RE SUPP: 650.0000 mg | Freq: Four times a day (QID) | RECTAL | Status: DC | PRN

## 2016-01-11 MED ORDER — METOCLOPRAMIDE HCL 5 MG PO TABS: 5.0000 mg | ORAL_TABLET | Freq: Three times a day (TID) | ORAL | Status: DC | PRN

## 2016-01-11 MED ORDER — PHENYLEPHRINE HCL 10 MG/ML IJ SOLN
INTRAMUSCULAR | Status: DC | PRN
  Administered 2016-01-11: 80 ug via INTRAVENOUS

## 2016-01-11 MED ORDER — PROPOFOL 10 MG/ML IV BOLUS
INTRAVENOUS | Status: AC
  Filled 2016-01-11: qty 20

## 2016-01-11 MED ORDER — LACTATED RINGERS IV SOLN
INTRAVENOUS | Status: DC | PRN
  Administered 2016-01-11 (×3): via INTRAVENOUS

## 2016-01-11 MED ORDER — ROCURONIUM BROMIDE 50 MG/5ML IV SOLN
INTRAVENOUS | Status: AC
  Filled 2016-01-11: qty 2

## 2016-01-11 MED ORDER — PROPOFOL 10 MG/ML IV BOLUS
INTRAVENOUS | Status: DC | PRN
  Administered 2016-01-11: 150 mg via INTRAVENOUS

## 2016-01-11 MED ORDER — FENTANYL CITRATE (PF) 250 MCG/5ML IJ SOLN
INTRAMUSCULAR | Status: AC
  Filled 2016-01-11: qty 5

## 2016-01-11 MED ORDER — ASPIRIN EC 325 MG PO TBEC
325.0000 mg | DELAYED_RELEASE_TABLET | Freq: Every day | ORAL | Status: DC
  Administered 2016-01-12 – 2016-01-13 (×2): 325 mg via ORAL
  Filled 2016-01-11 (×2): qty 1

## 2016-01-11 MED ORDER — SIMVASTATIN 40 MG PO TABS
40.0000 mg | ORAL_TABLET | Freq: Every evening | ORAL | Status: DC
  Administered 2016-01-12 (×2): 40 mg via ORAL
  Filled 2016-01-11 (×2): qty 1

## 2016-01-11 MED ORDER — ROCURONIUM BROMIDE 50 MG/5ML IV SOLN
INTRAVENOUS | Status: AC
  Filled 2016-01-11: qty 1

## 2016-01-11 MED ORDER — METHOCARBAMOL 500 MG PO TABS
500.0000 mg | ORAL_TABLET | Freq: Four times a day (QID) | ORAL | Status: DC | PRN
  Administered 2016-01-12 – 2016-01-13 (×3): 500 mg via ORAL
  Filled 2016-01-11 (×4): qty 1

## 2016-01-11 MED ORDER — SUGAMMADEX SODIUM 200 MG/2ML IV SOLN
INTRAVENOUS | Status: AC
  Filled 2016-01-11: qty 2

## 2016-01-11 MED ORDER — ONDANSETRON HCL 4 MG/2ML IJ SOLN
4.0000 mg | Freq: Once | INTRAMUSCULAR | Status: AC | PRN
  Administered 2016-01-11: 4 mg via INTRAVENOUS

## 2016-01-11 MED ORDER — ONDANSETRON HCL 4 MG/2ML IJ SOLN: 4.0000 mg | Freq: Four times a day (QID) | INTRAMUSCULAR | Status: DC | PRN

## 2016-01-11 MED ORDER — MIDAZOLAM HCL 2 MG/2ML IJ SOLN
2.0000 mg | Freq: Once | INTRAMUSCULAR | Status: DC
  Filled 2016-01-11: qty 2

## 2016-01-11 MED ORDER — MIDAZOLAM HCL 2 MG/2ML IJ SOLN
INTRAMUSCULAR | Status: AC
  Filled 2016-01-11: qty 2

## 2016-01-11 MED ORDER — GLYCOPYRROLATE 0.2 MG/ML IJ SOLN
INTRAMUSCULAR | Status: DC | PRN
  Administered 2016-01-11: 0.4 mg via INTRAVENOUS

## 2016-01-11 MED ORDER — FENTANYL CITRATE (PF) 100 MCG/2ML IJ SOLN
INTRAMUSCULAR | Status: AC
  Filled 2016-01-11: qty 2

## 2016-01-11 MED ORDER — PHENOL 1.4 % MT LIQD
1.0000 | OROMUCOSAL | Status: DC | PRN
Start: 2016-01-11 — End: 2016-01-13
  Administered 2016-01-12: 1 via OROMUCOSAL
  Filled 2016-01-11: qty 177

## 2016-01-11 MED ORDER — ACETAMINOPHEN 325 MG PO TABS
650.0000 mg | ORAL_TABLET | Freq: Four times a day (QID) | ORAL | Status: DC | PRN
  Administered 2016-01-12: 650 mg via ORAL
  Filled 2016-01-11: qty 2

## 2016-01-11 MED ORDER — PHENYLEPHRINE HCL 10 MG/ML IJ SOLN
10.0000 mg | INTRAVENOUS | Status: DC | PRN
  Administered 2016-01-11: 40 ug/min via INTRAVENOUS

## 2016-01-11 MED ORDER — OXYCODONE HCL 5 MG PO TABS
5.0000 mg | ORAL_TABLET | ORAL | Status: DC | PRN
  Administered 2016-01-12: 5 mg via ORAL
  Administered 2016-01-12 – 2016-01-13 (×7): 10 mg via ORAL
  Filled 2016-01-11: qty 2
  Filled 2016-01-11: qty 1
  Filled 2016-01-11 (×6): qty 2

## 2016-01-11 MED ORDER — METHOCARBAMOL 1000 MG/10ML IJ SOLN: 500.0000 mg | Freq: Four times a day (QID) | INTRAMUSCULAR | Status: DC | PRN

## 2016-01-11 MED ORDER — OXYCODONE-ACETAMINOPHEN 5-325 MG PO TABS
ORAL_TABLET | ORAL | Status: AC
  Filled 2016-01-11: qty 1

## 2016-01-11 MED ORDER — METOCLOPRAMIDE HCL 5 MG/ML IJ SOLN: 5.0000 mg | Freq: Three times a day (TID) | INTRAMUSCULAR | Status: DC | PRN

## 2016-01-11 MED ORDER — EPHEDRINE 5 MG/ML INJ
INTRAVENOUS | Status: AC
  Filled 2016-01-11: qty 10

## 2016-01-11 SURGICAL SUPPLY — 70 items
BIT DRILL 2 FAST STEP (BIT) ×3 IMPLANT
BIT DRILL 3.2 (BIT) ×2
BIT DRILL 3.2XCALB NS DISP (BIT) ×1 IMPLANT
BIT DRILL CALIBRATED 2.7 (BIT) ×2 IMPLANT
BIT DRILL CALIBRATED 2.7MM (BIT) ×1
BIT DRL 3.2XCALB NS DISP (BIT) ×1
COVER SURGICAL LIGHT HANDLE (MISCELLANEOUS) ×3 IMPLANT
DRAPE C-ARM 42X72 X-RAY (DRAPES) ×3 IMPLANT
DRAPE IMP U-DRAPE 54X76 (DRAPES) ×3 IMPLANT
DRAPE INCISE IOBAN 66X45 STRL (DRAPES) ×3 IMPLANT
DRAPE U-SHAPE 47X51 STRL (DRAPES) ×6 IMPLANT
DRIVER PEG 2.0 FAST (Orthopedic Implant) ×3 IMPLANT
DRSG AQUACEL AG ADV 3.5X14 (GAUZE/BANDAGES/DRESSINGS) ×3 IMPLANT
DURAPREP 26ML APPLICATOR (WOUND CARE) ×6 IMPLANT
ELECT REM PT RETURN 9FT ADLT (ELECTROSURGICAL) ×3
ELECTRODE REM PT RTRN 9FT ADLT (ELECTROSURGICAL) ×1 IMPLANT
FACESHIELD WRAPAROUND (MASK) ×3 IMPLANT
GAUZE XEROFORM 5X9 LF (GAUZE/BANDAGES/DRESSINGS) ×3 IMPLANT
GLOVE BIOGEL PI IND STRL 7.0 (GLOVE) ×3 IMPLANT
GLOVE BIOGEL PI IND STRL 8 (GLOVE) ×1 IMPLANT
GLOVE BIOGEL PI INDICATOR 7.0 (GLOVE) ×6
GLOVE BIOGEL PI INDICATOR 8 (GLOVE) ×2
GLOVE SURG ORTHO 8.0 STRL STRW (GLOVE) ×3 IMPLANT
GOWN STRL REUS W/ TWL LRG LVL3 (GOWN DISPOSABLE) ×2 IMPLANT
GOWN STRL REUS W/ TWL XL LVL3 (GOWN DISPOSABLE) ×1 IMPLANT
GOWN STRL REUS W/TWL LRG LVL3 (GOWN DISPOSABLE) ×4
GOWN STRL REUS W/TWL XL LVL3 (GOWN DISPOSABLE) ×2
K-WIRE 2X5 SS THRDED S3 (WIRE) ×6
KIT BASIN OR (CUSTOM PROCEDURE TRAY) ×3 IMPLANT
KIT ROOM TURNOVER OR (KITS) ×3 IMPLANT
KWIRE 2X5 SS THRDED S3 (WIRE) ×2 IMPLANT
MANIFOLD NEPTUNE II (INSTRUMENTS) ×3 IMPLANT
NS IRRIG 1000ML POUR BTL (IV SOLUTION) ×3 IMPLANT
PACK SHOULDER (CUSTOM PROCEDURE TRAY) ×3 IMPLANT
PACK UNIVERSAL I (CUSTOM PROCEDURE TRAY) ×3 IMPLANT
PAD ARMBOARD 7.5X6 YLW CONV (MISCELLANEOUS) ×6 IMPLANT
PEG LOCKING 3.2X36 (Screw) ×3 IMPLANT
PEG LOCKING 3.2X40 (Peg) ×3 IMPLANT
PEG LOCKING 3.2X42 (Screw) ×9 IMPLANT
PEG LOCKING 3.2X52 (Peg) ×6 IMPLANT
PENCIL BUTTON HOLSTER BLD 10FT (ELECTRODE) IMPLANT
PLATE PROX HUM LO R 14H 227 ST (Plate) ×3 IMPLANT
PUTTY DBM STAGRAFT PLUS 5CC (Putty) ×3 IMPLANT
RETRIEVER SUT HEWSON (MISCELLANEOUS) ×3 IMPLANT
SCREW LOCK CORT STAR 3.5X22 (Screw) ×6 IMPLANT
SCREW LP NL T15 3.5X22 (Screw) ×9 IMPLANT
SCREW LP NL T15 3.5X24 (Screw) ×9 IMPLANT
SCREW LP NL T15 3.5X26 (Screw) ×3 IMPLANT
SCREW PEG 2.5X22 NONLOCK (Screw) ×6 IMPLANT
SCREW PEG 2.5X24 NONLOCK (Screw) ×9 IMPLANT
SLEEVE MEASURING 3.2 (BIT) ×3 IMPLANT
SPONGE LAP 4X18 X RAY DECT (DISPOSABLE) ×6 IMPLANT
SUCTION FRAZIER HANDLE 10FR (MISCELLANEOUS) ×2
SUCTION TUBE FRAZIER 10FR DISP (MISCELLANEOUS) ×1 IMPLANT
SUT ETHIBOND NAB CT1 #1 30IN (SUTURE) ×6 IMPLANT
SUT FIBERWIRE #2 38 REV NDL BL (SUTURE) ×6
SUT FIBERWIRE 2-0 18 17.9 3/8 (SUTURE) ×6
SUT VIC AB 0 CT1 27 (SUTURE) ×2
SUT VIC AB 0 CT1 27XBRD ANBCTR (SUTURE) ×1 IMPLANT
SUT VIC AB 1 CT1 27 (SUTURE) ×6
SUT VIC AB 1 CT1 27XBRD ANBCTR (SUTURE) ×3 IMPLANT
SUT VIC AB 2-0 CTB1 (SUTURE) ×12 IMPLANT
SUTURE FIBERWR 2-0 18 17.9 3/8 (SUTURE) ×2 IMPLANT
SUTURE FIBERWR#2 38 REV NDL BL (SUTURE) ×2 IMPLANT
TOWEL OR 17X24 6PK STRL BLUE (TOWEL DISPOSABLE) ×3 IMPLANT
TOWEL OR 17X26 10 PK STRL BLUE (TOWEL DISPOSABLE) ×3 IMPLANT
TUBE CONNECTING 12'X1/4 (SUCTIONS) ×1
TUBE CONNECTING 12X1/4 (SUCTIONS) ×2 IMPLANT
WATER STERILE IRR 1000ML POUR (IV SOLUTION) ×3 IMPLANT
YANKAUER SUCT BULB TIP NO VENT (SUCTIONS) ×3 IMPLANT

## 2016-01-11 NOTE — H&P (Signed)
Audrey FinchBarbara K Marquez is an 65 y.o. female.   Chief Complaint: Right shoulder pain HPI: [name] is a 65 year old patient with right shoulder pain.  She had a fall last week.  Has comminuted 4 part proximal humerus fracture which does not involve the actual humeral head.  She is an active patient and she is right-hand dominant.  She presents proper management after explanation of risks and benefits.  Injury was a mechanical fall.  She denies any other orthopedic complaints denies any loss of consciousness  Past Medical History  Diagnosis Date  . High cholesterol   . Arthritis   . Carpal tunnel syndrome on both sides   . Knee joint replacement status   . Anxiety     panic attack  . Shortness of breath dyspnea     with exertion  . History of kidney stones   . Complication of anesthesia     1st knee replacement slow to awaken, 2nd knee replacement - woke up had to have more anesthesia    Past Surgical History  Procedure Laterality Date  . Knee surgery    . Laminectomy    . Appendectomy    . Joint replacement Bilateral 2008    bilateral knees, [facility]; [facility]  . Bunionectomy  09/29/2011    RIGHT  . Carpal tunnel release  04/13/2012    rocedure: CARPAL TUNNEL RELEASE;  Surgeon: Vickki HearingStanley E [place], MD;  Location: [facility];  Service: Orthopedics;  Laterality: Right;  . Colonoscopy N/A 09/11/2015    Procedure: COLONOSCOPY;  Surgeon: West BaliSandi L Fields, MD;  Location: [facility];  Service: Endoscopy;  Laterality: N/A;  1000  . Breast cyst excision      Family History  Problem Relation Age of Onset  . Anesthesia problems Neg Hx   . Hypotension Neg Hx   . Malignant hyperthermia Neg Hx   . Pseudochol deficiency Neg Hx   . Dementia Mother   . Arthritis Mother   . Hypertension Mother   . Colon polyps Mother   . Cancer Father     prostate, lung  . COPD Father   . Heart disease Father   . Colon polyps Father   . Hyperlipidemia Sister   . Colon polyps Sister   . Hyperlipidemia Brother   . Colon  polyps Brother   . Birth defects Son   . Early death Son   . Congestive Heart Failure Paternal Aunt   . Cancer Maternal Grandmother     lung  . Diabetes Maternal Grandfather   . Heart attack Maternal Grandfather   . Heart disease Paternal Grandmother   . Stroke Paternal Grandmother   . Congestive Heart Failure Paternal Grandmother   . Cancer Paternal Grandfather     lung  . Cancer Brother     liver  . Diabetes Brother   . Irritable bowel syndrome Daughter   . Congestive Heart Failure Paternal Aunt    Social History:  reports that she quit smoking about 5 years ago. She has never used smokeless tobacco. She reports that she drinks about 1.2 oz of alcohol per week. She reports that she uses illicit drugs (Psilocybin).  Allergies:  Allergies  Allergen Reactions  . Gabapentin Rash    No prescriptions prior to admission    No results found for this or any previous visit (from the past 48 hour(s)). No results found.  Review of Systems  Constitutional: Negative.   HENT: Negative.   Eyes: Negative.   Respiratory: Negative.  Cardiovascular: Negative.   Gastrointestinal: Negative.   Genitourinary: Negative.   Musculoskeletal: Positive for joint pain.  Skin: Negative.   Neurological: Negative.   Endo/Heme/Allergies: Negative.   Psychiatric/Behavioral: Negative.     There were no vitals taken for this visit. Physical Exam  Constitutional: She appears well-developed.  HENT:  Head: Normocephalic.  Eyes: Pupils are equal, round, and reactive to light.  Neck: Normal range of motion.  Cardiovascular: Normal rate.   Respiratory: Effort normal.  Neurological: She is alert.  Skin: Skin is warm.  Psychiatric: She has a normal mood and affect.   examination the right arm demonstrates palpable radial pulse EPL FPL interosseous function is intact bruising ecchymosis is present in the right arm deltoid does seem to fire but that's a difficult assessment due to pain no paresthesias  around the shoulder girdle itself  Assessment/Plan Impression is comminuted and displaced proximal humerus fracture plan open reduction internal fixation risks benefits discussed with the patient couldn't limited to infection or vessel damage potential need for more surgery delayed union and malunion.  All questions answered.  [name], MD 01/11/2016, 12:38 PM

## 2016-01-11 NOTE — Op Note (Signed)
NAME:  [name], [name] NO.:  [id number]  MEDICAL RECORD NO.:  [id number]  LOCATION:  [facility]                         FACILITY:  [facility]  PHYSICIAN:  Burnard Bunting, M.D.    DATE OF BIRTH:  15-Dec-1950  DATE OF PROCEDURE: DATE OF DISCHARGE:                              OPERATIVE REPORT   PREOPERATIVE DIAGNOSIS:  Right shoulder four-part proximal humerus fracture.  POSTOPERATIVE DIAGNOSIS:  Right shoulder four-part proximal humerus fracture.  PROCEDURE:  Right shoulder proximal humerus fracture open reduction and internal fixation.  SURGEON:  Burnard Bunting, M.D.  ASSISTANT:  Melvyn Neth, RNFA.  INDICATIONS:  [name] is an active 65 year old patient with right shoulder pain following a fall.  She has a comminuted proximal humerus fracture with four fragments, shaft fragment, two metaphyseal fragments and the head fragment.  She presents now for operative management after explanation of risks and benefits.  PROCEDURE IN DETAIL:  The patient was brought to the operating room where general anesthetic was induced.  Preoperative antibiotics were administered.  Time-out was called.  The patient was placed in the beach- chair position and head in neutral position, elevated about 30 degrees. Right arm, shoulder and hand prescrubbed with alcohol and Betadine, allowed to air dry, prepped with DuraPrep solution and draped in a sterile manner.  Collier Flowers was used to cover the entire operative field. Time-out was called.  Incision made from the coracoid process down to the 4 fingerbreadths proximal to the elbow flexion crease.  Skin and subcutaneous tissue were sharply divided.  Cephalic vein was then mobilized laterally.  Deltopectoral interval was entered.  In general, the patient had significant comminution and had already done a lot of soft tissue stripping on her own.  The two metaphyseal fragments were dissected.  Clot was removed.  Thorough irrigation performed.   Bone edges were identified.  In general, there was fragment, to which the deltoid was attached and a more posterior fragment.  The deltoid fragments spanned the anterior portion of the humeral shaft.  This spanned both the humeral shaft fragment and the head fragment. Significant soft tissue stripping was required in order to mobilize this piece while being able to visualize the posterior piece.  Care was taken to avoid injury to radial nerve posteriorly as these bone fragments were visualized and mobilized.  There was distally in the dissection where the biceps muscle was mobilized medially.  Branches of the muscular cutaneous nerve had to be mobilized and both the medial and lateral to the plate in order to facilitate placement.  The deltoid fragment was then placed spanning the two fracture fragments and secured with 2.7 lag screws.  Once the deltoid fragment was secured to the humeral shaft fragment, this construct was then screwed to the head fragment with one lag screw.  With that in position, the posterior fragment was then secured with a lag screw, which was low profile.  It should be noted that sutures were placed into the deltoid fragment, through drill holes in order to facilitate later reapproximation of the deltoid to the humerus.  At this time, following lag screw fixation with low-profile titanium screws, a plate was applied.  The plate  was applied in the appropriate position.  Biomet plate was then secured first distally with a nonlocking screw and then proximally with a nonlocking screw into the metaphysis.  This gave good plate to bone contact.  Screws were then placed into the head to achieve fixation the lag screws, spanning the metastasis and proximal diaphysis left in place and eight cortices were then achieved distally using nonlocking screws.  Overall, good fixation was achieved.  Correct placement was confirmed in the [facility] and lateral planes under fluoroscopy.   Deltoid was then reattached with the suture to its fragment using FiberWire sutures.  Deltopectoral split was then closed using #1 Vicryl suture followed by interrupted inverted 0 Vicryl suture, 2-0 Vicryl suture and skin staples.  The patient tolerated the procedure well without immediate complications.  Thorough irrigation was performed and then bone grafting was then performed in an area that had a fairly significant defect in the diaphyseal shaft.  This was a Biomet bone putty, which was placed.  Aquacel dressing was placed.  The patient tolerated the procedure well without immediate complications, transferred to the recovery room in stable condition.     Burnard BuntingG. Scott Hyde Sires, M.D.     GSD/MEDQ  D:  01/11/2016  T:  01/11/2016  Job:  [id number]

## 2016-01-11 NOTE — Transfer of Care (Signed)
Immediate Anesthesia Transfer of Care Note  Patient: [name]  Procedure(s) Performed: Procedure(s): OPEN REDUCTION INTERNAL FIXATION (ORIF) RIGHT PROXIMAL HUMERUS FRACTURE (Right)  Patient Location: PACU  Anesthesia Type:General and Regional  Level of Consciousness: awake, alert  and patient cooperative  Airway & Oxygen Therapy: Patient Spontanous Breathing and Patient connected to nasal cannula oxygen  Post-op Assessment: Report given to RN and Post -op Vital signs reviewed and stable  Post vital signs: Reviewed and stable  Last Vitals:  Filed Vitals:   01/11/16 1440  BP: 128/63  Pulse: 87  Temp: 36.9 C  Resp: 18    Last Pain: There were no vitals filed for this visit.    Patients Stated Pain Goal: 6 (01/11/16 1440)  Complications: No apparent anesthesia complications

## 2016-01-11 NOTE — Anesthesia Preprocedure Evaluation (Signed)
Anesthesia Evaluation  Patient identified by MRN, date of birth, ID band Patient awake    Reviewed: Allergy & Precautions, NPO status , Patient's Chart, lab work & pertinent test results  Airway Mallampati: II  TM Distance: >3 FB Neck ROM: Full    Dental  (+) Edentulous Upper, Edentulous Lower   Pulmonary former smoker,    breath sounds clear to auscultation       Cardiovascular  Rhythm:Regular Rate:Normal     Neuro/Psych    GI/Hepatic   Endo/Other    Renal/GU      Musculoskeletal   Abdominal (+) + obese,   Peds  Hematology   Anesthesia Other Findings   Reproductive/Obstetrics                             Anesthesia Physical Anesthesia Plan  ASA: III  Anesthesia Plan: General and Regional   Post-op Pain Management:    Induction: Intravenous  Airway Management Planned: Natural Airway and Simple Face Mask  Additional Equipment:   Intra-op Plan:   Post-operative Plan: Extubation in OR  Informed Consent: I have reviewed the patients History and Physical, chart, labs and discussed the procedure including the risks, benefits and alternatives for the proposed anesthesia with the patient or authorized representative who has indicated his/her understanding and acceptance.     Plan Discussed with: CRNA and Anesthesiologist  Anesthesia Plan Comments:         Anesthesia Quick Evaluation

## 2016-01-11 NOTE — Brief Op Note (Signed)
01/11/2016  10:32 PM  PATIENT:  Audrey FinchBarbara K Marquez  65 y.o. female  PRE-OPERATIVE DIAGNOSIS:  RIGHT PORXIMAL HUMERUS FRACTURE  POST-OPERATIVE DIAGNOSIS:  RIGHT PORXIMAL HUMERUS FRACTURE  PROCEDURE:  Procedure(s): OPEN REDUCTION INTERNAL FIXATION (ORIF) RIGHT PROXIMAL HUMERUS FRACTURE  SURGEON:  Surgeon(s): Cammy CopaScott Gregory Dean, MD  ASSISTANT: Hart CarwinJustin Queen RNFA  ANESTHESIA:   regional and general  EBL: 300 ml    Total I/O In: 2100 [I.V.:2100] Out: 300 [Blood:300]  BLOOD ADMINISTERED: none  DRAINS: none   LOCAL MEDICATIONS USED:  none  SPECIMEN:  No Specimen  COUNTS:  YES  TOURNIQUET:  * No tourniquets in log *  DICTATION: .Other Dictation: Dictation Number [id number]  PLAN OF CARE: Admit for overnight observation  PATIENT DISPOSITION:  PACU - hemodynamically stable

## 2016-01-11 NOTE — Anesthesia Procedure Notes (Addendum)
Anesthesia Regional Block:  Interscalene brachial plexus block  Pre-Anesthetic Checklist: ,, timeout performed, Correct Patient, Correct Site, Correct Laterality, Correct Procedure, Correct Position, site marked, Risks and benefits discussed,  Surgical consent,  Pre-op evaluation,  At surgeon's request and post-op pain management  Laterality: Right  Prep: chloraprep       Needles:  Injection technique: Single-shot  Needle Type: Echogenic Stimulator Needle     Needle Length: 9cm 9 cm Needle Gauge: 21 and 21 G    Additional Needles:  Procedures: ultrasound guided (picture in chart) Interscalene brachial plexus block Narrative:  Start time: 01/11/2016 5:55 PM End time: 01/11/2016 6:00 PM Injection made incrementally with aspirations every 5 mL.  Performed by: Personally   Additional Notes: 30 cc 0.5% Bupivacaine with 1:200 epi injected easily   Procedure Name: Intubation Date/Time: 01/11/2016 6:35 PM Performed by: Fanny DanceMULLINS, Sevrin Sally L Pre-anesthesia Checklist: Patient identified, Emergency Drugs available, Suction available and Patient being monitored Patient Re-evaluated:Patient Re-evaluated prior to inductionOxygen Delivery Method: Circle system utilized Preoxygenation: Pre-oxygenation with 100% oxygen Intubation Type: IV induction Ventilation: Mask ventilation without difficulty Laryngoscope Size: Miller and 2 Grade View: Grade I Tube type: Oral Tube size: 7.0 mm Number of attempts: 1 Airway Equipment and Method: Stylet Placement Confirmation: ETT inserted through vocal cords under direct vision,  positive ETCO2 and breath sounds checked- equal and bilateral Secured at: 21 cm Tube secured with: Tape Dental Injury: Teeth and Oropharynx as per pre-operative assessment

## 2016-01-12 ENCOUNTER — Encounter (HOSPITAL_COMMUNITY): Payer: Self-pay | Admitting: *Deleted

## 2016-01-12 DIAGNOSIS — S42351A Displaced comminuted fracture of shaft of humerus, right arm, initial encounter for closed fracture: Secondary | ICD-10-CM | POA: Diagnosis not present

## 2016-01-12 NOTE — Progress Notes (Signed)
OT Cancellation Note  Patient Details Name: Audrey FinchBarbara K Marti MRN: [id number] DOB: [birth year]   Cancelled Treatment:    Reason Eval/Treat Not Completed:  Pt resting comfortably with pain managed. Had a rough night per daughter. Began educating daughter and left shoulder protocol handout in room for her review.  Will try back later.  Evern BioMayberry, Brooklinn Longbottom Lynn 01/12/2016, 9:06 AM  [phone]

## 2016-01-12 NOTE — Progress Notes (Signed)
Attempted to remove Oxygen and pt stats decrease to 70's on room air O2 was placed back on 3 L nasal cannula and given incentive spirometer she did 1250 encouraged to use often with goal 1550 Ilean SkillVeronica Payne Garske LPN

## 2016-01-12 NOTE — Progress Notes (Signed)
Pt stable - pain present Dressing intact - epl - fpl - io  Rad pulse intact Plan to keep today - change to donjoy sling

## 2016-01-12 NOTE — Anesthesia Postprocedure Evaluation (Signed)
Anesthesia Post Note  Patient: [name]  Procedure(s) Performed: Procedure(s) (LRB): OPEN REDUCTION INTERNAL FIXATION (ORIF) RIGHT PROXIMAL HUMERUS FRACTURE (Right)  Patient location during evaluation: PACU Anesthesia Type: General and Regional Level of consciousness: awake and alert Pain management: pain level controlled Vital Signs Assessment: post-procedure vital signs reviewed and stable Respiratory status: spontaneous breathing, nonlabored ventilation, respiratory function stable and patient connected to nasal cannula oxygen Cardiovascular status: blood pressure returned to baseline and stable Postop Assessment: no signs of nausea or vomiting Anesthetic complications: no    Last Vitals:  Filed Vitals:   01/11/16 2310 01/11/16 2319  BP:  120/63  Pulse: 93 93  Temp:    Resp: 13 9    Last Pain: There were no vitals filed for this visit.               Kennieth RadFitzgerald, Shawanna Zanders E

## 2016-01-12 NOTE — Progress Notes (Signed)
Orthopedic Tech Progress Note Patient Details:  Audrey FinchBarbara K Marquez July 13, 1951 [id number]  Ortho Devices Type of Ortho Device: Shoulder abduction pillow Ortho Device/Splint Location: rue Ortho Device/Splint Interventions: Application   [name] 01/12/2016, 1:17 PM

## 2016-01-12 NOTE — Evaluation (Signed)
Occupational Therapy Evaluation Patient Details Name: Audrey FinchBarbara K Marquez MRN: [id number] DOB: 06/15/1951 Today's Date: 01/12/2016    History of Present Illness s/p ORIF R humerus following a fall with 4 part fx.   Clinical Impression   Pt was independent at her baseline. Presents with post operative pain and generalized weakness interfering with ability to perform ADL and ADL transfers. AROM of R elbow to hand performed seated at EOB, educated in sling use, positioning R UE in bed and chair and compensatory strategies for bathing and dressing. Pt requiring hand held assist to ambulate to the bathroom, managed pericare with set up. Daughter in room entire session. Daughter reports abduction sling ordered at her request as she is familiar with this type of sling, but now deferring its use in favor of immobilizer. Will follow acutely.   Follow Up Recommendations  No OT follow up    Equipment Recommendations  None recommended by OT    Recommendations for Other Services       Precautions / Restrictions Precautions Precautions: Shoulder Type of Shoulder Precautions: conservative protocol Shoulder Interventions: Shoulder sling/immobilizer;Off for dressing/bathing/exercises Precaution Booklet Issued: Yes (comment) Required Braces or Orthoses: Sling Restrictions Weight Bearing Restrictions: Yes RLE Weight Bearing: Non weight bearing      Mobility Bed Mobility Overal bed mobility: Needs Assistance Bed Mobility: Supine to Sit;Sit to Sidelying;Rolling Rolling: Supervision   Supine to sit: Min guard;HOB elevated (use of rail)   Sit to sidelying: Min assist (assist for LEs) General bed mobility comments: instructed in bed mobility toward L side to avoid attempt to push up with R UE  Transfers Overall transfer level: Needs assistance Equipment used: 1 person hand held assist Transfers: Sit to/from Stand Sit to Stand: Min guard              Balance                                             ADL Overall ADL's : Needs assistance/impaired Eating/Feeding: Set up;Sitting   Grooming: Wash/dry hands;Standing;Minimal assistance           Upper Body Dressing : Maximal assistance;Sitting Upper Body Dressing Details (indicate cue type and reason): instructed in compensatory strategies for dressing and donning and doffing sling Lower Body Dressing: Moderate assistance;Sit to/from stand   Toilet Transfer: Minimal assistance;Ambulation;Comfort height toilet;Grab bars   Toileting- Clothing Manipulation and Hygiene: Sit to/from stand;Minimal assistance       Functional mobility during ADLs: Minimal assistance (hand held assist) General ADL Comments: Educated pt and daughter in positioning R shoulder in bed and chair, sling donning and doffing and proper positioning, compensatory strategies for bathing and dressing. Pt with increased pain and decreased activity tolerance, but tolerated removal of sling for elbow to hand AROM exercises.Pt with increased guarding of shoulder further increasing pain.     Vision     Perception     Praxis      Pertinent Vitals/Pain Pain Assessment: 0-10 Pain Score: 7  Pain Location: R shoulder Pain Descriptors / Indicators: Aching;Guarding;Grimacing Pain Intervention(s): Limited activity within patient's tolerance;Monitored during session;Premedicated before session;Repositioned;Ice applied     Hand Dominance Right   Extremity/Trunk Assessment Upper Extremity Assessment Upper Extremity Assessment: RUE deficits/detail RUE Deficits / Details: shoulder immobilized, performed elbow to hand AROM x 5 within pain tolerance RUE: Unable to fully assess due to immobilization RUE Coordination: decreased  gross motor   Lower Extremity Assessment Lower Extremity Assessment: Generalized weakness       Communication Communication Communication: No difficulties   Cognition Arousal/Alertness: Awake/alert Behavior  During Therapy: WFL for tasks assessed/performed Overall Cognitive Status: Within Functional Limits for tasks assessed                     General Comments       Exercises       Shoulder Instructions      Home Living Family/patient expects to be discharged to:: Private residence Living Arrangements: Spouse/significant other;Children Available Help at Discharge: Family;Available 24 hours/day                                    Prior Functioning/Environment Level of Independence: Independent        Comments: pt leads an active lifestyle    OT Diagnosis: Generalized weakness;Acute pain   OT Problem List: Decreased strength;Decreased range of motion;Decreased activity tolerance;Impaired balance (sitting and/or standing);Decreased coordination;Decreased knowledge of use of DME or AE;Decreased knowledge of precautions;Impaired UE functional use;Pain   OT Treatment/Interventions: Self-care/ADL training;Therapeutic exercise;DME and/or AE instruction;Therapeutic activities;Patient/family education    OT Goals(Current goals can be found in the care plan section) Acute Rehab OT Goals Patient Stated Goal: return to previous lifestyle OT Goal Formulation: With patient Time For Goal Achievement: 01/19/16 Potential to Achieve Goals: Good  OT Frequency: Min 2X/week   Barriers to D/C:            Co-evaluation              End of Session Equipment Utilized During Treatment: Gait belt Nurse Communication: Mobility status  Activity Tolerance: Patient limited by pain Patient left: in bed;with call bell/phone within reach;with family/visitor present   Time: 1354-1433 OT Time Calculation (min): 39 min Charges:  OT General Charges $OT Visit: 1 Procedure OT Evaluation $OT Eval Moderate Complexity: 1 Procedure OT Treatments $Self Care/Home Management : 8-22 mins $Therapeutic Exercise: 8-22 mins G-Codes: OT G-codes **NOT FOR INPATIENT CLASS** Functional  Assessment Tool Used: clinical judgement Functional Limitation: Self care Self Care Current Status (Z6109): At least 60 percent but less than 80 percent impaired, limited or restricted Self Care Goal Status (U0454): At least 1 percent but less than 20 percent impaired, limited or restricted  Evern Bio 01/12/2016, 2:44 PM  [phone]

## 2016-01-12 NOTE — Progress Notes (Signed)
Rept to Dr. Dean-pt's pain has been barely tolerable with prescribed pain regimen since shift change this AM. ? If pt's anesthesia block wore off quickly causing the pt to have increased pain. Pt noted to desat when asleep. Pt oxy sat 81% on RA this AM while asleep. Pt has been encouraged to perform IS. Pt verb understanding and agrees to comply. MD in to see pt. No new orders at this time. Will continue to monitor.

## 2016-01-13 DIAGNOSIS — S42351A Displaced comminuted fracture of shaft of humerus, right arm, initial encounter for closed fracture: Secondary | ICD-10-CM | POA: Diagnosis not present

## 2016-01-13 MED ORDER — OXYCODONE HCL 5 MG PO TABS: 5.0000 mg | ORAL_TABLET | ORAL | Status: DC | PRN

## 2016-01-13 MED ORDER — METHOCARBAMOL 500 MG PO TABS: 500.0000 mg | ORAL_TABLET | Freq: Four times a day (QID) | ORAL | Status: DC | PRN

## 2016-01-13 NOTE — Progress Notes (Signed)
Occupational Therapy Treatment Patient Details Name: [name] MRN: [id number] DOB: [birth year] Today's Date: 01/13/2016    History of present illness s/p ORIF R humerus following a fall with 4 part fx.   OT comments  Pt assisted with dressing using compensatory strategies. Performed AROM x 10 L elbow to hand. Educated pt and daughter in sling donning and doffing/proper positioning and edema management. Issued a squeeze ball. Pt with limited elbow movement, performed some passive prolonged stretch with good tolerance. Asked that pt and daughter watch elbow ROM carefully and continue to be faithful to exercises at least 3 times a day. Pt and daughter verbalizing understanding of all information with no further questions.  Follow Up Recommendations  No OT follow up (Follow up with MD Monday.)    Equipment Recommendations  None recommended by OT    Recommendations for Other Services      Precautions / Restrictions Precautions Precautions: Shoulder Type of Shoulder Precautions: conservative protocol Shoulder Interventions: Shoulder sling/immobilizer;Off for dressing/bathing/exercises Required Braces or Orthoses: Sling Restrictions RUE Weight Bearing: Non weight bearing       Mobility Bed Mobility               General bed mobility comments: pt at EOB upon arrival  Transfers Overall transfer level: Needs assistance   Transfers: Sit to/from Stand Sit to Stand: Min guard         General transfer comment: pt with sore knees from fall, increased time to stand, min guard for safety    Balance                                   ADL Overall ADL's : Needs assistance/impaired                 Upper Body Dressing : Moderate assistance;Sitting Upper Body Dressing Details (indicate cue type and reason): instructed in compensatory strategies for dressing and donning and doffing sling Lower Body Dressing: Moderate assistance;Sit to/from stand    Toilet Transfer: Min guard;Ambulation;Comfort height toilet   Toileting- Clothing Manipulation and Hygiene: Min guard;Sit to/from stand       Functional mobility during ADLs: Min guard General ADL Comments: Performed elbow to hand AROM in standing x 10. Instructed in donning shoulder sling and proper position      Vision                     Perception     Praxis      Cognition   Behavior During Therapy: WFL for tasks assessed/performed Overall Cognitive Status: Within Functional Limits for tasks assessed                       Extremity/Trunk Assessment               Exercises     Shoulder Instructions       General Comments      Pertinent Vitals/ Pain       Pain Assessment: Faces Faces Pain Scale: Hurts little more Pain Location: R shoulder, B knees Pain Descriptors / Indicators: Sore;Guarding Pain Intervention(s): Limited activity within patient's tolerance;Monitored during session;Premedicated before session;Repositioned  Home Living  Prior Functioning/Environment              Frequency Min 2X/week     Progress Toward Goals  OT Goals(current goals can now be found in the care plan section)  Progress towards OT goals: Progressing toward goals  Acute Rehab OT Goals Patient Stated Goal: return to previous lifestyle  Plan Discharge plan remains appropriate    Co-evaluation                 End of Session     Activity Tolerance Patient tolerated treatment well   Patient Left in bed;with call bell/phone within reach;with family/visitor present   Nurse Communication  (ready to d/c)        Time: [id number] OT Time Calculation (min): 25 min  Charges: OT General Charges $OT Visit: 1 Procedure OT Treatments $Self Care/Home Management : 8-22 mins $Therapeutic Exercise: 8-22 mins  Evern BioMayberry, Madolin Twaddle Lynn 01/13/2016, 9:36 AM  [phone]

## 2016-01-13 NOTE — Progress Notes (Signed)
Subjective: Pt stable - pain ok  Better today than yesterday   Objective: Vital signs in last 24 hours: Temp:  [98.4 F (36.9 C)-100.2 F (37.9 C)] 98.7 F (37.1 C) (06/21 0500) Pulse Rate:  [95-113] 95 (06/21 0500) Resp:  [16] 16 (06/21 0500) BP: (104-132)/(55-65) 104/55 mmHg (06/21 0500) SpO2:  [93 %-98 %] 98 % (06/21 0500)  Intake/Output from previous day: 06/20 0701 - 06/21 0700 In: 890 [P.O.:240; I.V.:600; IV Piggyback:50] Out: -  Intake/Output this shift:    Exam:  Sensation intact distally Intact pulses distally  Labs:  Recent Labs  01/11/16 1440  HGB 9.2*    Recent Labs  01/11/16 1440  WBC 8.6  RBC 3.11*  HCT 28.7*  PLT 220    Recent Labs  01/11/16 1440  NA 137  K 4.1  CL 105  CO2 21*  BUN 11  CREATININE 0.74  GLUCOSE 94  CALCIUM 9.0   No results for input(s): LABPT, INR in the last 72 hours.  Assessment/Plan: Plan dc today after ot   DEAN,GREGORY SCOTT 01/13/2016, 7:41 AM

## 2016-01-13 NOTE — Progress Notes (Signed)
Pt ready for d/c home per MD. Pt's pain is controlled overnight and today with PRN oxy IR. Cleared by OT. Discharge instructions and prescriptions reviewed with pt and daughter, all questions answered. Belongings sent with daughter.   [place], Latricia HeftKorie G

## 2016-01-15 NOTE — Discharge Summary (Signed)
Physician Discharge Summary  Patient ID: Audrey FinchBarbara K Marquez MRN: [id number] DOB/AGE: 11/19/1950 65 y.o.  Admit date: 01/11/2016 Discharge date: 01/15/2016  Admission Diagnoses:  Active Problems:   Proximal humerus fracture   Discharge Diagnoses:  Same  Surgeries: Procedure(s): OPEN REDUCTION INTERNAL FIXATION (ORIF) RIGHT PROXIMAL HUMERUS FRACTURE on 01/11/2016   Consultants:    Discharged Condition: Stable  Hospital Course: Audrey FinchBarbara K Marquez is an 65 y.o. female who was admitted 01/11/2016 with a chief complaint of right arm pain, and found to have a diagnosis of proximal humerus fracture.  They were brought to the operating room on 01/11/2016 and underwent the above named procedures.She was feeling better by pod 2 . Motor sensory to hand intact post op. OT eval and treatment completed prior to dc.    Antibiotics given:  Anti-infectives    Start     Dose/Rate Route Frequency Ordered Stop   01/12/16 0300  ceFAZolin (ANCEF) IVPB 2g/100 mL premix     2 g 200 mL/hr over 30 Minutes Intravenous Every 8 hours 01/11/16 2348 01/12/16 1744   01/11/16 1200  ceFAZolin (ANCEF) IVPB 2g/100 mL premix     2 g 200 mL/hr over 30 Minutes Intravenous To [facility]Stay [facility] 01/11/16 0814 01/11/16 1828    .  Recent vital signs:  Filed Vitals:   01/12/16 2148 01/13/16 0500  BP: 123/56 104/55  Pulse: 107 95  Temp: 100.2 F (37.9 C) 98.7 F (37.1 C)  Resp: 16 16    Recent laboratory studies:  Results for orders placed or performed during the hospital encounter of 01/11/16  CBC  Result Value Ref Range   WBC 8.6 4.0 - 10.5 K/uL   RBC 3.11 (L) 3.87 - 5.11 MIL/uL   Hemoglobin 9.2 (L) 12.0 - 15.0 g/dL   HCT 78.228.7 (L) 95.636.0 - 21.346.0 %   MCV 92.3 78.0 - 100.0 fL   MCH 29.6 26.0 - 34.0 pg   MCHC 32.1 30.0 - 36.0 g/dL   RDW 08.613.7 57.811.5 - 46.915.5 %   Platelets 220 150 - 400 K/uL  Basic metabolic panel  Result Value Ref Range   Sodium 137 135 - 145 mmol/L   Potassium 4.1 3.5 - 5.1 mmol/L   Chloride 105  101 - 111 mmol/L   CO2 21 (L) 22 - 32 mmol/L   Glucose, Bld 94 65 - 99 mg/dL   BUN 11 6 - 20 mg/dL   Creatinine, Ser 6.290.74 0.44 - 1.00 mg/dL   Calcium 9.0 8.9 - 52.810.3 mg/dL   GFR calc non Af Amer >60 >60 mL/min   GFR calc Af Amer >60 >60 mL/min   Anion gap 11 5 - 15    Discharge Medications:     Medication List    STOP taking these medications        HYDROcodone-acetaminophen 5-325 MG tablet  Commonly known as:  NORCO/VICODIN     oxyCODONE-acetaminophen 5-325 MG tablet  Commonly known as:  PERCOCET/ROXICET      TAKE these medications        ALPRAZolam 1 MG tablet  Commonly known as:  XANAX  Take 1 mg by mouth at bedtime as needed for sleep.     aspirin EC 81 MG tablet  Take 81 mg by mouth daily.     docusate sodium 100 MG capsule  Commonly known as:  COLACE  Take 100 mg by mouth 2 (two) times daily.     methocarbamol 500 MG tablet  Commonly known as:  ROBAXIN  Take  1 tablet (500 mg total) by mouth every 6 (six) hours as needed for muscle spasms.     multivitamin capsule  Take 1 capsule by mouth daily.     oxyCODONE 5 MG immediate release tablet  Commonly known as:  Oxy IR/ROXICODONE  Take 1-2 tablets (5-10 mg total) by mouth every 3 (three) hours as needed for breakthrough pain.     simvastatin 40 MG tablet  Commonly known as:  ZOCOR  Take 40 mg by mouth every evening.        Diagnostic Studies: Dg Shoulder Right  01/07/2016  CLINICAL DATA:  Trip and fall injury, landing on the right arm. Right shoulder and right humerus pain. EXAM: RIGHT SHOULDER - 2+ VIEW COMPARISON:  Right humerus 01/07/2016 FINDINGS: Comminuted fractures are demonstrated in the proximal right humerus. See additional report of right humerus from today's date. Degenerative changes in the glenohumeral joint. No evidence of acute fracture or dislocation of the right shoulder. Visualized clavicle appears intact. IMPRESSION: Comminuted fractures of the proximal right humeral shaft. Degenerative  changes in the right shoulder. Electronically Signed   By: Burman Nieves M.D.   On: 01/07/2016 21:14   Dg Shoulder Right Port  01/11/2016  CLINICAL DATA:  S/p surgery for proximal right humerus fracture. (I did internal and external rotations of right humerus.) EXAM: PORTABLE RIGHT SHOULDER - 2+ VIEW COMPARISON:  01/07/2016 FINDINGS: Patient has undergone plate and screw fixation of the comminuted proximal humeral shaft fracture, fragments projecting in near anatomic alignment. Overlying skin staples. No dislocation. Elbow is incompletely visualized. IMPRESSION: Interval internal fixation of comminuted humeral shaft fracture in near anatomic alignment. Electronically Signed   By: Corlis Leak M.D.   On: 01/11/2016 23:34   Dg Humerus Right  01/11/2016  CLINICAL DATA:  ORIF right humerus fracture. Fluoro time 42 seconds. EXAM: RIGHT HUMERUS - 2+ VIEW; DG C-ARM 61-120 MIN COMPARISON:  01/07/2016 FINDINGS: Four fluoroscopic spot images document plate and screw fixation of the comminuted right humeral shaft fracture, fragments in near anatomic alignment. IMPRESSION: 1. Internal fixation of comminuted humeral fracture. Electronically Signed   By: Corlis Leak M.D.   On: 01/11/2016 23:33   Dg Humerus Right  01/07/2016  CLINICAL DATA:  Tripped and fell and landed on right arm. EXAM: RIGHT HUMERUS - 2+ VIEW COMPARISON:  None. FINDINGS: Comminuted and displaced fracture involving the proximal and mid right humerus. Evidence for a large butterfly fragment. Visualized right ribs are intact. Right glenohumeral joint is probably intact but difficult to evaluate. IMPRESSION: Comminuted and displaced fracture of the right humerus. Electronically Signed   By: Richarda Overlie M.D.   On: 01/07/2016 21:14   Dg C-arm 61-120 Min  01/11/2016  CLINICAL DATA:  ORIF right humerus fracture. Fluoro time 42 seconds. EXAM: RIGHT HUMERUS - 2+ VIEW; DG C-ARM 61-120 MIN COMPARISON:  01/07/2016 FINDINGS: Four fluoroscopic spot images document  plate and screw fixation of the comminuted right humeral shaft fracture, fragments in near anatomic alignment. IMPRESSION: 1. Internal fixation of comminuted humeral fracture. Electronically Signed   By: Corlis Leak M.D.   On: 01/11/2016 23:33    Disposition: 01-Home or Self Care      Discharge Instructions    Call MD / Call 911    Complete by:  As directed   If you experience chest pain or shortness of breath, CALL 911 and be transported to the hospital emergency room.  If you develope a fever above 101 F, pus (white drainage) or increased drainage  or redness at the wound, or calf pain, call your surgeon's office.     Constipation Prevention    Complete by:  As directed   Drink plenty of fluids.  Prune juice may be helpful.  You may use a stool softener, such as Colace (over the counter) 100 mg twice a day.  Use MiraLax (over the counter) for constipation as needed.     Diet - low sodium heart healthy    Complete by:  As directed      Discharge instructions    Complete by:  As directed   Ok to shower with dressing on Ok to be out of sling when resting - use sling when up and around     Increase activity slowly as tolerated    Complete by:  As directed               Signed: Arvetta Araque SCOTT 01/15/2016, 8:14 AM

## 2016-01-18 MED FILL — Oxycodone w/ Acetaminophen Tab 5-325 MG: ORAL | Qty: 6 | Status: AC

## 2016-03-18 ENCOUNTER — Ambulatory Visit (INDEPENDENT_AMBULATORY_CARE_PROVIDER_SITE_OTHER): Payer: Medicare Other

## 2016-03-18 ENCOUNTER — Ambulatory Visit (INDEPENDENT_AMBULATORY_CARE_PROVIDER_SITE_OTHER): Payer: Medicare Other | Admitting: Orthopedic Surgery

## 2016-03-18 ENCOUNTER — Encounter: Payer: Self-pay | Admitting: Orthopedic Surgery

## 2016-03-18 VITALS — BP 128/81 | HR 84 | Ht 65.0 in | Wt 208.0 lb

## 2016-03-18 DIAGNOSIS — M545 Low back pain, unspecified: Secondary | ICD-10-CM

## 2016-03-18 DIAGNOSIS — M25551 Pain in right hip: Secondary | ICD-10-CM | POA: Diagnosis not present

## 2016-03-18 DIAGNOSIS — M5136 Other intervertebral disc degeneration, lumbar region: Secondary | ICD-10-CM

## 2016-03-18 DIAGNOSIS — M25552 Pain in left hip: Secondary | ICD-10-CM

## 2016-03-18 NOTE — Patient Instructions (Signed)
Call to schedule physical therapy for lumbar spine stabilization

## 2016-03-18 NOTE — Progress Notes (Signed)
Patient ID: Audrey FinchBarbara K Marquez, female   DOB: Dec 21, 1950, 65 y.o.   MRN: [id number]  Bilateral hip pain   HPI [name] is a 65 y.o. female.  Presents for evaluation of bilateral hip pain and groin pain  History of lumbar degenerative disc disease status post epidural injection in 2016  Known osteoarthritis bilateral hips  Complains of bilateral hip soreness and right leg giving way  Review of Systems Review of Systems  Constitutional: Positive for fever. Negative for chills.  Gastrointestinal: Negative.   Genitourinary: Negative.   Neurological: Positive for weakness. Negative for numbness.    Past Medical History:  Diagnosis Date  . Anxiety    panic attack  . Arthritis   . Carpal tunnel syndrome on both sides   . Complication of anesthesia    1st knee replacement slow to awaken, 2nd knee replacement - woke up had to have more anesthesia  . High cholesterol   . History of kidney stones   . Knee joint replacement status   . Shortness of breath dyspnea    with exertion    Past Surgical History:  Procedure Laterality Date  . APPENDECTOMY    . BREAST CYST EXCISION    . BUNIONECTOMY  09/29/2011   RIGHT  . CARPAL TUNNEL RELEASE  04/13/2012   rocedure: CARPAL TUNNEL RELEASE;  Surgeon: Audrey HearingStanley E Lusine Corlett, MD;  Location: [facility];  Service: Orthopedics;  Laterality: Right;  . COLONOSCOPY N/A 09/11/2015   Procedure: COLONOSCOPY;  Surgeon: Audrey BaliSandi L Fields, MD;  Location: [facility];  Service: Endoscopy;  Laterality: N/A;  1000  . JOINT REPLACEMENT Bilateral 2008   bilateral knees, [facility]; [name]  . KNEE SURGERY    . LAMINECTOMY    . ORIF HUMERUS FRACTURE Right 01/11/2016   Procedure: OPEN REDUCTION INTERNAL FIXATION (ORIF) RIGHT PROXIMAL HUMERUS FRACTURE;  Surgeon: Audrey CopaScott Gregory Dean, MD;  Location: [facility];  Service: Orthopedics;  Laterality: Right;    Social History Social History  Substance Use Topics  . Smoking status: Former Smoker    Years: 30.00    Quit date:  11/26/2010  . Smokeless tobacco: Never Used  . Alcohol use 1.2 oz/week    2 Glasses of wine per week     Comment: occasional wine    Allergies  Allergen Reactions  . Gabapentin Rash    Current Outpatient Prescriptions  Medication Sig Dispense Refill  . ALPRAZolam (XANAX) 1 MG tablet Take 1 mg by mouth at bedtime as needed for sleep.     Marland Kitchen. aspirin EC 81 MG tablet Take 81 mg by mouth daily.    Marland Kitchen. docusate sodium (COLACE) 100 MG capsule Take 100 mg by mouth 2 (two) times daily.    . methocarbamol (ROBAXIN) 500 MG tablet Take 1 tablet (500 mg total) by mouth every 6 (six) hours as needed for muscle spasms. 30 tablet 0  . Multiple Vitamin (MULTIVITAMIN) capsule Take 1 capsule by mouth daily.    Marland Kitchen. oxyCODONE (OXY IR/ROXICODONE) 5 MG immediate release tablet Take 1-2 tablets (5-10 mg total) by mouth every 3 (three) hours as needed for breakthrough pain. 60 tablet 0  . simvastatin (ZOCOR) 40 MG tablet Take 40 mg by mouth every evening.     No current facility-administered medications for this visit.       Physical Exam  Physical Exam   BP 128/81   Pulse 84   Ht 5\' 5"  (1.651 m)   Wt 208 lb (94.3 kg)   BMI 34.61 kg/m  2Gen. appearance Normal grooming normal hygiene 3The patient is alert and oriented person place and time is normal affect is normal 5Ambulatory status normal gait  Exam of the right and left hip  6Inspection no groin tenderness 7ROM right hip flexion 110 left hip flexion 100. Right hip internal rotation 30 left hip 10 8Stability both hips stable 9Strength right foot strength dorsiflexion plantar flexion normal hip flexion normal right and left leg normal the right leg normal  10Skin: Right and left leg normal  11Pulses: No peripheral edema in either leg  12 Neuro: Legs normal in each leg   Data Reviewed X-ray pelvis and L-spine show marginal osteophytes in the lumbar spine slight coronal and sagittal plane malalignment. Disc spaces preserved up until  L5-S1 which show some narrowing on the [facility] x-ray Pelvic x-ray shows osteoarthritis more severe on the left less severe on the right no progression from previous x-rays Assessment    Encounter Diagnoses  Name Primary?  . Bilateral hip pain   . Bilateral low back pain without sciatica Yes  . DDD (degenerative disc disease), lumbar        Plan    Physical therapy follow-up    3 months   [name] 03/18/2016, 8:50 AM

## 2016-03-23 ENCOUNTER — Ambulatory Visit (HOSPITAL_COMMUNITY): Payer: Medicare Other | Attending: Orthopedic Surgery | Admitting: Physical Therapy

## 2016-03-23 DIAGNOSIS — R262 Difficulty in walking, not elsewhere classified: Secondary | ICD-10-CM | POA: Insufficient documentation

## 2016-03-23 DIAGNOSIS — M545 Low back pain, unspecified: Secondary | ICD-10-CM

## 2016-03-23 DIAGNOSIS — M6281 Muscle weakness (generalized): Secondary | ICD-10-CM | POA: Insufficient documentation

## 2016-03-23 NOTE — Therapy (Signed)
[facility] [address] , [place], [address] Phone: [phone]   Fax:  [phone]  Physical Therapy Evaluation  Patient Details  Name: Audrey FinchBarbara K Marquez MRN: [id number] Date of Birth: [birth year] Referring Provider: Fuller CanadaStanley Harrison   Encounter Date: 03/23/2016      PT End of Session - 03/23/16 1212    Visit Number 1   Number of Visits 12   Date for PT Re-Evaluation 05/04/16   Authorization Type BCBS medicare   Authorization - Visit Number 1   Authorization - Number of Visits 10   PT Start Time 1030   PT Stop Time 1115   PT Time Calculation (min) 45 min   Activity Tolerance Patient tolerated treatment well   Behavior [facility] for tasks assessed/performed      Past Medical History:  Diagnosis Date  . Anxiety    panic attack  . Arthritis   . Carpal tunnel syndrome on both sides   . Complication of anesthesia    1st knee replacement slow to awaken, 2nd knee replacement - woke up had to have more anesthesia  . High cholesterol   . History of kidney stones   . Knee joint replacement status   . Shortness of breath dyspnea    with exertion    Past Surgical History:  Procedure Laterality Date  . APPENDECTOMY    . BREAST CYST EXCISION    . BUNIONECTOMY  09/29/2011   RIGHT  . CARPAL TUNNEL RELEASE  04/13/2012   rocedure: CARPAL TUNNEL RELEASE;  Surgeon: Vickki HearingStanley E Harrison, MD;  Location: [facility];  Service: Orthopedics;  Laterality: Right;  . COLONOSCOPY N/A 09/11/2015   Procedure: COLONOSCOPY;  Surgeon: West BaliSandi L Fields, MD;  Location: [facility];  Service: Endoscopy;  Laterality: N/A;  1000  . JOINT REPLACEMENT Bilateral 2008   bilateral knees, [facility]; Harrison  . KNEE SURGERY    . LAMINECTOMY    . ORIF HUMERUS FRACTURE Right 01/11/2016   Procedure: OPEN REDUCTION INTERNAL FIXATION (ORIF) RIGHT PROXIMAL HUMERUS FRACTURE;  Surgeon: Cammy CopaScott Gregory Dean, MD;  Location: [facility] OR;  Service: Orthopedics;  Laterality: Right;     There were no vitals filed for this visit.       Subjective Assessment - 03/23/16 1032    Subjective [name] states that she has been referred to therapy due to her back pain.  She states that she has had problems on and off for several months in her hips after several falls.   She thought that she was having difficulty in her hips, stating that  her hips, "slipped", but the MD stated that her problems was coming from her back.  She had not had back pain as such until this weekend when she was pulling hose on and felt her back pull.  Now she has pain in both sides of her back and is having difficulty going from sit to stand.  She got out of therapy for a Rt humerus fx last week.    Pertinent History Fx Rt humerus, B TKR; cervical pain.    Limitations Lifting;Walking;Standing;House hold activities   How long can you sit comfortably? becomes uncomfortable after 30'    How long can you stand comfortably? uncomfortable wants to sit down after 10-minutes    How long can you walk comfortably? Pt walks slowly as to not irritate her back    Patient Stated Goals less pain to be able walk and stand longer.   Currently  in Pain? Yes   Pain Score 5   highest 8/10; lowest 5/10    Pain Location Back   Pain Orientation Lower   Pain Descriptors / Indicators Aching;Throbbing   Pain Onset 1 to 4 weeks ago   Pain Frequency Constant   Aggravating Factors  standing, sitting             [facility] PT Assessment - 03/23/16 0001      Assessment   Medical Diagnosis Lumbar DDD   Referring Provider [name]    Onset Date/Surgical Date 02/23/16   Next MD Visit not scheduled    Prior Therapy OT for arm, for TKR and hip therapy      Precautions   Precautions Fall   Precaution Comments MD requst core strengthening: no squats , SLR, QS or TKE      Restrictions   Weight Bearing Restrictions No     Balance Screen   Has the patient fallen in the past 6 months Yes   How many times? 1  Fell two  times in December.    Has the patient had a decrease in activity level because of a fear of falling?  Yes   Is the patient reluctant to leave their home because of a fear of falling?  No     Home Environment   Living Environment Private residence   Type of Home House   Home Access Stairs to enter     Prior Function   Level of Independence Independent   Vocation Retired   Leisure play with grandchildren, walking ,      Observation/Other Assessments   Focus on Therapeutic Outcomes (FOTO)  47     Functional Tests   Functional tests Single leg stance;Sit to Stand     Single Leg Stance   Comments Rt 21 seconds; LT 28 seconds      Sit to Stand   Comments 5 x 23.98     Posture/Postural Control   Posture/Postural Control Postural limitations   Postural Limitations Rounded Shoulders;Increased lumbar lordosis;Increased thoracic kyphosis     ROM / Strength   AROM / PROM / Strength AROM;Strength     AROM   AROM Assessment Site Lumbar     Strength   Strength Assessment Site Hip;Knee;Ankle   Right/Left Hip Right;Left   Right Hip Flexion 4/5   Right Hip Extension 3-/5   Right Hip ABduction 4/5   Left Hip Flexion 5/5   Left Hip Extension 3/5   Left Hip ABduction 5/5   Right/Left Knee Right;Left   Right Knee Flexion 5/5   Right Knee Extension 5/5   Left Knee Flexion 5/5   Left Knee Extension 5/5   Right/Left Ankle Right;Left   Right Ankle Dorsiflexion 5/5   Right Ankle Plantar Flexion 5/5   Left Ankle Dorsiflexion 4+/5   Left Ankle Plantar Flexion 5/5     Palpation   Palpation comment tight paraspinal mm                    [facility] Adult PT Treatment/Exercise - 03/23/16 0001      Exercises   Exercises Lumbar     Lumbar Exercises: Stretches   Single Knee to Chest Stretch 3 reps;30 seconds   Lower Trunk Rotation 5 reps   Pelvic Tilt 5 reps     Lumbar Exercises: Supine   Ab Set 5 reps                PT Education -  03/23/16 1111    Education  provided Yes   Education Details HEP   Person(s) Educated Patient   Methods Explanation;Handout   Comprehension Verbalized understanding;Returned demonstration          PT Short Term Goals - 03/23/16 1227      PT SHORT TERM GOAL #1   Title PT core musculature to increase in strength 1/2 grade to allow pt to be able to complete bed mobility without difficulty.    Time 3   Period Weeks   Status New     PT SHORT TERM GOAL #2   Title Pt back pain to be no greater than a 5/10 to allow pt to be able to walk for 15 to 20 minutes.   Time 3   Period Weeks   Status New     PT SHORT TERM GOAL #3   Title Pt to be able to ride in a car for an hour to travel without increased low back pain    Time 3   Period Weeks           PT Long Term Goals - 03/23/16 1233      PT LONG TERM GOAL #1   Title Pt back pain to be no greater than a 2/10 to allow pt to walk for 30 mintues    Time 6   Period Weeks   Status New     PT LONG TERM GOAL #2   Title Pt core musculature to be increased by one grade to allow pt to be able to go up and down 10 steps without increased low back pain    Time 6   Period Weeks   Status New     PT LONG TERM GOAL #3   Title Pt to be able to carry groceries into her home without increased low back pain    Time 6   Period Weeks     PT LONG TERM GOAL #4   Title Pt to be knowledable and be using proper body mechanics for lifting and pushing to reduce stress on low back    Time 6   Period Weeks               Plan - 03/23/16 1213    Clinical Impression Statement Ms. Williams Che is a 65 yo female who has had surgery on her back in the past.  She has recently felt like her hips were "slipping" causing her to feel as if she was going to fall.  She went to the MD who took stated that her pain and instability had more to do with her back than her knees and referred her to skilled physical therapy.  Ms. 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   She also has recently been discharged from therapy for a recent Rt humeral fx.  Examination demonstrates postural dysfunction, decreased core and  gluteal strength, increased pain  as well as difficulty walking.  Ms. Millikin will benefit from skilled physical therapy to address these issues and maximize her functional activity level.     Rehab Potential Good   PT Frequency 2x / week   PT Duration 6 weeks   PT Treatment/Interventions ADLs/Self Care Home Management;Moist Heat;Gait training;Stair training;Therapeutic activities;Therapeutic exercise;Balance training;Patient/family education;Manual techniques;Ultrasound   PT Next Visit Plan Begin decompression exercies 1-5; bridges and bent knee raise progress to higher level stabilization as able    PT Home Exercise Plan Ab set, LTR, pelvic tilt  Consulted and Agree with Plan of Care Patient      Patient will benefit from skilled therapeutic intervention in order to improve the following deficits and impairments:  Decreased activity tolerance, Decreased balance, Decreased strength, Difficulty walking, Postural dysfunction, Pain, Decreased mobility  Visit Diagnosis: Bilateral low back pain without sciatica - Plan: PT plan of care cert/re-cert  Muscle weakness (generalized) - Plan: PT plan of care cert/re-cert  Difficulty in walking, not elsewhere classified - Plan: PT plan of care cert/re-cert      G-Codes - 04-03-16 1236    Functional Assessment Tool Used foto    Functional Limitation Other PT primary   Other PT Primary Current Status (O1308) At least 60 percent but less than 80 percent impaired, limited or restricted   Other PT Primary Goal Status (M5784) At least 40 percent but less than 60 percent impaired, limited or restricted       Problem List Patient Active Problem List   Diagnosis Date Noted  . Proximal humerus fracture 01/11/2016  . Heme + stool   . Heme positive stool 08/27/2015  . Bilateral hip pain 10/14/2013  . Status  post right knee replacement 10/10/2013  . Status post left knee replacement 10/10/2013  . Degenerative arthritis of knee, bilateral 10/10/2013  . Degenerative arthritis of hip 10/10/2013  . S/P carpal tunnel release 05/22/2012  . CTS (carpal tunnel syndrome) 02/29/2012  . S/P total knee replacement 08/10/2011  . TOTAL KNEE FOLLOW-UP 08/04/2010  . IMPINGEMENT SYNDROME 10/11/2007  . KNEE PAIN 05/31/2007  . OSTEOARTHRITIS, LOWER LEG 05/03/2007    Virgina Organ, PT CLT [phone] 04/03/16, 12:40 PM  [facility] [address] [place], [place], [address] Phone: [phone]   Fax:  [phone]  Name: KAPRICE KAGE MRN: [id number] Date of Birth: 04/21/1951

## 2016-03-23 NOTE — Patient Instructions (Addendum)
Knee-to-Chest Stretch: Unilateral    With hand behind right knee, pull knee in to chest until a comfortable stretch is felt in lower back and buttocks. Keep back relaxed. Hold _30___ seconds. Repeat __3__ times per set. Do __1__ sets per session. Do _2___ sessions per day.  http://orth.exer.us/126   Copyright  VHI. All rights reserved.  Pelvic Tilt    Flatten back by tightening stomach muscles and buttocks. Repeat __10__ times per set. Do _1___ sets per session. Do __2__ sessions per day.  http://orth.exer.us/134   Copyright  VHI. All rights reserved.  Isometric Abdominal    Lying on back with knees bent, tighten stomach by pressing elbows down. Hold __5__ seconds. Repeat _10___ times per set. Do __1__ sets per session. Do ___2_ sessions per day.  http://orth.exer.us/1086   Copyright  VHI. All rights reserved.  Lumbar Rotation (Non-Weight Bearing)    Feet on floor, slowly rock knees from side to side in small, pain-free range of motion. Allow lower back to rotate slightly. Repeat __10__ times per set. Do __1__ sets per session. Do __2__ sessions per day.  http://orth.exer.us/160   Copyright  VHI. All rights reserved.

## 2016-03-30 ENCOUNTER — Ambulatory Visit (HOSPITAL_COMMUNITY): Payer: Medicare Other | Attending: Orthopedic Surgery

## 2016-03-30 DIAGNOSIS — R262 Difficulty in walking, not elsewhere classified: Secondary | ICD-10-CM

## 2016-03-30 DIAGNOSIS — M545 Low back pain, unspecified: Secondary | ICD-10-CM

## 2016-03-30 DIAGNOSIS — M6281 Muscle weakness (generalized): Secondary | ICD-10-CM | POA: Diagnosis present

## 2016-03-30 NOTE — Therapy (Signed)
[facility] [address] [place], [place], [address] Phone: [phone]   Fax:  [phone]  Physical Therapy Treatment  Patient Details  Name: [name] MRN: [id number] Date of Birth: 1951/07/12 Referring Provider: [name]   Encounter Date: 03/30/2016      PT End of Session - 03/30/16 1132    Visit Number 2   Number of Visits 12   Date for PT Re-Evaluation 05/04/16   Authorization Type [employer] medicare   Authorization - Visit Number 2   Authorization - Number of Visits 10   PT Start Time 1121   PT Stop Time 1207   PT Time Calculation (min) 46 min   Activity Tolerance Patient tolerated treatment well   Behavior During Therapy [facility] for tasks assessed/performed      Past Medical History:  Diagnosis Date  . Anxiety    panic attack  . Arthritis   . Carpal tunnel syndrome on both sides   . Complication of anesthesia    1st knee replacement slow to awaken, 2nd knee replacement - woke up had to have more anesthesia  . High cholesterol   . History of kidney stones   . Knee joint replacement status   . Shortness of breath dyspnea    with exertion    Past Surgical History:  Procedure Laterality Date  . APPENDECTOMY    . BREAST CYST EXCISION    . BUNIONECTOMY  09/29/2011   RIGHT  . CARPAL TUNNEL RELEASE  04/13/2012   rocedure: CARPAL TUNNEL RELEASE;  Surgeon: Vickki Hearing, MD;  Location: [facility];  Service: Orthopedics;  Laterality: Right;  . COLONOSCOPY N/A 09/11/2015   Procedure: COLONOSCOPY;  Surgeon: West Bali, MD;  Location: [facility];  Service: Endoscopy;  Laterality: N/A;  1000  . JOINT REPLACEMENT Bilateral 2008   bilateral knees, [facility]; Harrison  . KNEE SURGERY    . LAMINECTOMY    . ORIF HUMERUS FRACTURE Right 01/11/2016   Procedure: OPEN REDUCTION INTERNAL FIXATION (ORIF) RIGHT PROXIMAL HUMERUS FRACTURE;  Surgeon: [name], MD;  Location: [facility] OR;  Service: Orthopedics;  Laterality: Right;     There were no vitals filed for this visit.      Subjective Assessment - 03/30/16 1123    Subjective Pt reports her lower back and Rt hip/thigh feels as though she might fall, current pain scale 6/10 sore to the bone in Rt and discomfort lower back.  No reports of falls since June where she broke arm, has had 2 close falls while shopping.  Reports compliance with HEP everyday   Pertinent History Fx Rt humerus, B TKR; cervical pain.    Patient Stated Goals less pain to be able walk and stand longer.   Currently in Pain? Yes   Pain Score 6    Pain Location Leg   Pain Orientation Right;Proximal   Pain Descriptors / Indicators Sore   Pain Type Chronic pain   Pain Onset 1 to 4 weeks ago   Pain Frequency Constant   Aggravating Factors  standing, sitting   Pain Relieving Factors laying on Rt side   Effect of Pain on Daily Activities slows down, decreased ADLs                         [facility] Adult PT Treatment/Exercise - 03/30/16 0001      Bed Mobility   Bed Mobility Sit to Sidelying Left   Sit to  Sidelying Left 5: Supervision   Sit to Sidelying Left Details (indicate cue type and reason) reviewed bed mobility     Lumbar Exercises: Stretches   Active Hamstring Stretch 2 reps;30 seconds   Active Hamstring Stretch Limitations supine with rope   Single Knee to Chest Stretch 3 reps;30 seconds   Single Knee to Chest Stretch Limitations with towel assistance   Lower Trunk Rotation 5 reps;10 seconds   Pelvic Tilt 5 reps     Lumbar Exercises: Supine   Ab Set 10 reps;5 seconds   Bent Knee Raise 5 reps;5 seconds   Bridge 10 reps   Other Supine Lumbar Exercises Decompression exercise 1-5                PT Education - 03/30/16 1259    Education provided Yes   Education Details Reviewed goals, compliance and assured correct technique with HEP, copy of eval given to pt   Person(s) Educated Patient   Methods Explanation;Demonstration;Handout   Comprehension  Verbalized understanding;Returned demonstration          PT Short Term Goals - 03/23/16 1227      PT SHORT TERM GOAL #1   Title PT core musculature to increase in strength 1/2 grade to allow pt to be able to complete bed mobility without difficulty.    Time 3   Period Weeks   Status New     PT SHORT TERM GOAL #2   Title Pt back pain to be no greater than a 5/10 to allow pt to be able to walk for 15 to 20 minutes.   Time 3   Period Weeks   Status New     PT SHORT TERM GOAL #3   Title Pt to be able to ride in a car for an hour to travel without increased low back pain    Time 3   Period Weeks           PT Long Term Goals - 03/23/16 1233      PT LONG TERM GOAL #1   Title Pt back pain to be no greater than a 2/10 to allow pt to walk for 30 mintues    Time 6   Period Weeks   Status New     PT LONG TERM GOAL #2   Title Pt core musculature to be increased by one grade to allow pt to be able to go up and down 10 steps without increased low back pain    Time 6   Period Weeks   Status New     PT LONG TERM GOAL #3   Title Pt to be able to carry groceries into her home without increased low back pain    Time 6   Period Weeks     PT LONG TERM GOAL #4   Title Pt to be knowledable and be using proper body mechanics for lifting and pushing to reduce stress on low back    Time 6   Period Weeks               Plan - 03/30/16 1135    Clinical Impression Statement Reviewed goals, compliance and assure proper form and technqiues with HEP and copy of eval given to pt.  Began decompression exercises to improve postural strengthening and increase tolerance with supine position as well as core and proximal musculature strengthening exercises.  Pt able to demonstrate appropriate form with therapist facilitation to assure correct techniques and stability with therex.  No  reports of increased pain through session, pt did c/o hamstring cramps during bridges, added hamstring stretch  with reports of cramping resolved .     Rehab Potential Good   PT Frequency 2x / week   PT Duration 6 weeks   PT Treatment/Interventions ADLs/Self Care Home Management;Moist Heat;Gait training;Stair training;Therapeutic activities;Therapeutic exercise;Balance training;Patient/family education;Manual techniques;Ultrasound   PT Next Visit Plan Next session begin decompression with theraband; continue core and proximal musculature strengthening and progress to higher level stabilization as able    PT Home Exercise Plan Initial eval:  Ab set, LTR, pelvic tilt; 03/30/2016: Decompression exercises 1-5      Patient will benefit from skilled therapeutic intervention in order to improve the following deficits and impairments:  Decreased activity tolerance, Decreased balance, Decreased strength, Difficulty walking, Postural dysfunction, Pain, Decreased mobility  Visit Diagnosis: Bilateral low back pain without sciatica  Muscle weakness (generalized)  Difficulty in walking, not elsewhere classified     Problem List Patient Active Problem List   Diagnosis Date Noted  . Proximal humerus fracture 01/11/2016  . Heme + stool   . Heme positive stool 08/27/2015  . Bilateral hip pain 10/14/2013  . Status post right knee replacement 10/10/2013  . Status post left knee replacement 10/10/2013  . Degenerative arthritis of knee, bilateral 10/10/2013  . Degenerative arthritis of hip 10/10/2013  . S/P carpal tunnel release 05/22/2012  . CTS (carpal tunnel syndrome) 02/29/2012  . S/P total knee replacement 08/10/2011  . TOTAL KNEE FOLLOW-UP 08/04/2010  . IMPINGEMENT SYNDROME 10/11/2007  . KNEE PAIN 05/31/2007  . OSTEOARTHRITIS, LOWER LEG 05/03/2007   Becky Saxasey Sheily Lineman, LPTA; CBIS [phone]  Juel BurrowCockerham, Laurie Penado Jo 03/30/2016, 12:59 PM  [facility] [address] [place], [place], [address] Phone: [phone]   Fax:  [phone]  Name: Suszanne FinchBarbara K  Griffing MRN: [id number] Date of Birth: 1951-04-03

## 2016-04-06 ENCOUNTER — Ambulatory Visit (HOSPITAL_COMMUNITY): Payer: Medicare Other | Admitting: Physical Therapy

## 2016-04-06 DIAGNOSIS — M545 Low back pain, unspecified: Secondary | ICD-10-CM

## 2016-04-06 DIAGNOSIS — M6281 Muscle weakness (generalized): Secondary | ICD-10-CM

## 2016-04-06 DIAGNOSIS — R262 Difficulty in walking, not elsewhere classified: Secondary | ICD-10-CM

## 2016-04-06 NOTE — Patient Instructions (Signed)
Getting Into / Out of Bed    Lower self to lie down on one side by raising legs and lowering head at the same time. Use arms to assist moving without twisting. Bend both knees to roll onto back if desired. To sit up, start from lying on side, and use same move-ments in reverse. Keep trunk aligned with legs.   Copyright  VHI. All rights reserved.  Log Roll    Lying on back, bend left knee and place left arm across chest. Roll all in one movement to the right. Reverse to roll to the left. Always move as one unit.   Copyright  VHI. All rights reserved.  Lifting Principles  .Maintain proper posture and head alignment. .Slide object as close as possible before lifting. .Move obstacles out of the way. .Test before lifting; ask for help if too heavy. .Tighten stomach muscles without holding breath. .Use smooth movements; do not jerk. .Use legs to do the work, and pivot with feet. .Distribute the work load symmetrically and close to the center of trunk. .Push instead of pull whenever possible.  Copyright  VHI. All rights reserved.  Deep Squat    Squat and lift with both arms held against upper trunk. Tighten stomach muscles without holding breath. Use smooth movements to avoid jerking.  Copyright  VHI. All rights reserved.  Brushing Teeth     Place one foot on ledge and one hand on counter. Bend other knee slightly to keep back straight.  Copyright  VHI. All rights reserved.  Housework - Sink    Place one foot on ledge of cabinet under sink when standing at sink for prolonged periods.   Copyright  VHI. All rights reserved.

## 2016-04-06 NOTE — Therapy (Signed)
[facility] [address] [place], [place], [address] Phone: [phone]   Fax:  [phone]  Physical Therapy Treatment  Patient Details  Name: [name] MRN: [id number] Date of Birth: [birth year] Referring Provider: [name]   Encounter Date: 04/06/2016      PT End of Session - 04/06/16 0954    Visit Number 3   Number of Visits 12   Date for PT Re-Evaluation 05/04/16   Authorization Type BC[employer] medicare   Authorization - Visit Number 3   Authorization - Number of Visits 10   PT Start Time 0905   PT Stop Time 0953   PT Time Calculation (min) 48 min   Activity Tolerance Patient tolerated treatment well   Behavior During Therapy [facility] for tasks assessed/performed      Past Medical History:  Diagnosis Date  . Anxiety    panic attack  . Arthritis   . Carpal tunnel syndrome on both sides   . Complication of anesthesia    1st knee replacement slow to awaken, 2nd knee replacement - woke up had to have more anesthesia  . High cholesterol   . History of kidney stones   . Knee joint replacement status   . Shortness of breath dyspnea    with exertion    Past Surgical History:  Procedure Laterality Date  . APPENDECTOMY    . BREAST CYST EXCISION    . BUNIONECTOMY  09/29/2011   RIGHT  . CARPAL TUNNEL RELEASE  04/13/2012   rocedure: CARPAL TUNNEL RELEASE;  Surgeon: Vickki Hearing, MD;  Location: [facility];  Service: Orthopedics;  Laterality: Right;  . COLONOSCOPY N/A 09/11/2015   Procedure: COLONOSCOPY;  Surgeon: West Bali, MD;  Location: [facility];  Service: Endoscopy;  Laterality: N/A;  1000  . JOINT REPLACEMENT Bilateral 2008   bilateral knees, [facility]; [place]  . KNEE SURGERY    . LAMINECTOMY    . ORIF HUMERUS FRACTURE Right 01/11/2016   Procedure: OPEN REDUCTION INTERNAL FIXATION (ORIF) RIGHT PROXIMAL HUMERUS FRACTURE;  Surgeon: [name], MD;  Location: [facility] OR;  Service: Orthopedics;  Laterality: Right;     There were no vitals filed for this visit.      Subjective Assessment - 04/06/16 0910    Subjective Pt states that she is no having any pain at this time.  States that the pain is just one of those things that will just come out of nowhere and hit her.     Pertinent History Fx Rt humerus, B TKR; cervical pain.    Patient Stated Goals less pain to be able walk and stand longer.   Currently in Pain? No/denies   Pain Onset 1 to 4 weeks ago                         [facility] Adult PT Treatment/Exercise - 04/06/16 0001      Lumbar Exercises: Stretches   Pelvic Tilt 5 reps  2     Lumbar Exercises: Standing   Other Standing Lumbar Exercises 3-D hip excursion      Lumbar Exercises: Supine   Bent Knee Raise 10 reps   Bridge 10 reps   Other Supine Lumbar Exercises Decompression exercise 1-5   Other Supine Lumbar Exercises t-Band decompression with t-band ( red)     Manual Therapy   Manual Therapy Soft tissue mobilization   Manual therapy comments Rt hip ; paraspinal mm  Soft tissue mobilization musculature                PT Education - 04/06/16 0953    Education provided Yes   Education Details The importance of proper posture and body mechanics as well as t-band decompression exercises    Person(s) Educated Patient   Methods Explanation          PT Short Term Goals - 04/06/16 0957      PT SHORT TERM GOAL #1   Title PT core musculature to increase in strength 1/2 grade to allow pt to be able to complete bed mobility without difficulty.    Time 3   Period Weeks   Status On-going     PT SHORT TERM GOAL #2   Title Pt back pain to be no greater than a 5/10 to allow pt to be able to walk for 15 to 20 minutes.   Time 3   Period Weeks   Status On-going     PT SHORT TERM GOAL #3   Title Pt to be able to ride in a car for an hour to travel without increased low back pain    Time 3   Period Weeks   Status On-going           PT Long Term  Goals - 04/06/16 0957      PT LONG TERM GOAL #1   Title Pt back pain to be no greater than a 2/10 to allow pt to walk for 30 mintues    Time 6   Period Weeks   Status On-going     PT LONG TERM GOAL #2   Title Pt core musculature to be increased by one grade to allow pt to be able to go up and down 10 steps without increased low back pain    Time 6   Period Weeks   Status On-going     PT LONG TERM GOAL #3   Title Pt to be able to carry groceries into her home without increased low back pain    Time 6   Period Weeks   Status On-going     PT LONG TERM GOAL #4   Title Pt to be knowledable and be using proper body mechanics for lifting and pushing to reduce stress on low back    Time 6   Period Weeks   Status On-going               Plan - 04/06/16 0955    Clinical Impression Statement Stressed the importance of completing her exercises as well as proper posture.  Pt instructed in new decompression exercises with good results.  Pt hip and paraspinal mm are extremely tight therefore manual completed at the end of exercises    Rehab Potential Good   PT Frequency 2x / week   PT Duration 6 weeks   PT Treatment/Interventions ADLs/Self Care Home Management;Moist Heat;Gait training;Stair training;Therapeutic activities;Therapeutic exercise;Balance training;Patient/family education;Manual techniques;Ultrasound   PT Next Visit Plan  progress to higher level stabilization as able    PT Home Exercise Plan Initial eval:  Ab set, LTR, pelvic tilt; 03/30/2016: Decompression exercises 1-5; t-band decompression exercises; body mechnics       Patient will benefit from skilled therapeutic intervention in order to improve the following deficits and impairments:  Decreased activity tolerance, Decreased balance, Decreased strength, Difficulty walking, Postural dysfunction, Pain, Decreased mobility  Visit Diagnosis: Bilateral low back pain without sciatica  Muscle weakness  (generalized)  Difficulty  in walking, not elsewhere classified     Problem List Patient Active Problem List   Diagnosis Date Noted  . Proximal humerus fracture 01/11/2016  . Heme + stool   . Heme positive stool 08/27/2015  . Bilateral hip pain 10/14/2013  . Status post right knee replacement 10/10/2013  . Status post left knee replacement 10/10/2013  . Degenerative arthritis of knee, bilateral 10/10/2013  . Degenerative arthritis of hip 10/10/2013  . S/P carpal tunnel release 05/22/2012  . CTS (carpal tunnel syndrome) 02/29/2012  . S/P total knee replacement 08/10/2011  . TOTAL KNEE FOLLOW-UP 08/04/2010  . IMPINGEMENT SYNDROME 10/11/2007  . KNEE PAIN 05/31/2007  . OSTEOARTHRITIS, LOWER LEG 05/03/2007    Virgina Organynthia Russell, PT CLT [phone] 04/06/2016, 9:58 AM  [facility] [address] [place], [place], [address] Phone: [phone]   Fax:  [phone]  Name: [name] MRN: [id number] Date of Birth: 02/15/1951

## 2016-04-11 ENCOUNTER — Ambulatory Visit (HOSPITAL_COMMUNITY): Payer: Medicare Other

## 2016-04-11 DIAGNOSIS — M545 Low back pain, unspecified: Secondary | ICD-10-CM

## 2016-04-11 DIAGNOSIS — M6281 Muscle weakness (generalized): Secondary | ICD-10-CM

## 2016-04-11 DIAGNOSIS — R262 Difficulty in walking, not elsewhere classified: Secondary | ICD-10-CM

## 2016-04-11 NOTE — Therapy (Signed)
[name] [address] [place], [place], [address] Phone: [phone]   Fax:  [phone]  Physical Therapy Treatment  Patient Details  Name: Audrey FinchBarbara K Marquez MRN: [id number] Date of Birth: [birth year] Referring Provider: Fuller CanadaStanley Marquez   Encounter Date: 04/11/2016      PT End of Session - 04/11/16 1009    Visit Number 4   Number of Visits 12   Date for PT Re-Evaluation 05/04/16   Authorization Type BCBS medicare   Authorization - Visit Number 4   Authorization - Number of Visits 10   Activity Tolerance Patient tolerated treatment well;Patient limited by fatigue   Behavior During Therapy [facility] for tasks assessed/performed      Past Medical History:  Diagnosis Date  . Anxiety    panic attack  . Arthritis   . Carpal tunnel syndrome on both sides   . Complication of anesthesia    1st knee replacement slow to awaken, 2nd knee replacement - woke up had to have more anesthesia  . High cholesterol   . History of kidney stones   . Knee joint replacement status   . Shortness of breath dyspnea    with exertion    Past Surgical History:  Procedure Laterality Date  . APPENDECTOMY    . BREAST CYST EXCISION    . BUNIONECTOMY  09/29/2011   RIGHT  . CARPAL TUNNEL RELEASE  04/13/2012   rocedure: CARPAL TUNNEL RELEASE;  Surgeon: Audrey HearingStanley E Harrison, MD;  Location: [facility];  Service: Orthopedics;  Laterality: Right;  . COLONOSCOPY N/A 09/11/2015   Procedure: COLONOSCOPY;  Surgeon: West BaliSandi L Fields, MD;  [facility];  Service: Endoscopy;  Laterality: N/A;  1000  . JOINT REPLACEMENT Bilateral 2008   bilateral knees, [facility]; [facility]  . KNEE SURGERY    . LAMINECTOMY    . ORIF HUMERUS FRACTURE Right 01/11/2016   Procedure: OPEN REDUCTION INTERNAL FIXATION (ORIF) RIGHT PROXIMAL HUMERUS FRACTURE;  Surgeon: Audrey CopaScott Gregory Dean, MD;  Location: [facility];  Service: Orthopedics;  Laterality: Right;    There were no vitals filed for this visit.       Subjective Assessment - 04/11/16 0951    Subjective Pt reports she is still pain free, but her right hip continues to 'slip' or give way about 5-6x's daily. She continues to work on her HEP but needs to follow her sheet closely.    Pertinent History Fx Rt humerus, B TKR; cervical pain.                          [facility] Adult PT Treatment/Exercise - 04/11/16 0001      Lumbar Exercises: Stretches   Lower Trunk Rotation Other (comment)  Sidelying trunk rotation stretch: 2x30s bilat   Pelvic Tilt Other (comment)  20x with [place] cuff     Lumbar Exercises: Supine   Bent Knee Raise 10 reps;Other (comment)  c cuff for feedback: reciprocal pattern   Bent Knee Raise Limitations Bent knee drop: 10x bilat alt c cuff  10x5, fatigue noted   Bridge Other (comment)  2x10 flat   Bridge Limitations feet on 8" box, band around knees  2x10   Other Supine Lumbar Exercises 2x10 Hamstrings bridges, feet on chair, knees straight 2x10     Lumbar Exercises: Sidelying   Hip Abduction Other (comment);4 seconds  2x10 bilat; VC for Abduction + slight extension  PT Short Term Goals - 04/06/16 0957      PT SHORT TERM GOAL #1   Title PT core musculature to increase in strength 1/2 grade to allow pt to be able to complete bed mobility without difficulty.    Time 3   Period Weeks   Status On-going     PT SHORT TERM GOAL #2   Title Pt back pain to be no greater than a 5/10 to allow pt to be able to walk for 15 to 20 minutes.   Time 3   Period Weeks   Status On-going     PT SHORT TERM GOAL #3   Title Pt to be able to ride in a car for an hour to travel without increased low back pain    Time 3   Period Weeks   Status On-going           PT Long Term Goals - 04/06/16 0957      PT LONG TERM GOAL #1   Title Pt back pain to be no greater than a 2/10 to allow pt to walk for 30 mintues    Time 6   Period Weeks   Status On-going     PT LONG TERM  GOAL #2   Title Pt core musculature to be increased by one grade to allow pt to be able to go up and down 10 steps without increased low back pain    Time 6   Period Weeks   Status On-going     PT LONG TERM GOAL #3   Title Pt to be able to carry groceries into her home without increased low back pain    Time 6   Period Weeks   Status On-going     PT LONG TERM GOAL #4   Title Pt to be knowledable and be using proper body mechanics for lifting and pushing to reduce stress on low back    Time 6   Period Weeks   Status On-going               Plan - 04/11/16 1011    Clinical Impression Statement Pt tolerating session moderately well, with some intermitten muscle spams in right adductors, but not other increases in symptoms. The patient demonstrates continued weakness in the hips with very limited range during bridges, and quick sonset of fatigue/poor trunk control during stabilization exercises, hence frequent breaks taken.     Rehab Potential Good   PT Frequency 2x / week   PT Duration 6 weeks   PT Treatment/Interventions ADLs/Self Care Home Management;Moist Heat;Gait training;Stair training;Therapeutic activities;Therapeutic exercise;Balance training;Patient/family education;Manual techniques;Ultrasound   PT Next Visit Plan Continue to progress to higher level stabilization as able, as well as hip extension/abduction strength.   PT Home Exercise Plan Initial eval:  Ab set, LTR, pelvic tilt; 03/30/2016: Decompression exercises 1-5; t-band decompression exercises; body mechnics    Consulted and Agree with Plan of Care Patient      Patient will benefit from skilled therapeutic intervention in order to improve the following deficits and impairments:  Decreased activity tolerance, Decreased balance, Decreased strength, Difficulty walking, Postural dysfunction, Pain, Decreased mobility  Visit Diagnosis: Bilateral low back pain without sciatica  Muscle weakness  (generalized)  Difficulty in walking, not elsewhere classified     Problem List Patient Active Problem List   Diagnosis Date Noted  . Proximal humerus fracture 01/11/2016  . Heme + stool   . Heme positive stool 08/27/2015  . Bilateral hip  pain 10/14/2013  . Status post right knee replacement 10/10/2013  . Status post left knee replacement 10/10/2013  . Degenerative arthritis of knee, bilateral 10/10/2013  . Degenerative arthritis of hip 10/10/2013  . S/P carpal tunnel release 05/22/2012  . CTS (carpal tunnel syndrome) 02/29/2012  . S/P total knee replacement 08/10/2011  . TOTAL KNEE FOLLOW-UP 08/04/2010  . IMPINGEMENT SYNDROME 10/11/2007  . KNEE PAIN 05/31/2007  . OSTEOARTHRITIS, LOWER LEG 05/03/2007     10:28 AM, 04/11/16 Rosamaria Lints, PT, DPT Physical Therapist at [facility] [phone] (office)      [facility] [address] [place], [place], [address] Phone: [phone]   Fax:  [phone]  Name: KAHLAN ENGEBRETSON MRN: [id number] Date of Birth: 1950/08/07

## 2016-04-13 ENCOUNTER — Ambulatory Visit (HOSPITAL_COMMUNITY): Payer: Medicare Other | Admitting: Physical Therapy

## 2016-04-13 DIAGNOSIS — M545 Low back pain, unspecified: Secondary | ICD-10-CM

## 2016-04-13 DIAGNOSIS — R262 Difficulty in walking, not elsewhere classified: Secondary | ICD-10-CM

## 2016-04-13 DIAGNOSIS — M6281 Muscle weakness (generalized): Secondary | ICD-10-CM

## 2016-04-13 NOTE — Patient Instructions (Addendum)
Heel Raise: Bilateral (Standing)    Rise on balls of feet. Repeat _10___ times per set. Do _1___ sets per session. Do ___2_ sessions per day. 1 http://orth.exer.us/38   Copyright  VHI. All rights reserved.  Functional Quadriceps: Chair Squat    Keeping feet flat on floor, shoulder width apart, squat as low as is comfortable. Use support as necessary. Repeat _10___ times per set. Do ___1_ sets per session. Do 2____ sessions per day.  http://orth.exer.us/736   Copyright  VHI. All rights reserved.  Bridging    Slowly raise buttocks from floor, keeping stomach tight. Repeat _10___ times per set. Do __1__ sets per session. Do ____ sessions per day. 2 http://orth.exer.us/1096   Copyright  VHI. All rights reserved.  Strengthening: Hip Abduction (Side-Lying)    Tighten muscles on front of right  thigh, then lift leg _15___ inches from surface, keeping knee locked.  Repeat __10__ times per set. Do ____ sets per session. Do __2__ sessions per day.  http://orth.exer.us/622   Copyright  VHI. All rights reserved.

## 2016-04-13 NOTE — Therapy (Signed)
[facility] [address] [place], [place], [address] Phone: [phone]   Fax:  [phone]  Physical Therapy Treatment  Patient Details  Name: [name] MRN: [id number] Date of Birth: [birth year] Referring Provider: Fuller CanadaStanley [place]   Encounter Date: 04/13/2016      PT End of Session - 04/13/16 1054    Visit Number 5   Number of Visits 12   Date for PT Re-Evaluation 05/04/16   Authorization Type BCBS medicare   Authorization - Visit Number 5   Authorization - Number of Visits 10   PT Start Time 1038   PT Stop Time 1118   PT Time Calculation (min) 40 min   Activity Tolerance Patient tolerated treatment well;Patient limited by fatigue   Behavior [facility] Therapy [facility] for tasks assessed/performed      Past Medical History:  Diagnosis Date  . Anxiety    panic attack  . Arthritis   . Carpal tunnel syndrome on both sides   . Complication of anesthesia    1st knee replacement slow to awaken, 2nd knee replacement - woke up had to have more anesthesia  . High cholesterol   . History of kidney stones   . Knee joint replacement status   . Shortness of breath dyspnea    with exertion    Past Surgical History:  Procedure Laterality Date  . APPENDECTOMY    . BREAST CYST EXCISION    . BUNIONECTOMY  09/29/2011   RIGHT  . CARPAL TUNNEL RELEASE  04/13/2012   rocedure: CARPAL TUNNEL RELEASE;  Surgeon: Vickki HearingStanley E Harrison, MD;  Location: [facility];  Service: Orthopedics;  Laterality: Right;  . COLONOSCOPY N/A 09/11/2015   Procedure: COLONOSCOPY;  Surgeon: West BaliSandi L Fields, MD;  [facility];  Service: Endoscopy;  Laterality: N/A;  1000  . JOINT REPLACEMENT Bilateral 2008   bilateral knees, [facility]; [place]  . KNEE SURGERY    . LAMINECTOMY    . ORIF HUMERUS FRACTURE Right 01/11/2016   Procedure: OPEN REDUCTION INTERNAL FIXATION (ORIF) RIGHT PROXIMAL HUMERUS FRACTURE;  Surgeon: Cammy CopaScott Gregory Dean, MD;  Location: [facility];  Service:  Orthopedics;  Laterality: Right;    There were no vitals filed for this visit.      Subjective Assessment - 04/13/16 1043    Subjective Pt states that her hip is not slipping any longer it is now catching periodically.  She is occasionally doing her exercises.     Pertinent History Fx Rt humerus, B TKR; cervical pain.    Limitations Lifting;Walking;Standing;House hold activities   How long can you sit comfortably? becomes uncomfortable after 30'    How long can you stand comfortably? uncomfortable wants to sit down after 10-minutes    How long can you walk comfortably? Pt walks slowly as to not irritate her back    Patient Stated Goals less pain to be able walk and stand longer.   Currently in Pain? No/denies   Pain Onset 1 to 4 weeks ago                         [facility] Adult PT Treatment/Exercise - 04/13/16 0001      Exercises   Exercises Lumbar     Lumbar Exercises: Standing   Heel Raises 10 reps   Functional Squats 10 reps     Lumbar Exercises: Supine   Bridge 10 reps   Straight Leg Raise 10 reps     Lumbar  Exercises: Sidelying   Hip Abduction 10 reps     Lumbar Exercises: Prone   Straight Leg Raise 10 reps   Straight Leg Raises Limitations 2 pillows under abdomen.   Other Prone Lumbar Exercises glut sets; heelsqueeze x s10 each    Other Prone Lumbar Exercises shoulder extension, rows x 10 reps each                 PT Education - 04/13/16 1054    Education provided Yes   Education Details increased pt HEP   Person(s) Educated Patient   Methods Explanation;Handout   Comprehension Verbalized understanding          PT Short Term Goals - 04/06/16 0957      PT SHORT TERM GOAL #1   Title PT core musculature to increase in strength 1/2 grade to allow pt to be able to complete bed mobility without difficulty.    Time 3   Period Weeks   Status On-going     PT SHORT TERM GOAL #2   Title Pt back pain to be no greater than a 5/10 to allow  pt to be able to walk for 15 to 20 minutes.   Time 3   Period Weeks   Status On-going     PT SHORT TERM GOAL #3   Title Pt to be able to ride in a car for an hour to travel without increased low back pain    Time 3   Period Weeks   Status On-going           PT Long Term Goals - 04/06/16 0957      PT LONG TERM GOAL #1   Title Pt back pain to be no greater than a 2/10 to allow pt to walk for 30 mintues    Time 6   Period Weeks   Status On-going     PT LONG TERM GOAL #2   Title Pt core musculature to be increased by one grade to allow pt to be able to go up and down 10 steps without increased low back pain    Time 6   Period Weeks   Status On-going     PT LONG TERM GOAL #3   Title Pt to be able to carry groceries into her home without increased low back pain    Time 6   Period Weeks   Status On-going     PT LONG TERM GOAL #4   Title Pt to be knowledable and be using proper body mechanics for lifting and pushing to reduce stress on low back    Time 6   Period Weeks   Status On-going               Plan - 04/13/16 1054    Clinical Impression Statement Added exercises to pt HEP.  Treatment focused on stability of lumbar region  by strengthening core.  Pt no longer has slipping but now states that she has catching in her Rt leg.    Rehab Potential Good   PT Frequency 2x / week   PT Duration 6 weeks   PT Treatment/Interventions ADLs/Self Care Home Management;Moist Heat;Gait training;Stair training;Therapeutic activities;Therapeutic exercise;Balance training;Patient/family education;Manual techniques;Ultrasound   PT Next Visit Plan Begin standing lunging exercises    PT Home Exercise Plan Initial eval:  Ab set, LTR, pelvic tilt; 03/30/2016: Decompression exercises 1-5; t-band decompression exercises; body mechnics    Consulted and Agree with Plan of Care Patient  Patient will benefit from skilled therapeutic intervention in order to improve the following  deficits and impairments:  Decreased activity tolerance, Decreased balance, Decreased strength, Difficulty walking, Postural dysfunction, Pain, Decreased mobility  Visit Diagnosis: Muscle weakness (generalized)  Bilateral low back pain without sciatica  Difficulty in walking, not elsewhere classified     Problem List Patient Active Problem List   Diagnosis Date Noted  . Proximal humerus fracture 01/11/2016  . Heme + stool   . Heme positive stool 08/27/2015  . Bilateral hip pain 10/14/2013  . Status post right knee replacement 10/10/2013  . Status post left knee replacement 10/10/2013  . Degenerative arthritis of knee, bilateral 10/10/2013  . Degenerative arthritis of hip 10/10/2013  . S/P carpal tunnel release 05/22/2012  . CTS (carpal tunnel syndrome) 02/29/2012  . S/P total knee replacement 08/10/2011  . TOTAL KNEE FOLLOW-UP 08/04/2010  . IMPINGEMENT SYNDROME 10/11/2007  . KNEE PAIN 05/31/2007  . OSTEOARTHRITIS, LOWER LEG 05/03/2007    Virgina Organ, PT CLT [phone] 04/13/2016, 11:18 AM  [facility] [address] [place], [place], [address] Phone: [phone]   Fax:  [phone]  Name: [name] MRN: [id number] Date of Birth: [birth year]

## 2016-04-19 ENCOUNTER — Ambulatory Visit (HOSPITAL_COMMUNITY): Payer: Medicare Other

## 2016-04-19 DIAGNOSIS — M545 Low back pain, unspecified: Secondary | ICD-10-CM

## 2016-04-19 DIAGNOSIS — M6281 Muscle weakness (generalized): Secondary | ICD-10-CM

## 2016-04-19 DIAGNOSIS — R262 Difficulty in walking, not elsewhere classified: Secondary | ICD-10-CM

## 2016-04-19 NOTE — Therapy (Signed)
[place] [facility] [address] [place], [place], [address] Phone: [phone]   Fax:  [phone]  Physical Therapy Treatment  Patient Details  Name: [name] MRN: [id number] Date of Birth: [birth year] Referring Provider: [name]   Encounter Date: 04/19/2016      PT End of Session - 04/19/16 1036    Visit Number 6   Number of Visits 12   Date for PT Re-Evaluation 05/04/16   Authorization Type BCBS medicare   Authorization - Visit Number 6   Authorization - Number of Visits 10   PT Start Time 1034   PT Stop Time 1112   PT Time Calculation (min) 38 min   Activity Tolerance Patient tolerated treatment well   Behavior During Therapy [facility] for tasks assessed/performed      Past Medical History:  Diagnosis Date  . Anxiety    panic attack  . Arthritis   . Carpal tunnel syndrome on both sides   . Complication of anesthesia    1st knee replacement slow to awaken, 2nd knee replacement - woke up had to have more anesthesia  . High cholesterol   . History of kidney stones   . Knee joint replacement status   . Shortness of breath dyspnea    with exertion    Past Surgical History:  Procedure Laterality Date  . APPENDECTOMY    . BREAST CYST EXCISION    . BUNIONECTOMY  09/29/2011   RIGHT  . CARPAL TUNNEL RELEASE  04/13/2012   rocedure: CARPAL TUNNEL RELEASE;  Surgeon: Vickki Hearing, MD;  Location: [facility];  Service: Orthopedics;  Laterality: Right;  . COLONOSCOPY N/A 09/11/2015   Procedure: COLONOSCOPY;  Surgeon: West Bali, MD;  Location: [facility];  Service: Endoscopy;  Laterality: N/A;  1000  . JOINT REPLACEMENT Bilateral 2008   bilateral knees, [facility]; [place]  . KNEE SURGERY    . LAMINECTOMY    . ORIF HUMERUS FRACTURE Right 01/11/2016   Procedure: OPEN REDUCTION INTERNAL FIXATION (ORIF) RIGHT PROXIMAL HUMERUS FRACTURE;  Surgeon: [name], MD;  Location: [facility] OR;  Service: Orthopedics;  Laterality: Right;     There were no vitals filed for this visit.      Subjective Assessment - 04/19/16 1034    Subjective Pt stated she continues to have LE catching with no reports of recent falls.  No report of pain today.  Reports compliance with HEP daily.     Pertinent History Fx Rt humerus, B TKR; cervical pain.    Patient Stated Goals less pain to be able walk and stand longer.   Currently in Pain? No/denies                         [facility] Adult PT Treatment/Exercise - 04/19/16 0001      Lumbar Exercises: Standing   Heel Raises 15 reps   Heel Raises Limitations Toe raises 15x   Functional Squats 10 reps   Forward Lunge 10 reps   Forward Lunge Limitations 6in step 5 complete with HHA 5 without     Lumbar Exercises: Supine   Bridge 15 reps   Bridge Limitations RTB aound knees     Lumbar Exercises: Sidelying   Hip Abduction 15 reps   Hip Abduction Limitations min cueing for form initially     Lumbar Exercises: Prone   Straight Leg Raise 10 reps   Straight Leg Raises Limitations 2 pillows under abdomen.  Other Prone Lumbar Exercises heelsqueeze 15x each    Other Prone Lumbar Exercises shoulder extension, rows x 10 reps each                   PT Short Term Goals - 04/06/16 0957      PT SHORT TERM GOAL #1   Title PT core musculature to increase in strength 1/2 grade to allow pt to be able to complete bed mobility without difficulty.    Time 3   Period Weeks   Status On-going     PT SHORT TERM GOAL #2   Title Pt back pain to be no greater than a 5/10 to allow pt to be able to walk for 15 to 20 minutes.   Time 3   Period Weeks   Status On-going     PT SHORT TERM GOAL #3   Title Pt to be able to ride in a car for an hour to travel without increased low back pain    Time 3   Period Weeks   Status On-going           PT Long Term Goals - 04/06/16 0957      PT LONG TERM GOAL #1   Title Pt back pain to be no greater than a 2/10 to allow pt to walk  for 30 mintues    Time 6   Period Weeks   Status On-going     PT LONG TERM GOAL #2   Title Pt core musculature to be increased by one grade to allow pt to be able to go up and down 10 steps without increased low back pain    Time 6   Period Weeks   Status On-going     PT LONG TERM GOAL #3   Title Pt to be able to carry groceries into her home without increased low back pain    Time 6   Period Weeks   Status On-going     PT LONG TERM GOAL #4   Title Pt to be knowledable and be using proper body mechanics for lifting and pushing to reduce stress on low back    Time 6   Period Weeks   Status On-going               Plan - 04/19/16 1108    Clinical Impression Statement Session focus on improving stabilty of lumbar region by strengthening core.  Added forward lunges to POC with and without HHA for core activation and strengthening.  No c/o pain or slipping through session, did report an 1 catch Rt anterior leg with standing exercise today.     Rehab Potential Good   PT Frequency 2x / week   PT Duration 6 weeks   PT Treatment/Interventions ADLs/Self Care Home Management;Moist Heat;Gait training;Stair training;Therapeutic activities;Therapeutic exercise;Balance training;Patient/family education;Manual techniques;Ultrasound   PT Next Visit Plan Progress CKC for lumbar stabiltiy and LE strengthening.  Begin SLS next session.   PT Home Exercise Plan Initial eval:  Ab set, LTR, pelvic tilt; 03/30/2016: Decompression exercises 1-5; t-band decompression exercises; body mechnics       Patient will benefit from skilled therapeutic intervention in order to improve the following deficits and impairments:  Decreased activity tolerance, Decreased balance, Decreased strength, Difficulty walking, Postural dysfunction, Pain, Decreased mobility  Visit Diagnosis: Muscle weakness (generalized)  Bilateral low back pain without sciatica  Difficulty in walking, not elsewhere  classified     Problem List Patient Active Problem List  Diagnosis Date Noted  . Proximal humerus fracture 01/11/2016  . Heme + stool   . Heme positive stool 08/27/2015  . Bilateral hip pain 10/14/2013  . Status post right knee replacement 10/10/2013  . Status post left knee replacement 10/10/2013  . Degenerative arthritis of knee, bilateral 10/10/2013  . Degenerative arthritis of hip 10/10/2013  . S/P carpal tunnel release 05/22/2012  . CTS (carpal tunnel syndrome) 02/29/2012  . S/P total knee replacement 08/10/2011  . TOTAL KNEE FOLLOW-UP 08/04/2010  . IMPINGEMENT SYNDROME 10/11/2007  . KNEE PAIN 05/31/2007  . OSTEOARTHRITIS, LOWER LEG 05/03/2007   Becky Sax, LPTA; CBIS [phone]  Juel Burrow 04/19/2016, 11:13 AM  [facility] [address] [place], [place], [address] Phone: [phone]   Fax:  [phone]  Name: [name] MRN: [id number] Date of Birth: Jan 01, 1951

## 2016-04-21 ENCOUNTER — Ambulatory Visit (HOSPITAL_COMMUNITY): Payer: Medicare Other | Admitting: Physical Therapy

## 2016-04-21 DIAGNOSIS — M6281 Muscle weakness (generalized): Secondary | ICD-10-CM

## 2016-04-21 DIAGNOSIS — M545 Low back pain, unspecified: Secondary | ICD-10-CM

## 2016-04-21 DIAGNOSIS — R262 Difficulty in walking, not elsewhere classified: Secondary | ICD-10-CM

## 2016-04-21 NOTE — Therapy (Signed)
[facility] [address] [place], [place], [address] Phone: [phone]   Fax:  [phone]  Physical Therapy Treatment  Patient Details  Name: [name] MRN: [id number] Date of Birth: June 27, 1951 Referring Provider: [name]   Encounter Date: 04/21/2016      PT End of Session - 04/21/16 1308    Visit Number 7   Number of Visits 12   Date for PT Re-Evaluation 05/04/16   Authorization Type BCBS medicare   Authorization - Visit Number 7   Authorization - Number of Visits 10   PT Start Time 1030   PT Stop Time 1115   PT Time Calculation (min) 45 min   Activity Tolerance Patient tolerated treatment well   Behavior During Therapy [facility] for tasks assessed/performed      Past Medical History:  Diagnosis Date  . Anxiety    panic attack  . Arthritis   . Carpal tunnel syndrome on both sides   . Complication of anesthesia    1st knee replacement slow to awaken, 2nd knee replacement - woke up had to have more anesthesia  . High cholesterol   . History of kidney stones   . Knee joint replacement status   . Shortness of breath dyspnea    with exertion    Past Surgical History:  Procedure Laterality Date  . APPENDECTOMY    . BREAST CYST EXCISION    . BUNIONECTOMY  09/29/2011   RIGHT  . CARPAL TUNNEL RELEASE  04/13/2012   rocedure: CARPAL TUNNEL RELEASE;  Surgeon: Vickki Hearing, MD;  Location: [facility];  Service: Orthopedics;  Laterality: Right;  . COLONOSCOPY N/A 09/11/2015   Procedure: COLONOSCOPY;  Surgeon: West Bali, MD;  Location: [facility];  Service: Endoscopy;  Laterality: N/A;  1000  . JOINT REPLACEMENT Bilateral 2008   bilateral knees, [facility]; [place]  . KNEE SURGERY    . LAMINECTOMY    . ORIF HUMERUS FRACTURE Right 01/11/2016   Procedure: OPEN REDUCTION INTERNAL FIXATION (ORIF) RIGHT PROXIMAL HUMERUS FRACTURE;  Surgeon: [name], MD;  Location: [facility] OR;  Service: Orthopedics;  Laterality: Right;     There were no vitals filed for this visit.      Subjective Assessment - 04/21/16 1056    Subjective Pt states that she had significant pain after last treatment.  She can not stand to stand for any period of time.  Her pain is mainly in her Rt Upper thigh.  Pain was so severe that if she did not have the buggy when shopping she feels like she may have fallen    Pertinent History Fx Rt humerus, B TKR; cervical pain.    Limitations Lifting;Walking;Standing;House hold activities   How long can you sit comfortably? becomes uncomfortable after 30'    How long can you stand comfortably? 10-15 minutes    How long can you walk comfortably? 10-15 minutes    Patient Stated Goals less pain to be able walk and stand longer.   Pain Score 5    Pain Location Leg   Pain Orientation Right   Pain Descriptors / Indicators Aching   Pain Type Chronic pain   Pain Onset More than a month ago   Aggravating Factors  weight bearing    Pain Relieving Factors sitting   Effect of Pain on Daily Activities increases  [facility] Adult PT Treatment/Exercise - 04/21/16 0001      Exercises   Exercises Lumbar     Lumbar Exercises: Standing   Other Standing Lumbar Exercises using dowel minisquat, hip hinge    Other Standing Lumbar Exercises scapular retraction x 5      Lumbar Exercises: Supine   Ab Set 10 reps  6 pt    Other Supine Lumbar Exercises Decompression exercise 1-5     Manual Therapy   Manual Therapy Joint mobilization;Soft tissue mobilization;Muscle Energy Technique   Manual therapy comments Rt SI   Soft tissue mobilization paraspinal musculature.                 PT Education - 04/21/16 1307    Education provided Yes   Education Details hip hinge for sit to stand activity    Person(s) Educated Patient   Methods Explanation   Comprehension Verbalized understanding          PT Short Term Goals - 04/06/16 0957      PT SHORT TERM GOAL #1    Title PT core musculature to increase in strength 1/2 grade to allow pt to be able to complete bed mobility without difficulty.    Time 3   Period Weeks   Status On-going     PT SHORT TERM GOAL #2   Title Pt back pain to be no greater than a 5/10 to allow pt to be able to walk for 15 to 20 minutes.   Time 3   Period Weeks   Status On-going     PT SHORT TERM GOAL #3   Title Pt to be able to ride in a car for an hour to travel without increased low back pain    Time 3   Period Weeks   Status On-going           PT Long Term Goals - 04/06/16 0957      PT LONG TERM GOAL #1   Title Pt back pain to be no greater than a 2/10 to allow pt to walk for 30 mintues    Time 6   Period Weeks   Status On-going     PT LONG TERM GOAL #2   Title Pt core musculature to be increased by one grade to allow pt to be able to go up and down 10 steps without increased low back pain    Time 6   Period Weeks   Status On-going     PT LONG TERM GOAL #3   Title Pt to be able to carry groceries into her home without increased low back pain    Time 6   Period Weeks   Status On-going     PT LONG TERM GOAL #4   Title Pt to be knowledable and be using proper body mechanics for lifting and pushing to reduce stress on low back    Time 6   Period Weeks   Status On-going               Plan - 04/21/16 1308    Clinical Impression Statement Pt with significant increased pain today.  Emphasised decompression position and abdominal strengthening in a safe position.  Manual started to improve pelvic alignment.    Rehab Potential Good   PT Frequency 2x / week   PT Duration 6 weeks   PT Treatment/Interventions ADLs/Self Care Home Management;Moist Heat;Gait training;Stair training;Therapeutic activities;Therapeutic exercise;Balance training;Patient/family education;Manual techniques;Ultrasound   PT Next Visit Plan Continue with  manual if pt feels it was benefical; Progress CKC for lumbar stabiltiy and LE  strengthening.     PT Home Exercise Plan Initial eval:  Ab set, LTR, pelvic tilt; 03/30/2016: Decompression exercises 1-5; t-band decompression exercises; body mechnics       Patient will benefit from skilled therapeutic intervention in order to improve the following deficits and impairments:  Decreased activity tolerance, Decreased balance, Decreased strength, Difficulty walking, Postural dysfunction, Pain, Decreased mobility  Visit Diagnosis: Muscle weakness (generalized)  Bilateral low back pain without sciatica  Difficulty in walking, not elsewhere classified     Problem List Patient Active Problem List   Diagnosis Date Noted  . Proximal humerus fracture 01/11/2016  . Heme + stool   . Heme positive stool 08/27/2015  . Bilateral hip pain 10/14/2013  . Status post right knee replacement 10/10/2013  . Status post left knee replacement 10/10/2013  . Degenerative arthritis of knee, bilateral 10/10/2013  . Degenerative arthritis of hip 10/10/2013  . S/P carpal tunnel release 05/22/2012  . CTS (carpal tunnel syndrome) 02/29/2012  . S/P total knee replacement 08/10/2011  . TOTAL KNEE FOLLOW-UP 08/04/2010  . IMPINGEMENT SYNDROME 10/11/2007  . KNEE PAIN 05/31/2007  . OSTEOARTHRITIS, LOWER LEG 05/03/2007    Virgina Organynthia Mohamed [place], PT CLT [phone] 04/21/2016, 2:16 PM  [facility] [address] [place] , [place], [address] Phone: [phone]   Fax:  [phone]  Name: Suszanne FinchBarbara K Bubolz MRN: [id number] Date of Birth: 09/24/1950

## 2016-04-22 ENCOUNTER — Telehealth (HOSPITAL_COMMUNITY): Payer: Self-pay | Admitting: Physical Therapy

## 2016-04-22 NOTE — Telephone Encounter (Signed)
Spoke to pt -to cx Cr covers Acute for Beth D.

## 2016-04-26 ENCOUNTER — Encounter (HOSPITAL_COMMUNITY): Payer: Medicare Other | Admitting: Physical Therapy

## 2016-04-28 ENCOUNTER — Ambulatory Visit (HOSPITAL_COMMUNITY): Payer: Medicare Other | Attending: Orthopedic Surgery | Admitting: Physical Therapy

## 2016-04-28 DIAGNOSIS — G8929 Other chronic pain: Secondary | ICD-10-CM | POA: Insufficient documentation

## 2016-04-28 DIAGNOSIS — M545 Low back pain, unspecified: Secondary | ICD-10-CM

## 2016-04-28 DIAGNOSIS — M6281 Muscle weakness (generalized): Secondary | ICD-10-CM | POA: Diagnosis not present

## 2016-04-28 DIAGNOSIS — R262 Difficulty in walking, not elsewhere classified: Secondary | ICD-10-CM | POA: Insufficient documentation

## 2016-04-28 NOTE — Therapy (Signed)
[facility] [address] [place], [place], [address] Phone: [phone]   Fax:  [phone]  Physical Therapy Treatment  Patient Details  Name: [name] MRN: [id number] Date of Birth: 07-Jan-1951 Referring Provider: Fuller CanadaStanley [place]   Encounter Date: 04/28/2016      PT End of Session - 04/28/16 1117    Visit Number 8   Number of Visits 12   Date for PT Re-Evaluation 05/04/16   Authorization Type BCBS medicare   Authorization - Visit Number 8   Authorization - Number of Visits 10   PT Start Time 1035   PT Stop Time 1118   PT Time Calculation (min) 43 min   Activity Tolerance Patient tolerated treatment well   Behavior [facility] for tasks assessed/performed      Past Medical History:  Diagnosis Date  . Anxiety    panic attack  . Arthritis   . Carpal tunnel syndrome on both sides   . Complication of anesthesia    1st knee replacement slow to awaken, 2nd knee replacement - woke up had to have more anesthesia  . High cholesterol   . History of kidney stones   . Knee joint replacement status   . Shortness of breath dyspnea    with exertion    Past Surgical History:  Procedure Laterality Date  . APPENDECTOMY    . BREAST CYST EXCISION    . BUNIONECTOMY  09/29/2011   RIGHT  . CARPAL TUNNEL RELEASE  04/13/2012   rocedure: CARPAL TUNNEL RELEASE;  Surgeon: Vickki HearingStanley E [place], MD;  Location: [facility];  Service: Orthopedics;  Laterality: Right;  . COLONOSCOPY N/A 09/11/2015   Procedure: COLONOSCOPY;  Surgeon: West BaliSandi L Fields, MD;  Location: [facility];  Service: Endoscopy;  Laterality: N/A;  1000  . JOINT REPLACEMENT Bilateral 2008   bilateral knees, [facility]; [place]  . KNEE SURGERY    . LAMINECTOMY    . ORIF HUMERUS FRACTURE Right 01/11/2016   Procedure: OPEN REDUCTION INTERNAL FIXATION (ORIF) RIGHT PROXIMAL HUMERUS FRACTURE;  Surgeon: Cammy CopaScott Gregory Dean, MD;  Location: [facility] OR;  Service: Orthopedics;  Laterality: Right;     There were no vitals filed for this visit.      Subjective Assessment - 04/28/16 1035    Subjective Pt states that her back feels fine and her hip has not "catched",  or "give way"  this week.     Pertinent History Fx Rt humerus, B TKR; cervical pain.    Limitations Lifting;Walking;Standing;House hold activities   How long can you sit comfortably? becomes uncomfortable after 30'    How long can you stand comfortably? 10-15 minutes    How long can you walk comfortably? 10-15 minutes    Patient Stated Goals less pain to be able walk and stand longer.   Currently in Pain? No/denies   Pain Onset More than a month ago                  [facility] Adult PT Treatment/Exercise - 04/28/16 0001      Balance Poses: Yoga   Warrior I 1 rep;60 seconds     Lumbar Exercises: Stretches   Active Hamstring Stretch 1 rep;60 seconds   Prone on Elbows Stretch 3 reps;10 seconds   Quad Stretch 1 rep;60 seconds   ITB Stretch Limitations hip flexor stretch x 60; wall stretchx 5.      Lumbar Exercises: Standing   Other Standing Lumbar Exercises gait heel toe with  arm swing    Other Standing Lumbar Exercises wall arch x10/ SLS x 3 bilateral LE       Lumbar Exercises: Seated   Sit to Stand 10 reps     Lumbar Exercises: Sidelying   Hip Abduction 15 reps     Lumbar Exercises: Prone   Straight Leg Raise 10 reps   Other Prone Lumbar Exercises heelsqueeze 15x each    Other Prone Lumbar Exercises  axial extension, shoulder extension, rows x 10 reps each       manual to decrease spasms in Bilateral paraspinal musculature with good results.   Manual completed separate from  All other aspects of treatment           PT Education - 04/28/16 1102    Education provided Yes   Education Details Single leg stance for balance, heel toe gait and stretches    Person(s) Educated Patient   Methods Explanation;Demonstration;Handout   Comprehension Verbalized understanding;Returned demonstration           PT Short Term Goals - 04/28/16 1119      PT SHORT TERM GOAL #1   Title PT core musculature to increase in strength 1/2 grade to allow pt to be able to complete bed mobility without difficulty.    Time 3   Period Weeks   Status On-going     PT SHORT TERM GOAL #2   Title Pt back pain to be no greater than a 5/10 to allow pt to be able to walk for 15 to 20 minutes.   Time 3   Period Weeks   Status On-going     PT SHORT TERM GOAL #3   Title Pt to be able to ride in a car for an hour to travel without increased low back pain    Time 3   Period Weeks   Status On-going           PT Long Term Goals - 04/28/16 1120      PT LONG TERM GOAL #1   Title Pt back pain to be no greater than a 2/10 to allow pt to walk for 30 mintues    Time 6   Period Weeks   Status On-going     PT LONG TERM GOAL #2   Title Pt core musculature to be increased by one grade to allow pt to be able to go up and down 10 steps without increased low back pain    Time 6   Period Weeks   Status On-going     PT LONG TERM GOAL #3   Title Pt to be able to carry groceries into her home without increased low back pain    Time 6   Period Weeks   Status On-going     PT LONG TERM GOAL #4   Title Pt to be knowledable and be using proper body mechanics for lifting and pushing to reduce stress on low back    Time 6   Period Weeks   Status On-going               Plan - 04/28/16 1118    Clinical Impression Statement Todays treatment focused on on stretching of LE hip and knee mm as well as normalizing gait and improved strength.  Pt instructed in new exercises as above with good technique after verbal cuing    Rehab Potential Good   PT Frequency 2x / week   PT Duration 6 weeks   PT Treatment/Interventions  ADLs/Self Care Home Management;Moist Heat;Gait training;Stair training;Therapeutic activities;Therapeutic exercise;Balance training;Patient/family education;Manual techniques;Ultrasound   PT  Next Visit Plan Continue with manual if pt feels it was benefical; Progress CKC for lumbar stabiltiy and LE strengthening.     Consulted and Agree with Plan of Care Patient      Patient will benefit from skilled therapeutic intervention in order to improve the following deficits and impairments:  Decreased activity tolerance, Decreased balance, Decreased strength, Difficulty walking, Postural dysfunction, Pain, Decreased mobility  Visit Diagnosis: Muscle weakness (generalized)  Chronic bilateral low back pain without sciatica  Difficulty in walking, not elsewhere classified     Problem List Patient Active Problem List   Diagnosis Date Noted  . Proximal humerus fracture 01/11/2016  . Heme + stool   . Heme positive stool 08/27/2015  . Bilateral hip pain 10/14/2013  . Status post right knee replacement 10/10/2013  . Status post left knee replacement 10/10/2013  . Degenerative arthritis of knee, bilateral 10/10/2013  . Degenerative arthritis of hip 10/10/2013  . S/P carpal tunnel release 05/22/2012  . CTS (carpal tunnel syndrome) 02/29/2012  . S/P total knee replacement 08/10/2011  . TOTAL KNEE FOLLOW-UP 08/04/2010  . IMPINGEMENT SYNDROME 10/11/2007  . KNEE PAIN 05/31/2007  . OSTEOARTHRITIS, LOWER LEG 05/03/2007    Virgina Organ, PT CLT [phone] 04/28/2016, 11:21 AM  [facility] [address] [place], [place], [address] Phone: [phone]   Fax:  [phone]  Name: [name] MRN: [id number] Date of Birth: [birth year]

## 2016-04-28 NOTE — Patient Instructions (Addendum)
Balance: Unilateral    Attempt to balance on left leg, eyes open. Hold _30___ seconds. Repeat _3___ times per set. Do _1___ sets per session. Do _1___ sessions per day. Repeat to right  http://orth.exer.us/28   Copyright  VHI. All rights reserved.  Quads / HF, Prone   Use a dog leash  Lie face down, knees together. Grasp one ankle with same-side hand. Use towel if needed to reach. Gently pull foot toward buttock. Hold _60__ seconds. Repeat __1_ times per session. Do _1__ sessions per day.  Copyright  VHI. All rights reserved.  Quads / HF, Lunge    Step into deep forward lunge, hands on forward thigh, behind knee lightly touching floor. Push touching knee straight. Do not allow front knee past line of toes. Hold __30_ seconds. Repeat __1 times per session. Do __1_ sessions per day.  Copyright  VHI. All rights reserved.

## 2016-05-03 ENCOUNTER — Ambulatory Visit (HOSPITAL_COMMUNITY): Payer: Medicare Other | Admitting: Physical Therapy

## 2016-05-03 DIAGNOSIS — M545 Low back pain: Secondary | ICD-10-CM

## 2016-05-03 DIAGNOSIS — M6281 Muscle weakness (generalized): Secondary | ICD-10-CM

## 2016-05-03 DIAGNOSIS — G8929 Other chronic pain: Secondary | ICD-10-CM

## 2016-05-03 DIAGNOSIS — R262 Difficulty in walking, not elsewhere classified: Secondary | ICD-10-CM

## 2016-05-03 NOTE — Therapy (Signed)
[name], [place], [address] Phone: [phone]   Fax:  [phone]  Physical Therapy Treatment  Patient Details  Name: [name] MRN: [id number] Date of Birth: [birth year] Referring Provider: Arther Abbott   Encounter Date: 05/03/2016      PT End of Session - 05/03/16 1353    Visit Number 9   Number of Visits 9   Date for PT Re-Evaluation 05/04/16   Authorization Type [employer] medicare   Authorization - Visit Number 9   Authorization - Number of Visits 9   PT Start Time [phone]   PT Stop Time 1033   PT Time Calculation (min) 41 min   Activity Tolerance Patient tolerated treatment well   Behavior During Therapy [facility] for tasks assessed/performed      Past Medical History:  Diagnosis Date  . Anxiety    panic attack  . Arthritis   . Carpal tunnel syndrome on both sides   . Complication of anesthesia    1st knee replacement slow to awaken, 2nd knee replacement - woke up had to have more anesthesia  . High cholesterol   . History of kidney stones   . Knee joint replacement status   . Shortness of breath dyspnea    with exertion    Past Surgical History:  Procedure Laterality Date  . APPENDECTOMY    . BREAST CYST EXCISION    . BUNIONECTOMY  09/29/2011   RIGHT  . CARPAL TUNNEL RELEASE  04/13/2012   rocedure: CARPAL TUNNEL RELEASE;  Surgeon: Carole Civil, MD;  Location: [facility];  Service: Orthopedics;  Laterality: Right;  . COLONOSCOPY N/A 09/11/2015   Procedure: COLONOSCOPY;  Surgeon: Danie Binder, MD;  Location: [facility];  Service: Endoscopy;  Laterality: N/A;  1000  . JOINT REPLACEMENT Bilateral 2008   bilateral knees, [facility]; [place]  . KNEE SURGERY    . LAMINECTOMY    . ORIF HUMERUS FRACTURE Right 01/11/2016   Procedure: OPEN REDUCTION INTERNAL FIXATION (ORIF) RIGHT PROXIMAL HUMERUS FRACTURE;  Surgeon: Meredith Pel, MD;  Location: [facility];  Service: Orthopedics;  Laterality: Right;     There were no vitals filed for this visit.      Subjective Assessment - 05/03/16 0955    Subjective [name] states that in the past week she has noticed that her catching and slipping of her hip has decreased in frequency    Pertinent History Fx Rt humerus, B TKR; cervical pain.    Limitations Lifting;Walking;Standing;House hold activities   How long can you sit comfortably? Pt is able to sit for 40 minutes and is comfortable but has difficulty coming to an erect position.  Was uncomfortable after 30'    How long can you stand comfortably?  Able to stand for up to 30 minutes was 10-15 minutes    How long can you walk comfortably? Pt is not walking for exercise at this point.  She can walk up to 30 minutes was 10-15 minutes    Patient Stated Goals less pain to be able walk and stand longer.   Currently in Pain? No/denies  pain only when it catches or slips when this happens it the pain is at an 8/10    Pain Location Hip   Pain Orientation Right;Lateral   Pain Descriptors / Indicators Sharp   Pain Type Chronic pain   Pain Onset More than a month ago   Aggravating Factors  unsure   Pain  Relieving Factors pain goes away quickly             [facility] PT Assessment - 05/03/16 0001      Assessment   Medical Diagnosis Lumbar DDD   Referring Provider Arther Abbott    Onset Date/Surgical Date 02/23/16   Next MD Visit 05/29/2016   Prior Therapy OT for arm, for TKR and hip therapy      Precautions   Precautions Fall   Precaution Comments MD requst core strengthening: no squats , SLR, QS or TKE      Restrictions   Weight Bearing Restrictions No     Balance Screen   Has the patient fallen in the past 6 months Yes   How many times? 1   Has the patient had a decrease in activity level because of a fear of falling?  Yes   Is the patient reluctant to leave their home because of a fear of falling?  No     Home Environment   Living Environment Private residence   Type of [place] to enter     Prior Function   Level of Independence Independent   Vocation Retired   Leisure play with grandchildren, walking ,      Observation/Other Assessments   Focus on Therapeutic Outcomes (FOTO)  47     Functional Tests   Functional tests Single leg stance;Sit to Stand     Single Leg Stance   Comments Rt 54 was 21 seconds; LT 60 was 28 seconds      Sit to Stand   Comments  was 5 x 23.98; now 5 x in 12.00     Posture/Postural Control   Posture/Postural Control Postural limitations   Postural Limitations Rounded Shoulders;Increased lumbar lordosis;Increased thoracic kyphosis     Strength   Right Hip Flexion 5/5  was 4/5    Right Hip Extension 5/5  was 3-/5    Right Hip ABduction 5/5  was 4/5   Left Hip Flexion 5/5   Left Hip Extension 5/5  was 5/5    Left Hip ABduction 5/5   Right Knee Flexion 5/5   Right Knee Extension 5/5   Left Knee Flexion 5/5   Left Knee Extension 5/5   Right Ankle Dorsiflexion 5/5   Right Ankle Plantar Flexion 5/5   Left Ankle Dorsiflexion 5/5  was 4+/5   Left Ankle Plantar Flexion 5/5     Palpation   Palpation comment tight paraspinal mm                      [facility] Adult PT Treatment/Exercise - 05/03/16 0001      Lumbar Exercises: Standing   Other Standing Lumbar Exercises single leg stance x 5 bilaterally      Lumbar Exercises: Seated   Sit to Stand 10 reps     Lumbar Exercises: Prone   Straight Leg Raise 10 reps                PT Education - 05/03/16 1233    Education provided Yes   Education Details the importance of good posture, keeping up balance exercises and walking    Person(s) Educated Patient   Methods Explanation   Comprehension Verbalized understanding          PT Short Term Goals - 05/03/16 1019      PT SHORT TERM GOAL #1   Title PT core musculature to increase in strength 1/2  grade to allow pt to be able to complete bed mobility without difficulty.     Time 3   Period Weeks   Status Achieved     PT SHORT TERM GOAL #2   Title Pt back pain to be no greater than a 5/10 to allow pt to be able to walk for 15 to 20 minutes.   Time 3   Period Weeks   Status Achieved     PT SHORT TERM GOAL #3   Title Pt to be able to ride in a car for an hour to travel without increased low back pain    Time 3   Period Weeks   Status Achieved           PT Long Term Goals - 06-01-16 1019      PT LONG TERM GOAL #1   Title Pt back pain to be no greater than a 2/10 to allow pt to walk for 30 mintues    Time 6   Period Weeks   Status Achieved     PT LONG TERM GOAL #2   Title Pt core musculature to be increased by one grade to allow pt to be able to go up and down 10 steps without increased low back pain    Time 6   Period Weeks   Status Achieved     PT LONG TERM GOAL #3   Title Pt to be able to carry groceries into her home without increased low back pain    Time 6   Period Weeks   Status Achieved     PT LONG TERM GOAL #4   Title Pt to be knowledable and be using proper body mechanics for lifting and pushing to reduce stress on low back    Time 6   Period Weeks   Status Achieved               Plan - 06-01-2016 1354    Clinical Impression Statement Pt reassessed today with significant improvment in muscle strength, balance and overall function.  Although pt continues to have "catching and slipping" in her right hip the frequency is decreasing.  PT has met the majority of her goals and feels that she can continue her treatment on her own at home.     Rehab Potential Good   PT Frequency 2x / week   PT Duration 6 weeks   PT Treatment/Interventions ADLs/Self Care Home Management;Moist Heat;Gait training;Stair training;Therapeutic activities;Therapeutic exercise;Balance training;Patient/family education;Manual techniques;Ultrasound   PT Next Visit Plan Discharge pt to HEP   Consulted and Agree with Plan of Care Patient      Patient  will benefit from skilled therapeutic intervention in order to improve the following deficits and impairments:  Decreased activity tolerance, Decreased balance, Decreased strength, Difficulty walking, Postural dysfunction, Pain, Decreased mobility  Visit Diagnosis: Muscle weakness (generalized)  Chronic bilateral low back pain without sciatica  Difficulty in walking, not elsewhere classified       G-Codes - June 01, 2016 1357    Functional Limitation Other PT primary   Other PT Primary Goal Status (Q3300) At least 40 percent but less than 60 percent impaired, limited or restricted   Other PT Primary Discharge Status (T6226) At least 40 percent but less than 60 percent impaired, limited or restricted      Problem List Patient Active Problem List   Diagnosis Date Noted  . Proximal humerus fracture 01/11/2016  . Heme + stool   . Heme positive stool 08/27/2015  .  Bilateral hip pain 10/14/2013  . Status post right knee replacement 10/10/2013  . Status post left knee replacement 10/10/2013  . Degenerative arthritis of knee, bilateral 10/10/2013  . Degenerative arthritis of hip 10/10/2013  . S/P carpal tunnel release 05/22/2012  . CTS (carpal tunnel syndrome) 02/29/2012  . S/P total knee replacement 08/10/2011  . TOTAL KNEE FOLLOW-UP 08/04/2010  . IMPINGEMENT SYNDROME 10/11/2007  . KNEE PAIN 05/31/2007  . OSTEOARTHRITIS, LOWER LEG 05/03/2007    Rayetta Humphrey, PT CLT [phone] 05/03/2016, 1:58 PM  [facility] [address] [place], [place], [address] Phone: [phone]   Fax:  [phone]  Name: EVANNA WASHINTON MRN: [id number] Date of Birth: 1951-02-10  PHYSICAL THERAPY DISCHARGE SUMMARY  Visits from Start of Care: 9  Current functional level related to goals / functional outcomes: See above    Remaining deficits: Still has occasional "slipping and catching" of her hip.    Education / Equipment: HEP Plan: Patient  agrees to discharge.  Patient goals were partially met. Patient is being discharged due to being pleased with the current functional level.  ?????        Rayetta Humphrey, [place] CLT [phone]

## 2016-05-06 ENCOUNTER — Encounter (HOSPITAL_COMMUNITY): Payer: Medicare Other | Admitting: Physical Therapy

## 2016-05-11 ENCOUNTER — Encounter (HOSPITAL_COMMUNITY): Payer: Medicare Other | Admitting: Physical Therapy

## 2016-05-16 ENCOUNTER — Encounter (HOSPITAL_COMMUNITY): Payer: Medicare Other | Admitting: Physical Therapy

## 2016-05-23 ENCOUNTER — Ambulatory Visit (HOSPITAL_COMMUNITY)
Admission: RE | Admit: 2016-05-23 | Discharge: 2016-05-23 | Disposition: A | Discharge status: Home / Self Care | Payer: Medicare Other | Source: Ambulatory Visit | Attending: Obstetrics and Gynecology | Admitting: Obstetrics and Gynecology

## 2016-05-23 DIAGNOSIS — Z1231 Encounter for screening mammogram for malignant neoplasm of breast: Secondary | ICD-10-CM | POA: Diagnosis present

## 2016-06-20 ENCOUNTER — Ambulatory Visit: Payer: Medicare Other | Admitting: Orthopedic Surgery

## 2017-04-18 IMAGING — MR MR LUMBAR SPINE W/O CM
4 of 5 series · 15 of 48 positions shown · non-contrast
Comparison: Lumbar MRI 07/26/2005.

CLINICAL DATA: Low back pain with right hip weakness for 3 months.
History of back surgery. No known injury. Initial encounter.

EXAM:
MRI LUMBAR SPINE WITHOUT CONTRAST
TECHNIQUE: Multiplanar, multisequence MR imaging of the lumbar spine was
performed. No intravenous contrast was administered.

[Series 3: T2 · sagittal · 4.0mm · 0.70mm/px · 6 of 15 slices shown (1 of 2)]
[im 1/15]
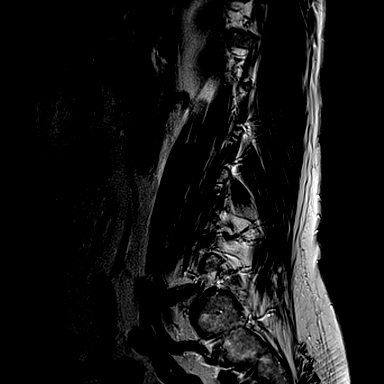
[im 3/15]
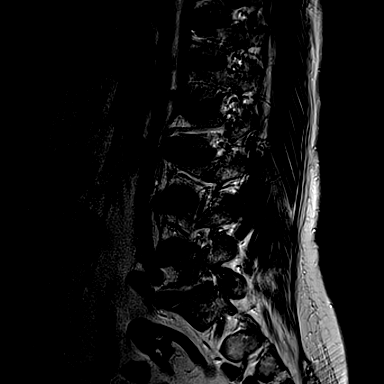
[im 6/15]
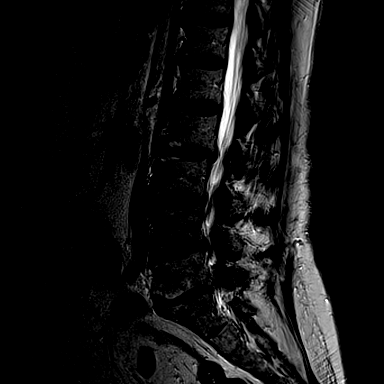
[im 9/15]
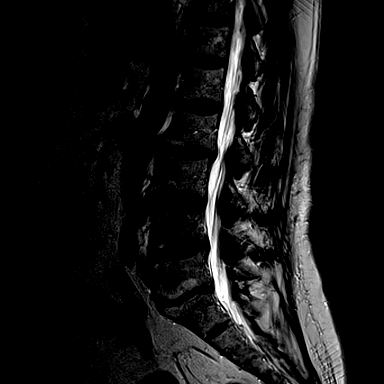
[im 12/15]
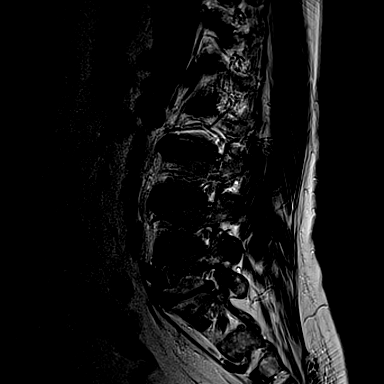
[im 15/15]
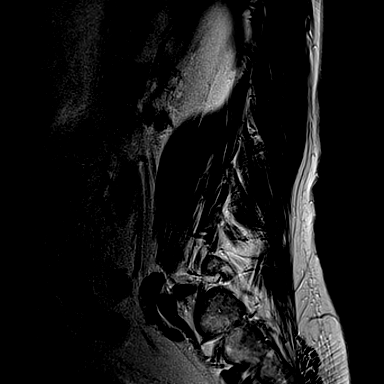

[Series 4: T1 · sagittal · 4.0mm · 0.38mm/px · 3 of 15 slices shown (1 of 2)]
[im 3/15]
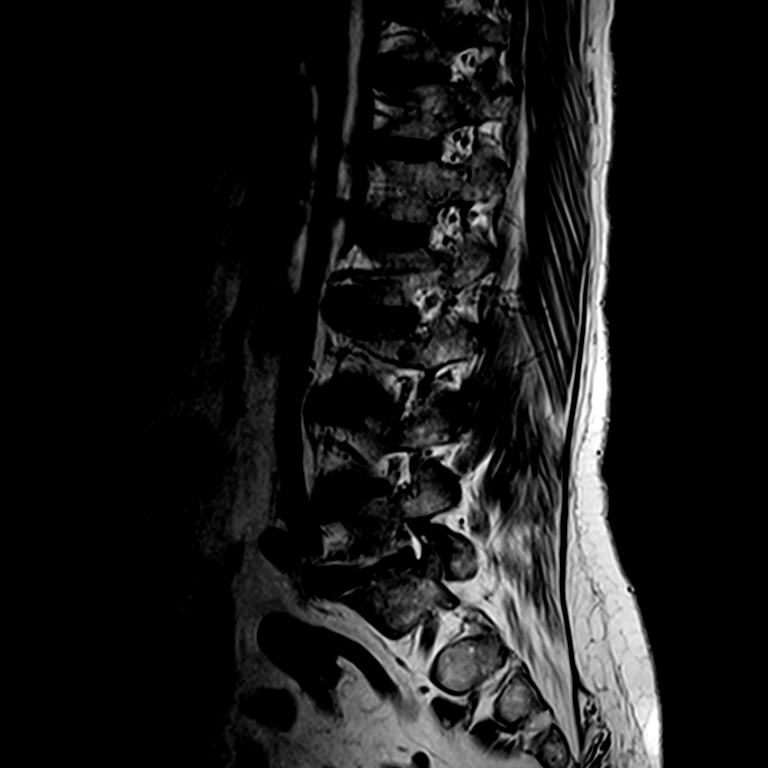
[im 9/15]
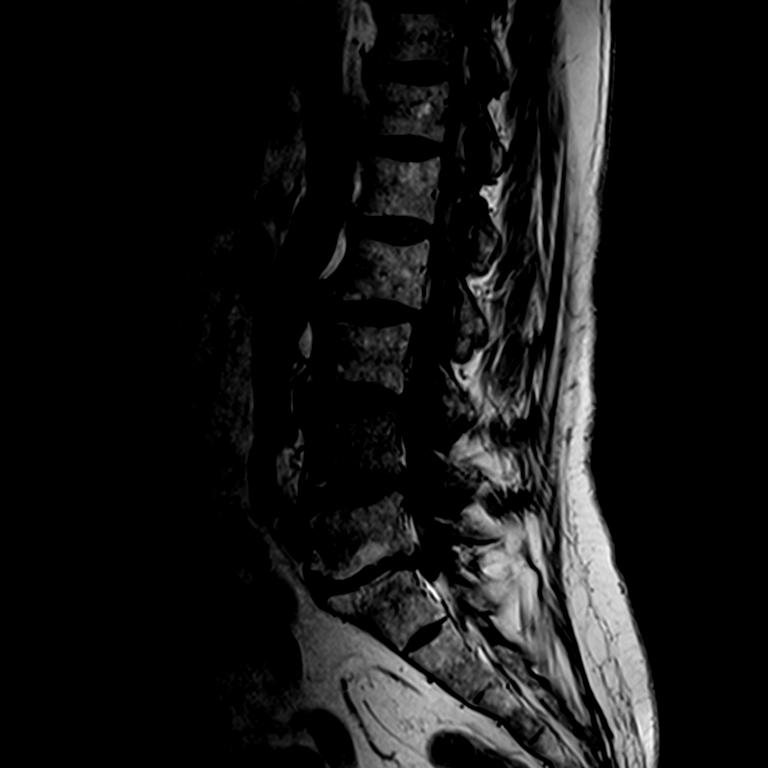
[im 15/15]
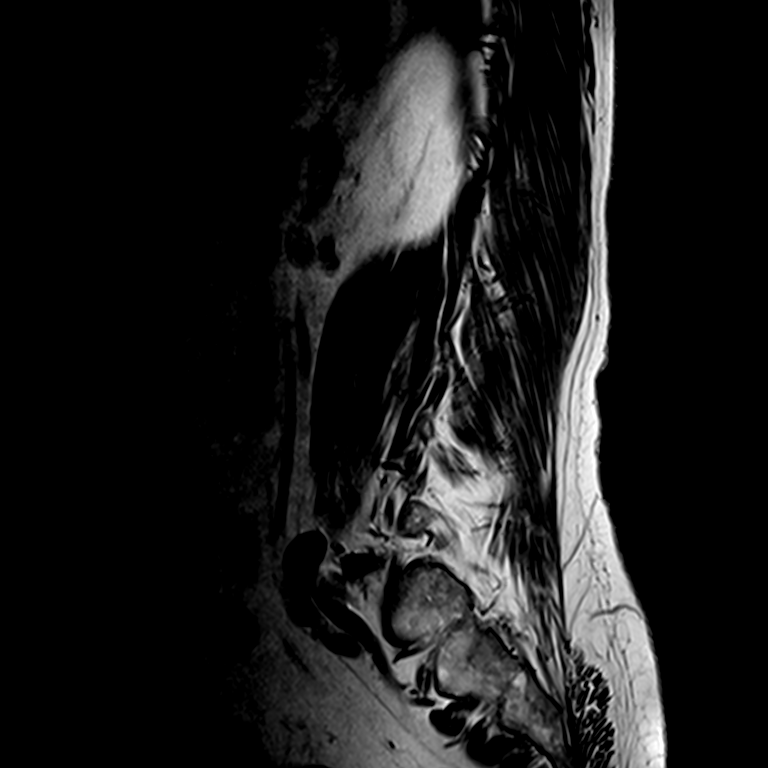

[Series 6: T2 · axial · 4.0mm · 0.23mm/px · z∈[-91,+35]mm · 3 of 36 slices shown (2 of 2)]
[im 6/36]
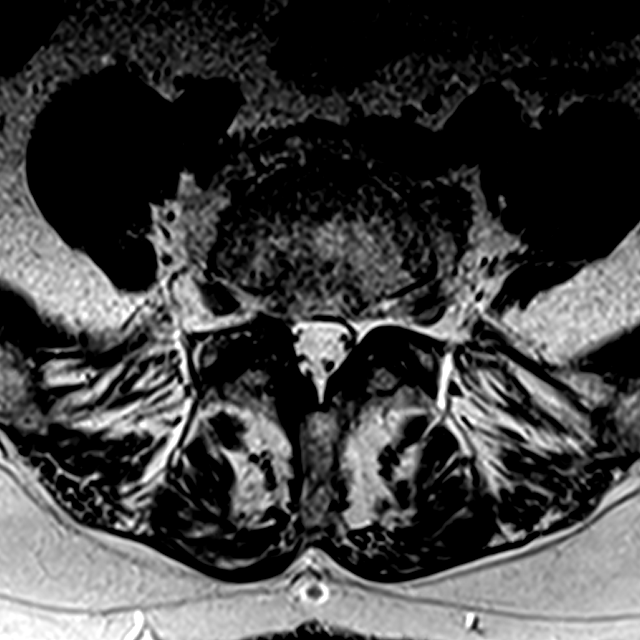
[im 18/36]
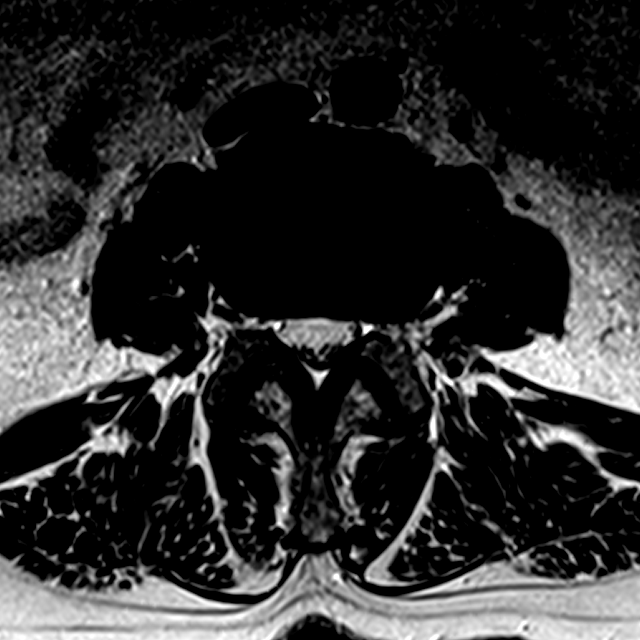
[im 31/36]
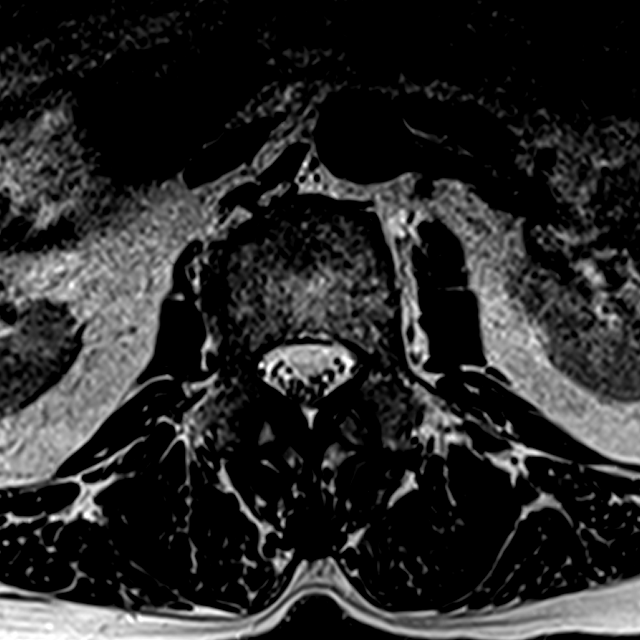

[Series 7: T1 · axial · 4.0mm · 0.24mm/px · z∈[-92,+35]mm · 3 of 36 slices shown (2 of 2)]
[im 6/36]
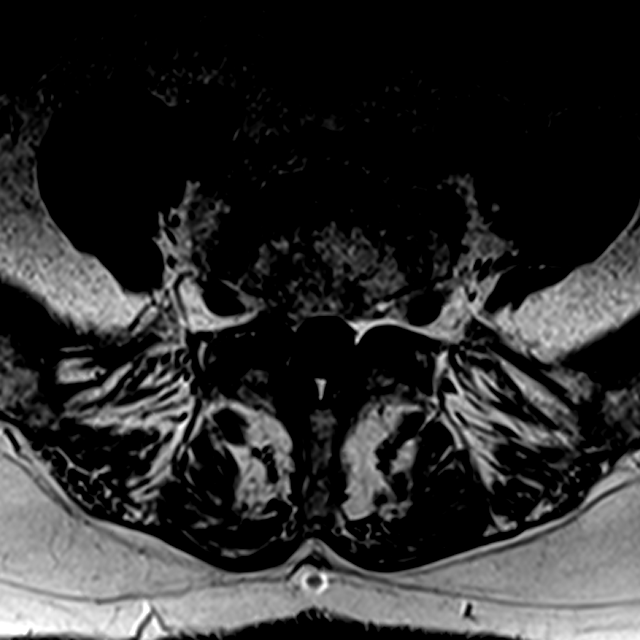
[im 18/36]
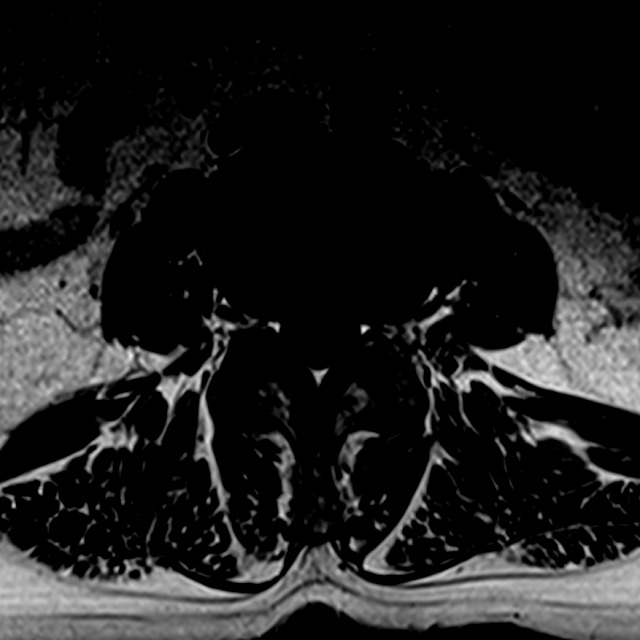
[im 31/36]
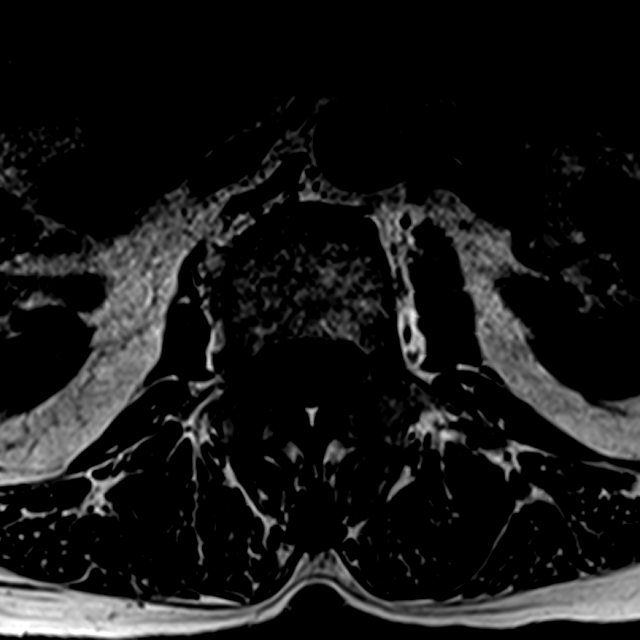

[15 of 48 positions shown; findings below may reference images not displayed]

FINDINGS: Segmentation: Conventional anatomy assumed, with the last open disc
space designated L5-S1.

Alignment: There is a new grade 1 retrolisthesis at L2-3 with
associated endplate degeneration. Endplate degenerative changes and
a grade 1 retrolisthesis at L5-S1 are unchanged.

Bones: No worrisome osseous lesion. No evidence of fracture or pars
defect. Chronic endplate degenerative changes at L5-S1 are stable.

Conus medullaris: Extends to the L1 level and appears normal.

Paraspinal and other soft tissues: No evidence for hydronephrosis or
paravertebral mass.

Disc levels:

No significant disc space findings from T11-12 through L1-2.

L2-3: Interval loss of disc height with annular disc bulging and
endplate degeneration. Mild facet and ligamentous hypertrophy
contribute to borderline spinal stenosis. The lateral recesses and
foramina remain sufficiently patent.

L3-4: Disc height is relatively maintained. There is progressive
annular disc bulging eccentric to the left with mild facet and
ligamentous hypertrophy. There is borderline spinal stenosis, but no
nerve root encroachment.

L4-5: Stable loss of disc height with annular disc bulging. There is
mildly progressive facet and ligamentous hypertrophy contributing to
mild narrowing of the lateral recesses. The foramina remain patent.

L5-S1: Chronic right-sided laminectomy defect noted. There is stable
chronic degenerative disc disease with loss of disc height,
paraspinal osteophytes and bilateral facet hypertrophy. Mild
foraminal narrowing bilaterally appears unchanged.
IMPRESSION: 1. Compared with the previous MRI from 7669, there is mildly
progressive multilevel spondylosis, especially at L2-3. At that
level, the patient has developed moderate disc bulging and endplate
degeneration, but no significant spinal stenosis or nerve root
encroachment.
2. No acute findings or clear explanation for right radicular
symptoms.
3. Stable chronic degenerative and postsurgical changes at L5-S1.

## 2017-04-19 ENCOUNTER — Ambulatory Visit (INDEPENDENT_AMBULATORY_CARE_PROVIDER_SITE_OTHER): Payer: Medicare Other | Admitting: Adult Health

## 2017-04-19 ENCOUNTER — Encounter: Payer: Self-pay | Admitting: Adult Health

## 2017-04-19 VITALS — BP 128/70 | HR 83 | Ht 65.0 in | Wt 221.0 lb

## 2017-04-19 DIAGNOSIS — L02213 Cutaneous abscess of chest wall: Secondary | ICD-10-CM | POA: Diagnosis not present

## 2017-04-19 MED ORDER — AMOXICILLIN-POT CLAVULANATE 875-125 MG PO TABS: 1.0000 | ORAL_TABLET | Freq: Two times a day (BID) | ORAL | 0 refills | Status: DC

## 2017-04-19 NOTE — Patient Instructions (Addendum)
appt 10/2 at 1 pm with Dr Lovell Sheehan F/U in 8 weeks for physical

## 2017-04-19 NOTE — Progress Notes (Signed)
Subjective:     Patient ID: [name], female   DOB: [birth year], 66 y.o.   MRN: [id number]  HPI [name] is a 66 year old white female, in complaining of painful knot on chest between breasts.Has had area for years but in last day or so, was painful and draining, with odor. PCP is Dr Sudie Bailey.  Review of Systems Painful knot on chest Reviewed past medical,surgical, social and family history. Reviewed medications and allergies.     Objective:   Physical Exam BP 128/70 (BP Location: Left Arm, Patient Position: Sitting, Cuff Size: Large)   Pulse 83   Ht  (1.651 m)   Wt 221 lb (100.2 kg)   BMI 36.78 kg/m    Skin warm and dry, has 5 x 6 cm area between breasts, red and swollen, that drains greenish, cheesy material, with odor from several sites when pressed, culture obtained, probable  infected sebaceous cyst, will rx antibiotics and refer to Dr Lovell Sheehan to evaluate.  Assessment:     Abscess, chest    Plan:     Meds ordered this encounter  Medications  . amoxicillin-clavulanate (AUGMENTIN) 875-125 MG tablet    Sig: Take 1 tablet by mouth 2 (two) times daily.    Dispense:  20 tablet    Refill:  0    Order Specific Question:   Supervising Provider    Answer:   Lazaro Arms [[id number]]  Culture sent on wound Referred to Dr Lovell Sheehan 10/2 at 1 pm  Return in 8 weeks for physical

## 2017-04-19 NOTE — Addendum Note (Signed)
Addended by: Colen Darling on: 04/19/2017 12:47 PM   Modules accepted: Orders

## 2017-04-22 LAB — WOUND CULTURE

## 2017-04-24 ENCOUNTER — Telehealth: Payer: Self-pay | Admitting: Adult Health

## 2017-04-24 MED ORDER — CIPROFLOXACIN HCL 500 MG PO TABS: 500.0000 mg | ORAL_TABLET | Freq: Two times a day (BID) | ORAL | 0 refills | Status: DC

## 2017-04-24 NOTE — Telephone Encounter (Signed)
Left message that culture back and will add cipro,but keep taking augmentin

## 2017-04-25 ENCOUNTER — Encounter: Payer: Self-pay | Admitting: General Surgery

## 2017-04-25 ENCOUNTER — Ambulatory Visit (INDEPENDENT_AMBULATORY_CARE_PROVIDER_SITE_OTHER): Payer: Medicare Other | Admitting: General Surgery

## 2017-04-25 VITALS — BP 144/82 | HR 86 | Temp 98.7°F | Resp 18 | Ht 65.0 in | Wt 222.0 lb

## 2017-04-25 DIAGNOSIS — L723 Sebaceous cyst: Secondary | ICD-10-CM | POA: Diagnosis not present

## 2017-04-25 NOTE — Patient Instructions (Signed)
Epidermal Cyst An epidermal cyst is a small, painless lump under your skin. It may be called an epidermal inclusion cyst or an infundibular cyst. The cyst contains a grayish-white, bad-smelling substance (keratin). It is important not to pop epidermal cysts yourself. These cysts are usually harmless (benign), but they can get infected. Symptoms of infection may include:  Redness.  Inflammation.  Tenderness.  Warmth.  Fever.  A grayish-white, bad-smelling substance draining from the cyst.  Pus draining from the cyst.  Follow these instructions at home:  Take over-the-counter and prescription medicines only as told by your doctor.  If you were prescribed an antibiotic, use it as told by your doctor. Do not stop using the antibiotic even if you start to feel better.  Keep the area around your cyst clean and dry.  Wear loose, dry clothing.  Do not try to pop your cyst.  Avoid touching your cyst.  Check your cyst every day for signs of infection.  Keep all follow-up visits as told by your doctor. This is important. How is this prevented?  Wear clean, dry, clothing.  Avoid wearing tight clothing.  Keep your skin clean and dry. Shower or take baths every day.  Wash your body with a benzoyl peroxide wash when you shower or bathe. Contact a health care provider if:  Your cyst has symptoms of infection.  Your condition is not improving or is getting worse.  You have a cyst that looks different from other cysts you have had.  You have a fever. Get help right away if:  Redness spreads from the cyst into the surrounding area. This information is not intended to replace advice given to you by your health care provider. Make sure you discuss any questions you have with your health care provider. Document Released: 08/18/2004 Document Revised: 03/09/2016 Document Reviewed: 05/13/2015 Elsevier Interactive Patient Education  2018 Elsevier Inc.  

## 2017-04-25 NOTE — Progress Notes (Signed)
[name]; [id number]; 1951-02-10   HPI Patient is a 66 year old white female who was referred to my care by Cyril Mour[facility] for evaluation treatment of a cyst along the anterior chest wall between her breasts. The patient states it didn't present for 3-4 years, but recently increased in size. She was started on antibiotic and yesterday, it started to drain a white chunky debris. She was able to express most of it out and cleaned with alcohol. She denies any fevers. She is still taking her antibiotics. She currently has no pain. She is scheduled to get a mammogram in the near future. Past Medical History:  Diagnosis Date  . Anxiety    panic attack  . Arthritis   . Carpal tunnel syndrome on both sides   . Complication of anesthesia    1st knee replacement slow to awaken, 2nd knee replacement - woke up had to have more anesthesia  . High cholesterol   . History of kidney stones   . Knee joint replacement status   . Shortness of breath dyspnea    with exertion    Past Surgical History:  Procedure Laterality Date  . APPENDECTOMY    . BREAST CYST EXCISION    . BUNIONECTOMY  09/29/2011   RIGHT  . CARPAL TUNNEL RELEASE  04/13/2012   rocedure: CARPAL TUNNEL RELEASE;  Surgeon: Vickki Hearing, MD;  Location: [facility];  Service: Orthopedics;  Laterality: Right;  . COLONOSCOPY N/A 09/11/2015   Procedure: COLONOSCOPY;  Surgeon: [place] Bali, MD;  [facility];  Service: Endoscopy;  Laterality: N/A;  1000  . JOINT REPLACEMENT Bilateral 2008   bilateral knees, [facility]; [place]  . KNEE SURGERY    . LAMINECTOMY    . ORIF HUMERUS FRACTURE Right 01/11/2016   Procedure: OPEN REDUCTION INTERNAL FIXATION (ORIF) RIGHT PROXIMAL HUMERUS FRACTURE;  Surgeon: [name], MD;  Location: [facility];  Service: Orthopedics;  Laterality: Right;    Family History  Problem Relation Age of Onset  . Dementia Mother   . Arthritis Mother   . Hypertension Mother   . Colon  polyps Mother   . Cancer Father        prostate, lung  . COPD Father   . Heart disease Father   . Colon polyps Father   . Congestive Heart Failure Father   . Hyperlipidemia Sister   . Colon polyps Sister   . Hyperlipidemia Brother   . Colon polyps Brother   . Birth defects Son   . Early death Son   . Congestive Heart Failure Paternal Aunt   . Cancer Maternal Grandmother        lung  . Diabetes Maternal Grandfather   . Heart attack Maternal Grandfather   . Heart disease Paternal Grandmother   . Stroke Paternal Grandmother   . Congestive Heart Failure Paternal Grandmother   . Cancer Paternal Grandfather        lung  . Cancer Brother        liver  . Diabetes Brother   . Irritable bowel syndrome Daughter   . Congestive Heart Failure Paternal Aunt   . Anesthesia problems Neg Hx   . Hypotension Neg Hx   . Malignant hyperthermia Neg Hx   . Pseudochol deficiency Neg Hx     Current Outpatient Prescriptions on File Prior to Visit  Medication Sig Dispense Refill  . ALPRAZolam (XANAX) 1 MG tablet Take 1 mg by mouth at bedtime as needed for  sleep.     . amoxicillin-clavulanate (AUGMENTIN) 875-125 MG tablet Take 1 tablet by mouth 2 (two) times daily. 20 tablet 0  . aspirin EC 81 MG tablet Take 81 mg by mouth daily.    . ciprofloxacin (CIPRO) 500 MG tablet Take 1 tablet (500 mg total) by mouth 2 (two) times daily. 14 tablet 0  . docusate sodium (COLACE) 100 MG capsule Take 100 mg by mouth 2 (two) times daily.    . Multiple Vitamin (MULTIVITAMIN) capsule Take 1 capsule by mouth daily.    . simvastatin (ZOCOR) 40 MG tablet Take 40 mg by mouth every evening.     No current facility-administered medications on file prior to visit.     Allergies  Allergen Reactions  . Gabapentin Rash  . Oxycodone Itching and Rash    History  Alcohol Use  . 1.2 oz/week  . 2 Glasses of wine per week    Comment: occasional wine    History  Smoking Status  . Former Smoker  . Years: 30.00  .  Types: Cigarettes  . Quit date: 11/26/2010  Smokeless Tobacco  . Never Used    Review of Systems  Constitutional: Negative.   HENT: Negative.   Eyes: Negative.   Respiratory: Negative.   Cardiovascular: Negative.   Gastrointestinal: Negative.   Genitourinary: Negative.   Musculoskeletal: Positive for joint pain.  Skin: Negative.   Neurological: Negative.   Endo/Heme/Allergies: Negative.   Psychiatric/Behavioral: Negative.     Objective   Vitals:   04/25/17 1323  BP: (!) 144/82  Pulse: 86  Resp: 18  Temp: 98.7 F (37.1 C)    Physical Exam  Constitutional: She is oriented to person, place, and time and well-developed, well-nourished, and in no distress.  HENT:  Head: Normocephalic and atraumatic.  Cardiovascular: Normal rate, regular rhythm and normal heart sounds.  Exam reveals no gallop and no friction rub.   No murmur heard. Pulmonary/Chest: Effort normal and breath sounds normal. No respiratory distress. She has no wheezes. She has no rales.  Neurological: She is alert and oriented to person, place, and time.  Skin: Skin is warm and dry.  5 mm punctate hole in the skin in the mid anterior chest wall sternal region between the breasts. No active drainage noted. Minimal erythema was noted. No induration is noted. This was cleaned with Q-tip and peroxide.  Vitals reviewed.   Assessment  Sebaceous cyst, chest wall, resolved Plan   No need for further surgical intervention at this time. Patient will finish antibiotic course. She will keep the wound clean and dry. She will return should she have any problems with the cyst. Literature was given. Follow-up as needed.

## 2017-05-19 ENCOUNTER — Other Ambulatory Visit (HOSPITAL_COMMUNITY): Payer: Self-pay | Admitting: Family Medicine

## 2017-05-19 DIAGNOSIS — Z1231 Encounter for screening mammogram for malignant neoplasm of breast: Secondary | ICD-10-CM

## 2017-05-26 ENCOUNTER — Ambulatory Visit (HOSPITAL_COMMUNITY)
Admission: RE | Admit: 2017-05-26 | Discharge: 2017-05-26 | Disposition: A | Discharge status: Home / Self Care | Payer: Medicare Other | Source: Ambulatory Visit | Attending: Family Medicine | Admitting: Family Medicine

## 2017-05-26 ENCOUNTER — Encounter (HOSPITAL_COMMUNITY): Payer: Self-pay

## 2017-05-26 DIAGNOSIS — Z1231 Encounter for screening mammogram for malignant neoplasm of breast: Secondary | ICD-10-CM | POA: Diagnosis present

## 2017-06-20 ENCOUNTER — Ambulatory Visit: Payer: Medicare Other | Admitting: Adult Health

## 2018-01-18 ENCOUNTER — Other Ambulatory Visit (HOSPITAL_COMMUNITY): Payer: Self-pay | Admitting: Family Medicine

## 2018-01-18 ENCOUNTER — Ambulatory Visit (HOSPITAL_COMMUNITY)
Admission: RE | Admit: 2018-01-18 | Discharge: 2018-01-18 | Disposition: A | Discharge status: Home / Self Care | Payer: Medicare Other | Source: Ambulatory Visit | Attending: Family Medicine | Admitting: Family Medicine

## 2018-01-18 DIAGNOSIS — R0602 Shortness of breath: Secondary | ICD-10-CM

## 2018-01-18 DIAGNOSIS — R079 Chest pain, unspecified: Secondary | ICD-10-CM | POA: Insufficient documentation

## 2018-02-02 ENCOUNTER — Ambulatory Visit: Payer: Medicare Other | Admitting: Cardiovascular Disease

## 2018-02-02 ENCOUNTER — Encounter: Payer: Self-pay | Admitting: Cardiovascular Disease

## 2018-02-02 VITALS — BP 148/76 | HR 75 | Ht 65.0 in | Wt 234.0 lb

## 2018-02-02 DIAGNOSIS — E7800 Pure hypercholesterolemia, unspecified: Secondary | ICD-10-CM | POA: Diagnosis not present

## 2018-02-02 DIAGNOSIS — I208 Other forms of angina pectoris: Secondary | ICD-10-CM | POA: Diagnosis not present

## 2018-02-02 DIAGNOSIS — R072 Precordial pain: Secondary | ICD-10-CM

## 2018-02-02 DIAGNOSIS — R079 Chest pain, unspecified: Secondary | ICD-10-CM | POA: Insufficient documentation

## 2018-02-02 DIAGNOSIS — E785 Hyperlipidemia, unspecified: Secondary | ICD-10-CM | POA: Insufficient documentation

## 2018-02-02 NOTE — Assessment & Plan Note (Signed)
Recent episode of atypical chest pain approximate month ago that lasted for several minutes associated with some shortness of breath.  She said no recurrent symptoms.  Risk factors include close to 80 pack years of tobacco abuse having quit 10 years ago and treated hyperlipidemia as well as family history.  She has had a normal cath in the past and stress test as well.  At this point I am going to treat her conservatively and will reserve functional testing for recurrent symptoms.

## 2018-02-02 NOTE — Progress Notes (Signed)
02/02/2018 Audrey FinchBarbara K Marquez   1950/12/17  [id number]  Primary Physician Gareth MorganKnowlton, Steve, MD Primary Cardiologist: Runell GessJonathan J Allyanna Appleman MD Nicholes CalamityFACP, FACC, FAHA, [place]  HPI:  Audrey FinchBarbara K Marquez is a 67 y.o. fairly overweight married Caucasian female mother of 3 daughters, grandmother of 5 grandchildren who was referred by Dr. Sudie BaileyKnowlton in [place] for cardiovascular evaluation because of chest pain.  Currently on disability for various orthopedic issues including knee and back issues.  She previously was taking care of by Dr. Alanda AmassWeintraub.  Risk factors include 80 pack years of tobacco abuse having quit 10 years ago, treated hyperlipidemia and family history.  Father did have CAD.  Both her father and mother, Jesusita OkaDan and Derrill Centernne Kernodle, were patients of mine as well.  She has had a normal cath remotely and stress test as well.  She had an episode of atypical chest pain a month ago the last for several minutes associated with shortness of breath but none since.   Current Meds  Medication Sig  . ALPRAZolam (XANAX) 1 MG tablet Take 1 mg by mouth at bedtime as needed for sleep.   Marland Kitchen. aspirin EC 81 MG tablet Take 81 mg by mouth daily.  Marland Kitchen. docusate sodium (COLACE) 100 MG capsule Take 100 mg by mouth 2 (two) times daily.  . Multiple Vitamin (MULTIVITAMIN) capsule Take 1 capsule by mouth daily.  . simvastatin (ZOCOR) 40 MG tablet Take 40 mg by mouth every evening.     Allergies  Allergen Reactions  . Gabapentin Rash  . Oxycodone Itching and Rash    Social History   Socioeconomic History  . Marital status: Married    Spouse name: Not on file  . Number of children: Not on file  . Years of education: Not on file  . Highest education level: Not on file  Occupational History  . Not on file  Social Needs  . Financial resource strain: Not on file  . Food insecurity:    Worry: Not on file    Inability: Not on file  . Transportation needs:    Medical: Not on file    Non-medical: Not on file  Tobacco Use  .  Smoking status: Former Smoker    Years: 30.00    Types: Cigarettes    Last attempt to quit: 11/26/2010    Years since quitting: 7.1  . Smokeless tobacco: Never Used  Substance and Sexual Activity  . Alcohol use: Yes    Alcohol/week: 1.2 oz    Types: 2 Glasses of wine per week    Comment: occasional wine  . Drug use: No  . Sexual activity: Not Currently    Birth control/protection: Post-menopausal  Lifestyle  . Physical activity:    Days per week: Not on file    Minutes per session: Not on file  . Stress: Not on file  Relationships  . Social connections:    Talks on phone: Not on file    Gets together: Not on file    Attends religious service: Not on file    Active member of club or organization: Not on file    Attends meetings of clubs or organizations: Not on file    Relationship status: Not on file  . Intimate partner violence:    Fear of current or ex partner: Not on file    Emotionally abused: Not on file    Physically abused: Not on file    Forced sexual activity: Not on file  Other Topics Concern  .  Not on file  Social History Narrative   RAISED KID. THEN WORKED FOR 12 YRS. TROUBLE WITH HIPS AND KNEES TOOK HER OUT OF WORK DAUGHTER WORKS FOR [employer]. ANOTHER DAUGHTER STUDYING TO BE A NURSE.     Review of Systems: General: negative for chills, fever, night sweats or weight changes.  Cardiovascular: negative for chest pain, dyspnea on exertion, edema, orthopnea, palpitations, paroxysmal nocturnal dyspnea or shortness of breath Dermatological: negative for rash Respiratory: negative for cough or wheezing Urologic: negative for hematuria Abdominal: negative for nausea, vomiting, diarrhea, bright red blood per rectum, melena, or hematemesis Neurologic: negative for visual changes, syncope, or dizziness All other systems reviewed and are otherwise negative except as noted above.    Blood pressure (!) 148/76, pulse 75, height 5\' 5"  (1.651 m), weight 234 lb (106.1  kg).  General appearance: alert and no distress Neck: no adenopathy, no carotid bruit, no JVD, supple, symmetrical, trachea midline and thyroid not enlarged, symmetric, no tenderness/mass/nodules Lungs: clear to auscultation bilaterally Heart: regular rate and rhythm, S1, S2 normal, no murmur, click, rub or gallop Extremities: extremities normal, atraumatic, no cyanosis or edema Pulses: 2+ and symmetric Skin: Skin color, texture, turgor normal. No rashes or lesions Neurologic: Alert and oriented X 3, normal strength and tone. Normal symmetric reflexes. Normal coordination and gait  EKG sinus rhythm at 75 without ST or T wave changes.  I personally reviewed this EKG  ASSESSMENT AND PLAN:   Hyperlipidemia History of hyperlipidemia on statin therapy.  Chest pain Recent episode of atypical chest pain approximate month ago that lasted for several minutes associated with some shortness of breath.  She said no recurrent symptoms.  Risk factors include close to 80 pack years of tobacco abuse having quit 10 years ago and treated hyperlipidemia as well as family history.  She has had a normal cath in the past and stress test as well.  At this point I am going to treat her conservatively and will reserve functional testing for recurrent symptoms.      Runell Gess MD FACP,FACC,FAHA, [facility] 02/02/2018 12:37 PM

## 2018-02-02 NOTE — Assessment & Plan Note (Signed)
History of hyperlipidemia on statin therapy. 

## 2018-02-02 NOTE — Patient Instructions (Signed)
Your physician wants you to follow-up in: ONE YEAR WITH DR BERRY You will receive a reminder letter in the mail two months in advance. If you don't receive a letter, please call our office to schedule the follow-up appointment.  

## 2018-05-02 ENCOUNTER — Other Ambulatory Visit (HOSPITAL_COMMUNITY): Payer: Self-pay | Admitting: Nurse Practitioner

## 2018-05-02 ENCOUNTER — Ambulatory Visit (HOSPITAL_COMMUNITY)
Admission: RE | Admit: 2018-05-02 | Discharge: 2018-05-02 | Disposition: A | Discharge status: Home / Self Care | Payer: Medicare Other | Source: Ambulatory Visit | Attending: Nurse Practitioner | Admitting: Nurse Practitioner

## 2018-05-02 DIAGNOSIS — M25551 Pain in right hip: Secondary | ICD-10-CM

## 2018-05-02 DIAGNOSIS — M47816 Spondylosis without myelopathy or radiculopathy, lumbar region: Secondary | ICD-10-CM | POA: Insufficient documentation

## 2018-05-02 DIAGNOSIS — M5440 Lumbago with sciatica, unspecified side: Secondary | ICD-10-CM | POA: Insufficient documentation

## 2018-05-02 DIAGNOSIS — M47817 Spondylosis without myelopathy or radiculopathy, lumbosacral region: Secondary | ICD-10-CM | POA: Insufficient documentation

## 2018-05-04 ENCOUNTER — Other Ambulatory Visit (HOSPITAL_COMMUNITY): Payer: Self-pay | Admitting: Nurse Practitioner

## 2018-05-04 DIAGNOSIS — M5440 Lumbago with sciatica, unspecified side: Secondary | ICD-10-CM

## 2018-05-08 ENCOUNTER — Other Ambulatory Visit: Payer: Self-pay | Admitting: Obstetrics and Gynecology

## 2018-05-08 DIAGNOSIS — Z1231 Encounter for screening mammogram for malignant neoplasm of breast: Secondary | ICD-10-CM

## 2018-05-09 ENCOUNTER — Ambulatory Visit (HOSPITAL_COMMUNITY): Payer: Medicare Other

## 2018-05-11 ENCOUNTER — Ambulatory Visit (HOSPITAL_COMMUNITY)
Admission: RE | Admit: 2018-05-11 | Discharge: 2018-05-11 | Disposition: A | Discharge status: Home / Self Care | Payer: Medicare Other | Source: Ambulatory Visit | Attending: Nurse Practitioner | Admitting: Nurse Practitioner

## 2018-05-11 DIAGNOSIS — M5440 Lumbago with sciatica, unspecified side: Secondary | ICD-10-CM | POA: Diagnosis not present

## 2018-05-11 DIAGNOSIS — M48061 Spinal stenosis, lumbar region without neurogenic claudication: Secondary | ICD-10-CM | POA: Diagnosis not present

## 2018-05-11 DIAGNOSIS — Z9889 Other specified postprocedural states: Secondary | ICD-10-CM | POA: Diagnosis not present

## 2018-05-17 ENCOUNTER — Other Ambulatory Visit: Payer: Self-pay | Admitting: Nurse Practitioner

## 2018-05-17 DIAGNOSIS — M5440 Lumbago with sciatica, unspecified side: Secondary | ICD-10-CM

## 2018-05-22 ENCOUNTER — Other Ambulatory Visit: Payer: Medicare Other | Admitting: Adult Health

## 2018-05-29 ENCOUNTER — Other Ambulatory Visit: Payer: Medicare Other

## 2018-05-31 ENCOUNTER — Ambulatory Visit
Admission: RE | Admit: 2018-05-31 | Discharge: 2018-05-31 | Disposition: A | Discharge status: Home / Self Care | Payer: Medicare Other | Source: Ambulatory Visit | Attending: Nurse Practitioner | Admitting: Nurse Practitioner

## 2018-05-31 DIAGNOSIS — M5440 Lumbago with sciatica, unspecified side: Secondary | ICD-10-CM

## 2018-05-31 MED ORDER — METHYLPREDNISOLONE ACETATE 40 MG/ML INJ SUSP (RADIOLOG
120.0000 mg | Freq: Once | INTRAMUSCULAR | Status: AC
  Administered 2018-05-31: 120 mg via EPIDURAL

## 2018-05-31 MED ORDER — IOPAMIDOL (ISOVUE-M 200) INJECTION 41%
1.0000 mL | Freq: Once | INTRAMUSCULAR | Status: AC
  Administered 2018-05-31: 1 mL via EPIDURAL

## 2018-05-31 NOTE — Discharge Instructions (Signed)

## 2018-06-01 ENCOUNTER — Ambulatory Visit (HOSPITAL_COMMUNITY)
Admission: RE | Admit: 2018-06-01 | Discharge: 2018-06-01 | Disposition: A | Discharge status: Home / Self Care | Payer: Medicare Other | Source: Ambulatory Visit | Attending: Obstetrics and Gynecology | Admitting: Obstetrics and Gynecology

## 2018-06-01 DIAGNOSIS — Z1231 Encounter for screening mammogram for malignant neoplasm of breast: Secondary | ICD-10-CM | POA: Insufficient documentation

## 2018-07-04 ENCOUNTER — Other Ambulatory Visit: Payer: Medicare Other | Admitting: Adult Health

## 2019-01-30 ENCOUNTER — Ambulatory Visit (INDEPENDENT_AMBULATORY_CARE_PROVIDER_SITE_OTHER): Payer: Medicare Other

## 2019-01-30 ENCOUNTER — Other Ambulatory Visit: Payer: Self-pay

## 2019-01-30 ENCOUNTER — Ambulatory Visit (INDEPENDENT_AMBULATORY_CARE_PROVIDER_SITE_OTHER): Payer: Medicare Other | Admitting: Orthopedic Surgery

## 2019-01-30 VITALS — BP 179/98 | HR 97 | Temp 98.6°F | Ht 65.0 in | Wt 235.0 lb

## 2019-01-30 DIAGNOSIS — M25552 Pain in left hip: Secondary | ICD-10-CM

## 2019-01-30 DIAGNOSIS — M25551 Pain in right hip: Secondary | ICD-10-CM | POA: Diagnosis not present

## 2019-01-30 MED ORDER — MELOXICAM 7.5 MG PO TABS: 7.5000 mg | ORAL_TABLET | Freq: Every day | ORAL | 2 refills | Status: DC

## 2019-01-30 NOTE — Progress Notes (Signed)
[name]  01/30/2019  HISTORY SECTION :  Chief Complaint  Patient presents with  . Hip Pain    Bilateral hip pain.   HPI The patient presents for evaluation of bilateral hip pain  Patient presents with severe bilateral hip, groin  pain for several months.  She has bilateral groin pain left worse than right she also has difficulty standing for long periods of time has trouble sitting on low chairs including driving associated with poor quality of life and trouble with certain activities of daily living such as putting her shoes on.  No prior treatment   Review of Systems  Musculoskeletal: Positive for back pain and joint pain.  Neurological: Positive for tingling.     Past Medical History:  Diagnosis Date  . Anxiety    panic attack  . Arthritis   . Carpal tunnel syndrome on both sides   . Complication of anesthesia    1st knee replacement slow to awaken, 2nd knee replacement - woke up had to have more anesthesia  . High cholesterol   . History of kidney stones   . Knee joint replacement status   . Shortness of breath dyspnea    with exertion    Past Surgical History:  Procedure Laterality Date  . APPENDECTOMY    . BREAST CYST EXCISION    . BUNIONECTOMY  09/29/2011   RIGHT  . CARPAL TUNNEL RELEASE  04/13/2012   rocedure: CARPAL TUNNEL RELEASE;  Surgeon: Carole Civil, MD;  Location: [facility];  Service: Orthopedics;  Laterality: Right;  . COLONOSCOPY N/A 09/11/2015   Procedure: COLONOSCOPY;  Surgeon: Danie Binder, MD;  Location: [facility];  Service: Endoscopy;  Laterality: N/A;  1000  . JOINT REPLACEMENT Bilateral 2008   bilateral knees, [facility]; [facility]  . KNEE SURGERY    . LAMINECTOMY    . ORIF HUMERUS FRACTURE Right 01/11/2016   Procedure: OPEN REDUCTION INTERNAL FIXATION (ORIF) RIGHT PROXIMAL HUMERUS FRACTURE;  Surgeon: Meredith Pel, MD;  Location: [facility];  Service: Orthopedics;  Laterality: Right;     Allergies  Allergen Reactions  .  Gabapentin Rash  . Oxycodone Itching and Rash     Current Outpatient Medications:  .  ALPRAZolam (XANAX) 1 MG tablet, Take 1 mg by mouth at bedtime as needed for sleep. , Disp: , Rfl:  .  aspirin EC 81 MG tablet, Take 81 mg by mouth daily., Disp: , Rfl:  .  docusate sodium (COLACE) 100 MG capsule, Take 100 mg by mouth 2 (two) times daily., Disp: , Rfl:  .  lisinopril (ZESTRIL) 5 MG tablet, Take 5 mg by mouth daily., Disp: , Rfl:  .  methocarbamol (ROBAXIN) 500 MG tablet, Take 500 mg by mouth 3 (three) times daily., Disp: , Rfl:  .  Multiple Vitamin (MULTIVITAMIN) capsule, Take 1 capsule by mouth daily., Disp: , Rfl:  .  simvastatin (ZOCOR) 40 MG tablet, Take 40 mg by mouth every evening., Disp: , Rfl:  .  meloxicam (MOBIC) 7.5 MG tablet, Take 1 tablet (7.5 mg total) by mouth daily., Disp: 30 tablet, Rfl: 2   PHYSICAL EXAM SECTION: 1) BP (!) 179/98   Pulse 97   Temp 98.6 F (37 C)   Ht 5\' 5"  (1.651 m)   Wt 235 lb (106.6 kg)   BMI 39.11 kg/m   Body mass index is 39.11 kg/m. General appearance: Well-developed well-nourished no gross deformities  2) Cardiovascular normal pulse and perfusion in all 4 extremities normal color  without edema  3) Neurologically deep tendon reflexes are equal and normal, no sensation loss or deficits no pathologic reflexes  4) Psychological: Awake alert and oriented x3 mood and affect normal  5) Skin no lacerations or ulcerations no nodularity no palpable masses, no erythema or nodularity  6) Musculoskeletal:   Britta MccreedyBarbara has tenderness in her lower back on both sides as well as the midline she does not come to full extension at the lumbopelvic junction  She does have hip pain with straight leg resistance bilaterally  Her left hip flexion is 90 degrees she has limited internal rotation with pain with flexion and internal rotation muscle tone is normal she had good hip flexor strength skin was intact pulses were good temperature normal there was no edema  and she had normal sensation  On the right hip flexion was 110 degrees she had better abduction and internal rotation she did have normal strength on hip flexion skin was normal neurovascular exam is intact   MEDICAL DECISION SECTION:  Encounter Diagnoses  Name Primary?  . Pain in left hip Yes  . Pain in right hip    Meds ordered this encounter  Medications  . meloxicam (MOBIC) 7.5 MG tablet    Sig: Take 1 tablet (7.5 mg total) by mouth daily.    Dispense:  30 tablet    Refill:  2     Imaging [facility] pelvis and AP lateral each hip she has no narrowing in the left hip moderate narrowing in the right hip mild she has peripheral osteophytes around the acetabulum no head deformity see report  Plan:  (Rx., Inj., surg., Frx, MRI/CT, XR:2)  Bilateral hip pain  She has an MRI which shows she has evidence of spinal stenosis, she was to see Dr. Newell CoralNudelman but her appointment may have been postponed or forgotten during the COVID-19 initial shutdown  That will need to be done again and as usual we prefer back surgery before hip surgery if back surgery is needed  I started her on meloxicam and told her to use a cane follow-up repeat x-rays in 6 months  3:14 PM

## 2019-04-23 ENCOUNTER — Other Ambulatory Visit: Payer: Self-pay | Admitting: Orthopedic Surgery

## 2019-04-23 DIAGNOSIS — M25552 Pain in left hip: Secondary | ICD-10-CM

## 2019-04-23 DIAGNOSIS — M25551 Pain in right hip: Secondary | ICD-10-CM

## 2019-04-30 ENCOUNTER — Other Ambulatory Visit (HOSPITAL_COMMUNITY): Payer: Self-pay | Admitting: Obstetrics and Gynecology

## 2019-04-30 DIAGNOSIS — Z1231 Encounter for screening mammogram for malignant neoplasm of breast: Secondary | ICD-10-CM

## 2019-06-05 ENCOUNTER — Ambulatory Visit (HOSPITAL_COMMUNITY): Payer: Medicare Other

## 2019-06-28 ENCOUNTER — Ambulatory Visit (HOSPITAL_COMMUNITY)
Admission: RE | Admit: 2019-06-28 | Discharge: 2019-06-28 | Disposition: A | Discharge status: Home / Self Care | Payer: Medicare Other | Source: Ambulatory Visit | Attending: Obstetrics and Gynecology | Admitting: Obstetrics and Gynecology

## 2019-06-28 ENCOUNTER — Other Ambulatory Visit: Payer: Self-pay

## 2019-06-28 DIAGNOSIS — Z1231 Encounter for screening mammogram for malignant neoplasm of breast: Secondary | ICD-10-CM | POA: Insufficient documentation

## 2019-07-09 ENCOUNTER — Other Ambulatory Visit: Payer: Self-pay | Admitting: Orthopedic Surgery

## 2019-07-09 DIAGNOSIS — M25551 Pain in right hip: Secondary | ICD-10-CM

## 2019-07-09 DIAGNOSIS — M25552 Pain in left hip: Secondary | ICD-10-CM

## 2019-07-26 HISTORY — PX: OTHER SURGICAL HISTORY: SHX169

## 2019-08-05 ENCOUNTER — Other Ambulatory Visit: Payer: Self-pay

## 2019-08-05 ENCOUNTER — Ambulatory Visit: Payer: Medicare Other

## 2019-08-05 ENCOUNTER — Ambulatory Visit: Payer: Medicare Other | Admitting: Orthopedic Surgery

## 2019-08-05 VITALS — BP 140/79 | HR 85 | Temp 97.0°F | Ht 65.0 in | Wt 227.0 lb

## 2019-08-05 DIAGNOSIS — M1610 Unilateral primary osteoarthritis, unspecified hip: Secondary | ICD-10-CM

## 2019-08-05 DIAGNOSIS — M1612 Unilateral primary osteoarthritis, left hip: Secondary | ICD-10-CM | POA: Diagnosis not present

## 2019-08-05 DIAGNOSIS — M1611 Unilateral primary osteoarthritis, right hip: Secondary | ICD-10-CM | POA: Diagnosis not present

## 2019-08-05 NOTE — Progress Notes (Signed)
Chief Complaint  Patient presents with  . Hip Pain    6 month recheck on bilateral hip pain.    69 year old female with chronic hip pain here for routine follow-up  The right hip is doing really well the left hip is still painful and she is having trouble with bathing and reaching down to get her shoes and socks on her wash her feet  She is noting that overall though she is better with meloxicam now that she takes it on a daily basis    Past Medical History:  Diagnosis Date  . Anxiety    panic attack  . Arthritis   . Carpal tunnel syndrome on both sides   . Complication of anesthesia    1st knee replacement slow to awaken, 2nd knee replacement - woke up had to have more anesthesia  . High cholesterol   . History of kidney stones   . Knee joint replacement status   . Shortness of breath dyspnea    with exertion    BP 140/79   Pulse 85   Temp (!) 97 F (36.1 C)   Ht 5\' 5"  (1.651 m)   Wt 227 lb (103 kg)   BMI 37.77 kg/m   She is awake alert and oriented x3  Mood and affect are normal.  Appearance is normal with good grooming and hygiene  Leg lengths are equal  Right hip flexion 120 degrees limited internal and external rotation abduction and adduction with no instability  Left hip flexion 90 degrees with pain limited internal/external rotation and abduction and abduction with no instability  Gait shows no limp  Bilateral hip x-rays  And pelvis  See dictated report  Left hip shows progression of narrowing of the superolateral weightbearing area right hip is stable   Encounter Diagnoses  Name Primary?  . Hip arthritis Yes  . Arthritis of right hip   . Arthritis of left hip    To chronic illnesses well with exacerbation 1 stable  Impression overall stable bilateral hip arthritis  Continue meloxicam (prescription drug management)  Follow-up in 6 months repeat x-rays

## 2019-08-05 NOTE — Patient Instructions (Signed)
Continue meloxicam   Continue weight loss   F/u 6 months

## 2019-09-11 ENCOUNTER — Telehealth: Payer: Self-pay | Admitting: *Deleted

## 2019-09-11 NOTE — Telephone Encounter (Signed)
Called patient to see if she wanted to have a virtual visit on 09-12-19 due to bad weather and patient said that she wanted to reschedule. I told her that someone would get back with her to get that appointment rescheduled.

## 2019-09-12 ENCOUNTER — Ambulatory Visit: Payer: Medicare Other | Admitting: Cardiovascular Disease

## 2019-09-27 ENCOUNTER — Encounter: Payer: Self-pay | Admitting: Cardiovascular Disease

## 2019-09-27 ENCOUNTER — Ambulatory Visit: Payer: Medicare Other | Admitting: Cardiovascular Disease

## 2019-09-27 ENCOUNTER — Other Ambulatory Visit: Payer: Self-pay

## 2019-09-27 DIAGNOSIS — E782 Mixed hyperlipidemia: Secondary | ICD-10-CM

## 2019-09-27 DIAGNOSIS — I208 Other forms of angina pectoris: Secondary | ICD-10-CM

## 2019-09-27 DIAGNOSIS — R0602 Shortness of breath: Secondary | ICD-10-CM | POA: Diagnosis not present

## 2019-09-27 NOTE — Assessment & Plan Note (Signed)
History of hyperlipidemia on statin therapy followed by her PCP. 

## 2019-09-27 NOTE — Assessment & Plan Note (Signed)
History of atypical chest pain in the past with a remote normal cath and stress test.  She currently is not complaining of chest pain

## 2019-09-27 NOTE — Assessment & Plan Note (Signed)
[name] developed fatigue and shortness of breath on February 6.  The fatigue is resolved but she still has dyspnea on exertion.  I am going to get a 2D echo and a Lexiscan Myoview to further evaluate.

## 2019-09-27 NOTE — Patient Instructions (Signed)
Medication Instructions:  Your physician recommends that you continue on your current medications as directed. Please refer to the Current Medication list given to you today.  If you need a refill on your cardiac medications before your next appointment, please call your pharmacy.   Lab work: NONE  Testing/Procedures: Your physician has requested that you have an echocardiogram. Echocardiography is a painless test that uses sound waves to create images of your heart. It provides your doctor with information about the size and shape of your heart and how well your heart's chambers and valves are working. This procedure takes approximately one hour. There are no restrictions for this procedure. [address]  AND  Your physician has requested that you have a lexiscan myoview. For further information please visit [email or web address] Please follow instruction sheet, as given.  Follow-Up: At [facility], you and your health needs are our priority.  As part of our continuing mission to provide you with exceptional heart care, we have created designated Provider Care Teams.  These Care Teams include your primary Cardiologist (physician) and Advanced Practice Providers (APPs -  Physician Assistants and Nurse Practitioners) who all work together to provide you with the care you need, when you need it. You may see Dr. Allyson Sabal or one of the following Advanced Practice Providers on your designated Care Team:    Corine Shelter, PA-C  [place], [place]  Edd Fabian, [place]  Your physician wants you to follow-up in: 6 months with a physicians assistant and 1 year with Dr. Allyson Sabal. You will receive a reminder letter in the mail two months in advance. If you don't receive a letter, please call our office to schedule the follow-up appointment.

## 2019-09-27 NOTE — Progress Notes (Signed)
09/27/2019 [name]   [birth year]  [id number]  Primary Physician Audrey Morgan, MD Primary Cardiologist: Audrey Gess MD [name]  HPI:  [name] is a 69 y.o. moderately overweight married Caucasian female mother of 3 daughters, grandmother of 5 grandchildren who was referred by Dr. Sudie Marquez in [place] for cardiovascular evaluation because of chest pain.  I last saw her in the office 02/02/2018. Currently on disability for various orthopedic issues including knee and back issues.  She previously was taking care of by [name].  Risk factors include 80 pack years of tobacco abuse having quit 10 years ago, treated hyperlipidemia and family history.  Father did have CAD.  Both her father and mother, [name] and [name], were patients of mine as well.  She has had a normal cath remotely and stress test as well.  She had an episode of atypical chest pain a month ago the last for several minutes associated with shortness of breath but none since.  Since I saw her a year and a half ago she is done well until August 31, 2019 when she developed new onset dyspnea on exertion/shortness of breath and fatigue.  The fatigue is since resolved but she continues to be dyspneic.  She denies chest pain.  She has 2 daughters that are nurses who have measured her oxygen saturation in the mid to low 80s.  She does have a long history tobacco abuse having quit 10 to 12 years ago.    Current Meds  Medication Sig  . ALPRAZolam (XANAX) 1 MG tablet Take 1 mg by mouth at bedtime as needed for sleep.   Marland Kitchen aspirin EC 81 MG tablet Take 81 mg by mouth daily.  Marland Kitchen docusate sodium (COLACE) 100 MG capsule Take 100 mg by mouth 2 (two) times daily.  Marland Kitchen lisinopril (ZESTRIL) 5 MG tablet Take 5 mg by mouth daily.  . meloxicam (MOBIC) 7.5 MG tablet TAKE 1 TABLET BY MOUTH EVERY DAY  . methocarbamol (ROBAXIN) 500 MG tablet Take 500 mg by mouth 3 (three) times daily.  . Multiple Vitamin  (MULTIVITAMIN) capsule Take 1 capsule by mouth daily.  . simvastatin (ZOCOR) 40 MG tablet Take 40 mg by mouth every evening.     Allergies  Allergen Reactions  . Gabapentin Rash  . Oxycodone Itching and Rash    Social History   Socioeconomic History  . Marital status: Married    Spouse name: Not on file  . Number of children: Not on file  . Years of education: Not on file  . Highest education level: Not on file  Occupational History  . Not on file  Tobacco Use  . Smoking status: Former Smoker    Years: 30.00    Types: Cigarettes    Quit date: 11/26/2010    Years since quitting: 8.8  . Smokeless tobacco: Never Used  Substance and Sexual Activity  . Alcohol use: Yes    Alcohol/week: 2.0 standard drinks    Types: 2 Glasses of wine per week    Comment: occasional wine  . Drug use: No  . Sexual activity: Not Currently    Birth control/protection: Post-menopausal  Other Topics Concern  . Not on file  Social History Narrative   RAISED KID. THEN WORKED FOR 12 YRS. TROUBLE WITH HIPS AND KNEES TOOK HER OUT OF WORK DAUGHTER WORKS FOR [employer]. ANOTHER DAUGHTER STUDYING TO BE A NURSE.   Social Determinants of Health   Financial  Resource Strain:   . Difficulty of Paying Living Expenses: Not on file  Food Insecurity:   . Worried About Charity fundraiser in the Last Year: Not on file  . Ran Out of Food in the Last Year: Not on file  Transportation Needs:   . Lack of Transportation (Medical): Not on file  . Lack of Transportation (Non-Medical): Not on file  Physical Activity:   . Days of Exercise per Week: Not on file  . Minutes of Exercise per Session: Not on file  Stress:   . Feeling of Stress : Not on file  Social Connections:   . Frequency of Communication with Friends and Family: Not on file  . Frequency of Social Gatherings with Friends and Family: Not on file  . Attends Religious Services: Not on file  . Active Member of Clubs or Organizations: Not on file  .  Attends Archivist Meetings: Not on file  . Marital Status: Not on file  Intimate Partner Violence:   . Fear of Current or Ex-Partner: Not on file  . Emotionally Abused: Not on file  . Physically Abused: Not on file  . Sexually Abused: Not on file     Review of Systems: General: negative for chills, fever, night sweats or weight changes.  Cardiovascular: negative for chest pain, dyspnea on exertion, edema, orthopnea, palpitations, paroxysmal nocturnal dyspnea or shortness of breath Dermatological: negative for rash Respiratory: negative for cough or wheezing Urologic: negative for hematuria Abdominal: negative for nausea, vomiting, diarrhea, bright red blood per rectum, melena, or hematemesis Neurologic: negative for visual changes, syncope, or dizziness All other systems reviewed and are otherwise negative except as noted above.    Blood pressure (!) 146/82, pulse 88, height 5\' 5"  (1.651 m), weight 236 lb 3.2 oz (107.1 kg), SpO2 96 %.  General appearance: alert and no distress Neck: no adenopathy, no carotid bruit, no JVD, supple, symmetrical, trachea midline and thyroid not enlarged, symmetric, no tenderness/mass/nodules Lungs: clear to auscultation bilaterally Heart: regular rate and rhythm, S1, S2 normal, no murmur, click, rub or gallop Extremities: extremities normal, atraumatic, no cyanosis or edema Pulses: 2+ and symmetric Skin: Skin color, texture, turgor normal. No rashes or lesions Neurologic: Alert and oriented X 3, normal strength and tone. Normal symmetric reflexes. Normal coordination and gait  EKG sinus rhythm at 88 without ST or T wave changes.  I personally reviewed this EKG.  ASSESSMENT AND PLAN:   Hyperlipidemia History of hyperlipidemia on statin therapy followed by her PCP  Chest pain History of atypical chest pain in the past with a remote normal cath and stress test.  She currently is not complaining of chest pain  Shortness of breath Ms.  Dantes developed fatigue and shortness of breath on February 6.  The fatigue is resolved but she still has dyspnea on exertion.  I am going to get a 2D echo and a Lexiscan Myoview to further evaluate.      Lorretta Harp MD FACP,FACC,FAHA, [facility] 09/27/2019 10:26 AM

## 2019-10-04 ENCOUNTER — Ambulatory Visit (HOSPITAL_COMMUNITY)
Admission: RE | Admit: 2019-10-04 | Discharge: 2019-10-04 | Disposition: A | Discharge status: Home / Self Care | Payer: Medicare Other | Source: Ambulatory Visit | Attending: Cardiovascular Disease | Admitting: Cardiovascular Disease

## 2019-10-04 ENCOUNTER — Other Ambulatory Visit: Payer: Self-pay

## 2019-10-04 DIAGNOSIS — R0602 Shortness of breath: Secondary | ICD-10-CM | POA: Insufficient documentation

## 2019-10-04 NOTE — Progress Notes (Signed)
*  PRELIMINARY RESULTS* Echocardiogram 2D Echocardiogram has been performed.  Stacey Drain 10/04/2019, 10:17 AM

## 2019-10-08 ENCOUNTER — Telehealth (HOSPITAL_COMMUNITY): Payer: Self-pay

## 2019-10-08 NOTE — Telephone Encounter (Signed)
Encounter complete. 

## 2019-10-10 ENCOUNTER — Other Ambulatory Visit: Payer: Self-pay

## 2019-10-10 ENCOUNTER — Ambulatory Visit (HOSPITAL_COMMUNITY)
Admission: RE | Admit: 2019-10-10 | Discharge: 2019-10-10 | Disposition: A | Discharge status: Home / Self Care | Payer: Medicare Other | Source: Ambulatory Visit | Attending: Internal Medicine | Admitting: Internal Medicine

## 2019-10-10 DIAGNOSIS — R0602 Shortness of breath: Secondary | ICD-10-CM | POA: Diagnosis not present

## 2019-10-10 MED ORDER — TECHNETIUM TC 99M TETROFOSMIN IV KIT
31.2000 | PACK | Freq: Once | INTRAVENOUS | Status: AC | PRN
  Administered 2019-10-10: 31.2 via INTRAVENOUS
  Filled 2019-10-10: qty 32

## 2019-10-10 MED ORDER — REGADENOSON 0.4 MG/5ML IV SOLN
0.4000 mg | Freq: Once | INTRAVENOUS | Status: AC
  Administered 2019-10-10: 0.4 mg via INTRAVENOUS

## 2019-10-11 ENCOUNTER — Ambulatory Visit (HOSPITAL_COMMUNITY)
Admission: RE | Admit: 2019-10-11 | Discharge: 2019-10-11 | Disposition: A | Discharge status: Home / Self Care | Payer: Medicare Other | Source: Ambulatory Visit | Attending: Cardiology | Admitting: Cardiology

## 2019-10-11 LAB — MYOCARDIAL PERFUSION IMAGING
LV dias vol: 102 mL (ref 46–106)
LV sys vol: 44 mL
Peak HR: 106 {beats}/min
Rest HR: 71 {beats}/min
SDS: 11
SRS: 2
SSS: 13
TID: 1.21

## 2019-10-11 MED ORDER — TECHNETIUM TC 99M TETROFOSMIN IV KIT
32.1000 | PACK | Freq: Once | INTRAVENOUS | Status: AC | PRN
  Administered 2019-10-11: 32.1 via INTRAVENOUS

## 2019-10-13 ENCOUNTER — Other Ambulatory Visit: Payer: Self-pay | Admitting: Orthopedic Surgery

## 2019-10-13 DIAGNOSIS — M25552 Pain in left hip: Secondary | ICD-10-CM

## 2019-10-13 DIAGNOSIS — M25551 Pain in right hip: Secondary | ICD-10-CM

## 2019-12-10 ENCOUNTER — Telehealth: Payer: Self-pay | Admitting: Orthopedic Surgery

## 2019-12-10 DIAGNOSIS — M25552 Pain in left hip: Secondary | ICD-10-CM

## 2019-12-10 DIAGNOSIS — M25551 Pain in right hip: Secondary | ICD-10-CM

## 2019-12-10 MED ORDER — MELOXICAM 7.5 MG PO TABS: 7.5000 mg | ORAL_TABLET | Freq: Two times a day (BID) | ORAL | 1 refills | Status: DC

## 2019-12-10 NOTE — Telephone Encounter (Signed)
Increase to 1 tablet twice a day if she is already on that we can start some tramadol

## 2019-12-10 NOTE — Telephone Encounter (Signed)
Patient returned call (voice message left at 1:41pm)

## 2019-12-10 NOTE — Telephone Encounter (Signed)
Called patient to advise take bid. Left message for her to call me back

## 2019-12-10 NOTE — Telephone Encounter (Signed)
[name] called and said she has been taking the Meloxicam 7.5 mgs. But this doesn't seem to be helping as much as it used to.  She wants to know if you can increase the mgs. Or possibly give her something else.  She uses [facility] in [place]

## 2019-12-11 NOTE — Telephone Encounter (Signed)
I called her to advise, she has voiced understanding.  

## 2020-02-03 ENCOUNTER — Other Ambulatory Visit: Payer: Self-pay

## 2020-02-03 ENCOUNTER — Ambulatory Visit: Payer: Medicare Other

## 2020-02-03 ENCOUNTER — Ambulatory Visit: Payer: Medicare Other | Admitting: Orthopedic Surgery

## 2020-02-03 ENCOUNTER — Encounter: Payer: Self-pay | Admitting: Orthopedic Surgery

## 2020-02-03 VITALS — BP 145/75 | HR 42 | Ht 65.0 in | Wt 233.5 lb

## 2020-02-03 DIAGNOSIS — M1610 Unilateral primary osteoarthritis, unspecified hip: Secondary | ICD-10-CM

## 2020-02-03 DIAGNOSIS — M160 Bilateral primary osteoarthritis of hip: Secondary | ICD-10-CM | POA: Diagnosis not present

## 2020-02-03 NOTE — Progress Notes (Signed)
145/75 °

## 2020-02-03 NOTE — Progress Notes (Signed)
Chief Complaint  Patient presents with  . Hip Pain    Bilateral/L is the worst/    Encounter Diagnosis  Name Primary?  . Hip arthritis Yes    BP (!) 145/75   Pulse (!) 42   Ht 5\' 5"  (1.651 m)   Wt 233 lb 8 oz (105.9 kg)   BMI 38.63 kg/m   69 year old female with left hip arthritis right hip arthritis spinal stenosis status post L5-S1 disc surgery MRI in 2019 showed spinal stenosis however consultation with neurosurgery indicated patient did not need any surgery  Patient recently had increased pain in her left hip and her meloxicam was increased from 7.5 to 15 mg but it did not improve her symptoms  She complains of difficulty walking sitting dressing with groin pain and bilateral pain in the greater trochanteric region mild back discomfort does not seem to be her primary source of discomfort  Past Medical History:  Diagnosis Date  . Anxiety    panic attack  . Arthritis   . Carpal tunnel syndrome on both sides   . Complication of anesthesia    1st knee replacement slow to awaken, 2nd knee replacement - woke up had to have more anesthesia  . High cholesterol   . History of kidney stones   . Knee joint replacement status   . Shortness of breath dyspnea    with exertion   Past Surgical History:  Procedure Laterality Date  . APPENDECTOMY    . BREAST CYST EXCISION    . BUNIONECTOMY  09/29/2011   RIGHT  . CARPAL TUNNEL RELEASE  04/13/2012   rocedure: CARPAL TUNNEL RELEASE;  Surgeon: 04/15/2012, MD;  Location: [facility];  Service: Orthopedics;  Laterality: Right;  . COLONOSCOPY N/A 09/11/2015   Procedure: COLONOSCOPY;  Surgeon: 09/13/2015, MD;  [facility]: [facility];  Service: Endoscopy;  Laterality: N/A;  1000  . JOINT REPLACEMENT Bilateral 2008   bilateral knees, [facility]; Rebbie Lauricella  . KNEE SURGERY    . LAMINECTOMY    . ORIF HUMERUS FRACTURE Right 01/11/2016   Procedure: OPEN REDUCTION INTERNAL FIXATION (ORIF) RIGHT PROXIMAL HUMERUS FRACTURE;  Surgeon: 01/13/2016, MD;  Location: [facility];  Service: Orthopedics;  Laterality: Right;    Examination shows that the leg lengths are equal.  Her right hip range of motion is much better than the left including the flexion abduction and internal rotation  On the left side her flexion is limited to 110 degrees she has 5 to 10 degrees of internal rotation and extension shoulder abduction is limited to 20 degrees and range of motion reproduces groin pain as well as there is some tenderness over both greater trochanters  Her lumbar spine shows mild tenderness  Repeat imaging today shows osteoarthritis of both hips left greater than right  After discussion patient's sister had surgery with Dr. [name] at [facility]  Were going to make a referral to him for possible surgical intervention.

## 2020-02-03 NOTE — Addendum Note (Signed)
Addended by: Baird Kay on: 02/03/2020 09:35 AM   Modules accepted: Orders

## 2020-02-03 NOTE — Patient Instructions (Signed)
DR Veda Canning of [facility]

## 2020-02-10 ENCOUNTER — Other Ambulatory Visit: Payer: Self-pay | Admitting: Orthopedic Surgery

## 2020-02-10 DIAGNOSIS — M25551 Pain in right hip: Secondary | ICD-10-CM

## 2020-02-10 DIAGNOSIS — M25552 Pain in left hip: Secondary | ICD-10-CM

## 2020-02-25 ENCOUNTER — Telehealth: Payer: Self-pay

## 2020-02-25 NOTE — Telephone Encounter (Signed)
   [facility] Pre-operative Risk Assessment    HEARTCARE STAFF: - Please ensure there is not already an duplicate clearance open for this procedure. - Under Visit Info/Reason for Call, type in Other and utilize the format Clearance MM/DD/YY or Clearance TBD. Do not use dashes or single digits. - If request is for dental extraction, please clarify the # of teeth to be extracted.  Request for surgical clearance:  1. What type of surgery is being performed? Left Total Hip Arthroplasty   2. When is this surgery scheduled? TBD   3. What type of clearance is required (medical clearance vs. Pharmacy clearance to hold med vs. Both)?  Both  4. Are there any medications that need to be held prior to surgery and how long? ASA 81 mg  5. Practice name and name of physician performing surgery? [facility], Dr. Bess Harvest   6. What is the office phone number? [phone]   7.   What is the office fax number? [phone] Attn: [name]  8.   Anesthesia type (None, local, MAC, general) ? Spinal   [name] 02/25/2020, 9:42 AM  _________________________________________________________________   (provider comments below)

## 2020-02-26 NOTE — Telephone Encounter (Signed)
   Primary Cardiologist: Nanetta Batty, MD  Chart reviewed as part of pre-operative protocol coverage. Patient was contacted 02/26/2020 in reference to pre-operative risk assessment for pending surgery as outlined below.  [name] was last seen on 09/27/19 by Dr. Allyson Sabal with hx or normal cardiac cath in past and recent normal stress test 10/11/19 and stable Echo with normal EF 10/04/19.  Since that day, [name] has done well without chest pain.  It would be acceptable from cardiac perspective to hold ASA for up to 7 days   Therefore, based on ACC/AHA guidelines, the patient would be at acceptable risk for the planned procedure without further cardiovascular testing.   I will route this recommendation to the requesting party via Epic fax function and remove from pre-op pool. Please call with questions.  Nada Boozer, NP 02/26/2020, 3:48 PM

## 2020-03-05 ENCOUNTER — Telehealth: Payer: Self-pay | Admitting: Orthopedic Surgery

## 2020-03-05 NOTE — Telephone Encounter (Signed)
Patient called to inquire about form for temporary handicapped sticker, said for now until letter/form is received in mail. States was told when she inquired at [facility] with her new vehicle that she would need another form.

## 2020-03-05 NOTE — Telephone Encounter (Signed)
Form in Dr's box. Patient aware it will be reviewed by Dr Romeo Apple, and we will call her when done.

## 2020-03-09 NOTE — Telephone Encounter (Signed)
Notified patient form is ready for pick up . °

## 2020-04-03 DIAGNOSIS — Z96649 Presence of unspecified artificial hip joint: Secondary | ICD-10-CM | POA: Insufficient documentation

## 2020-04-09 ENCOUNTER — Other Ambulatory Visit: Payer: Self-pay | Admitting: Orthopedic Surgery

## 2020-04-09 DIAGNOSIS — M25552 Pain in left hip: Secondary | ICD-10-CM

## 2020-04-09 DIAGNOSIS — M25551 Pain in right hip: Secondary | ICD-10-CM

## 2020-05-14 ENCOUNTER — Encounter: Payer: Self-pay | Admitting: Cardiology

## 2020-05-14 ENCOUNTER — Other Ambulatory Visit: Payer: Self-pay

## 2020-05-14 ENCOUNTER — Ambulatory Visit: Payer: Medicare Other | Admitting: Cardiology

## 2020-05-14 VITALS — BP 118/72 | HR 68 | Ht 65.0 in | Wt 215.4 lb

## 2020-05-14 DIAGNOSIS — Z8249 Family history of ischemic heart disease and other diseases of the circulatory system: Secondary | ICD-10-CM | POA: Insufficient documentation

## 2020-05-14 DIAGNOSIS — R079 Chest pain, unspecified: Secondary | ICD-10-CM

## 2020-05-14 DIAGNOSIS — E782 Mixed hyperlipidemia: Secondary | ICD-10-CM | POA: Diagnosis not present

## 2020-05-14 NOTE — Assessment & Plan Note (Signed)
Followed by PCP

## 2020-05-14 NOTE — Assessment & Plan Note (Signed)
Remote cath showed normal coronaries, Myoview low risk March 2021, echo showed normal LVF

## 2020-05-14 NOTE — Progress Notes (Signed)
Cardiology Office Note:    Date:  05/14/2020   ID:  Audrey, Marquez 07-20-1951, MRN [id number]  PCP:  Audrey Morgan, MD  Cardiologist:  Audrey Batty, MD  Electrophysiologist:  None   Referring MD: Audrey Morgan, MD   No chief complaint on file.   History of Present Illness:    [name] is a pleasant 69 y.o. female with a hx of past chest pain and DOE.  She had a remote cath that was normal.  She saw Dr [name] in March 2021 with complaints of DOE.  Echo and Myoview were unremarkable.  She presents today for routine follow up.  Since we saw her last she has done well, "not as SOB".  She had Lt hip replace as an OP in August.  She is recovering well from this.   Past Medical History:  Diagnosis Date  . Anxiety    panic attack  . Arthritis   . Carpal tunnel syndrome on both sides   . Complication of anesthesia    1st knee replacement slow to awaken, 2nd knee replacement - woke up had to have more anesthesia  . High cholesterol   . History of kidney stones   . Knee joint replacement status   . Shortness of breath dyspnea    with exertion    Past Surgical History:  Procedure Laterality Date  . APPENDECTOMY    . BREAST CYST EXCISION    . BUNIONECTOMY  09/29/2011   RIGHT  . CARPAL TUNNEL RELEASE  04/13/2012   rocedure: CARPAL TUNNEL RELEASE;  Surgeon: Audrey Hearing, MD;  Location: [facility];  Service: Orthopedics;  Laterality: Right;  . COLONOSCOPY N/A 09/11/2015   Procedure: COLONOSCOPY;  Surgeon: Audrey Bali, MD;  [facility];  Service: Endoscopy;  Laterality: N/A;  1000  . JOINT REPLACEMENT Bilateral 2008   bilateral knees, [facility]; [place]  . KNEE SURGERY    . LAMINECTOMY    . ORIF HUMERUS FRACTURE Right 01/11/2016   Procedure: OPEN REDUCTION INTERNAL FIXATION (ORIF) RIGHT PROXIMAL HUMERUS FRACTURE;  Surgeon: Audrey Copa, MD;  Location: [facility];  Service: Orthopedics;  Laterality: Right;    Current Medications: Current Meds  Medication  Sig  . ALPRAZolam (XANAX) 1 MG tablet Take 1 mg by mouth at bedtime as needed for sleep.   Marland Kitchen aspirin EC 81 MG tablet Take 81 mg by mouth daily.  Marland Kitchen atorvastatin (LIPITOR) 40 MG tablet Take 40 mg by mouth daily.  Marland Kitchen docusate sodium (COLACE) 100 MG capsule Take 100 mg by mouth 2 (two) times daily.  Marland Kitchen lisinopril (ZESTRIL) 5 MG tablet Take 5 mg by mouth daily.  . methocarbamol (ROBAXIN) 500 MG tablet Take 500 mg by mouth as needed for muscle spasms.  . Multiple Vitamin (MULTIVITAMIN) capsule Take 1 capsule by mouth daily.     Allergies:   Gabapentin and Oxycodone   Social History   Socioeconomic History  . Marital status: Married    Spouse name: Not on file  . Number of children: Not on file  . Years of education: Not on file  . Highest education level: Not on file  Occupational History  . Not on file  Tobacco Use  . Smoking status: Former Smoker    Years: 30.00    Types: Cigarettes    Quit date: 11/26/2010    Years since quitting: 9.4  . Smokeless tobacco: Never Used  Substance and Sexual Activity  . Alcohol use: Yes  Alcohol/week: 2.0 standard drinks    Types: 2 Glasses of wine per week    Comment: occasional wine  . Drug use: No  . Sexual activity: Not Currently    Birth control/protection: Post-menopausal  Other Topics Concern  . Not on file  Social History Narrative   RAISED KID. THEN WORKED FOR 12 YRS. TROUBLE WITH HIPS AND KNEES TOOK HER OUT OF WORK DAUGHTER WORKS FOR [employer]. ANOTHER DAUGHTER STUDYING TO BE A NURSE.   Social Determinants of Health   Financial Resource Strain:   . Difficulty of Paying Living Expenses: Not on file  Food Insecurity:   . Worried About Programme researcher, broadcasting/film/video in the Last Year: Not on file  . Ran Out of Food in the Last Year: Not on file  Transportation Needs:   . Lack of Transportation (Medical): Not on file  . Lack of Transportation (Non-Medical): Not on file  Physical Activity:   . Days of Exercise per Week: Not on file  .  Minutes of Exercise per Session: Not on file  Stress:   . Feeling of Stress : Not on file  Social Connections:   . Frequency of Communication with Friends and Family: Not on file  . Frequency of Social Gatherings with Friends and Family: Not on file  . Attends Religious Services: Not on file  . Active Member of Clubs or Organizations: Not on file  . Attends Banker Meetings: Not on file  . Marital Status: Not on file     Family History: The patient's family history includes Arthritis in her mother; Birth defects in her son; COPD in her father; Cancer in her brother, father, maternal grandmother, and paternal grandfather; Colon polyps in her brother, father, mother, and sister; Congestive Heart Failure in her father, paternal aunt, paternal aunt, and paternal grandmother; Dementia in her mother; Diabetes in her brother and maternal grandfather; Early death in her son; Heart attack in her maternal grandfather; Heart disease in her father and paternal grandmother; Hyperlipidemia in her brother and sister; Hypertension in her mother; Irritable bowel syndrome in her daughter; Stroke in her paternal grandmother. There is no history of Anesthesia problems, Hypotension, Malignant hyperthermia, or Pseudochol deficiency.  ROS:   Please see the history of present illness.     All other systems reviewed and are negative.  EKGs/Labs/Other Studies Reviewed:    The following studies were reviewed today: Myoview 10/11/2019-  The left ventricular ejection fraction is normal (55-65%).  Nuclear stress EF: 57%.  There was no ST segment deviation noted during stress.  The study is normal.  This is a low risk study.  Compared to report from prior study, there was previously a fixed defect along the inferior wall. This is not seen on the current study. No images available for direct comparison.    Echo 10/04/2019- IMPRESSIONS    1. Left ventricular ejection fraction, by estimation, is  60 to 65%. The  left ventricle has normal function. The left ventricle has no regional  wall motion abnormalities. Left ventricular diastolic parameters are  consistent with Grade I diastolic  dysfunction (impaired relaxation).  2. Right ventricular systolic function is normal. The right ventricular  size is normal. There is normal pulmonary artery systolic pressure. The  estimated right ventricular systolic pressure is 26.6 mmHg.  3. The mitral valve is grossly normal. Trivial mitral valve  regurgitation.  4. The aortic valve is tricuspid. Aortic valve regurgitation is not  visualized.  5. The inferior vena  cava is normal in size with greater than 50%  respiratory variability, suggesting right atrial pressure of 3 mmHg.   EKG:  EKG is ordered today.  The ekg ordered today demonstrates NSR, 68  Recent Labs: No results found for requested labs within last 8760 hours.  Recent Lipid Panel No results found for: CHOL, TRIG, HDL, CHOLHDL, VLDL, LDLCALC, LDLDIRECT  Physical Exam:    VS:  BP 118/72   Pulse 68   Ht 5\' 5"  (1.651 m)   Wt 215 lb 6.4 oz (97.7 kg)   BMI 35.84 kg/m     Wt Readings from Last 3 Encounters:  05/14/20 215 lb 6.4 oz (97.7 kg)  02/03/20 233 lb 8 oz (105.9 kg)  10/10/19 236 lb (107 kg)     GEN: Overweight Caucasian female well developed in no acute distress HEENT: Normal NECK: No JVD; No carotid bruits CARDIAC: RRR, no murmurs, rubs, gallops RESPIRATORY:  Clear to auscultation without rales, wheezing or rhonchi  ABDOMEN: Soft, non-tender, non-distended MUSCULOSKELETAL:  No edema; No deformity  SKIN: Warm and dry NEUROLOGIC:  Alert and oriented x 3 PSYCHIATRIC:  Normal affect   ASSESSMENT:    Chest pain of uncertain etiology Remote cath showed normal coronaries, Myoview low risk March 2021, echo showed normal LVF  Family history of coronary artery disease .  Hyperlipidemia Followed by PCP  PLAN:    Same Rx- Dr 09-22-1980 in 6  months   Medication Adjustments/Labs and Tests Ordered: Current medicines are reviewed at length with the patient today.  Concerns regarding medicines are outlined above.  No orders of the defined types were placed in this encounter.  No orders of the defined types were placed in this encounter.   There are no Patient Instructions on file for this visit.   Audrey Sabal, PA-C  05/14/2020 12:04 PM    [facility]

## 2020-05-14 NOTE — Patient Instructions (Signed)

## 2020-05-19 NOTE — Addendum Note (Signed)
Addended by: Dorris Fetch on: 05/19/2020 04:34 PM   Modules accepted: Orders

## 2020-06-06 ENCOUNTER — Other Ambulatory Visit: Payer: Self-pay | Admitting: Orthopedic Surgery

## 2020-06-06 DIAGNOSIS — M25552 Pain in left hip: Secondary | ICD-10-CM

## 2020-06-06 DIAGNOSIS — M25551 Pain in right hip: Secondary | ICD-10-CM

## 2020-06-08 ENCOUNTER — Other Ambulatory Visit (HOSPITAL_COMMUNITY): Payer: Self-pay | Admitting: Adult Health

## 2020-06-08 DIAGNOSIS — Z1231 Encounter for screening mammogram for malignant neoplasm of breast: Secondary | ICD-10-CM

## 2020-07-13 ENCOUNTER — Ambulatory Visit (HOSPITAL_COMMUNITY): Payer: Medicare Other

## 2020-07-22 ENCOUNTER — Ambulatory Visit (HOSPITAL_COMMUNITY)
Admission: RE | Admit: 2020-07-22 | Discharge: 2020-07-22 | Disposition: A | Discharge status: Home / Self Care | Payer: Medicare Other | Source: Ambulatory Visit | Attending: Adult Health | Admitting: Adult Health

## 2020-07-22 ENCOUNTER — Other Ambulatory Visit: Payer: Self-pay

## 2020-07-22 DIAGNOSIS — Z1231 Encounter for screening mammogram for malignant neoplasm of breast: Secondary | ICD-10-CM | POA: Diagnosis present

## 2020-09-15 ENCOUNTER — Encounter: Payer: Self-pay | Admitting: Internal Medicine

## 2020-10-20 ENCOUNTER — Other Ambulatory Visit: Payer: Self-pay | Admitting: Orthopedic Surgery

## 2020-10-20 DIAGNOSIS — M25552 Pain in left hip: Secondary | ICD-10-CM

## 2020-10-20 DIAGNOSIS — M25551 Pain in right hip: Secondary | ICD-10-CM

## 2020-11-20 ENCOUNTER — Encounter: Payer: Self-pay | Admitting: Cardiovascular Disease

## 2020-11-20 ENCOUNTER — Other Ambulatory Visit: Payer: Self-pay

## 2020-11-20 ENCOUNTER — Ambulatory Visit: Payer: Medicare Other | Admitting: Cardiovascular Disease

## 2020-11-20 VITALS — BP 130/76 | HR 66 | Ht 65.0 in | Wt 223.0 lb

## 2020-11-20 DIAGNOSIS — E782 Mixed hyperlipidemia: Secondary | ICD-10-CM | POA: Diagnosis not present

## 2020-11-20 DIAGNOSIS — R079 Chest pain, unspecified: Secondary | ICD-10-CM | POA: Diagnosis not present

## 2020-11-20 DIAGNOSIS — R0602 Shortness of breath: Secondary | ICD-10-CM

## 2020-11-20 NOTE — Addendum Note (Signed)
Addended by: Bernita Buffy on: 11/20/2020 04:22 PM   Modules accepted: Orders

## 2020-11-20 NOTE — Assessment & Plan Note (Signed)
History of atypical chest pain in the past with a Myoview performed 10/16/2019 which was low risk.  She no longer has chest pain.

## 2020-11-20 NOTE — Progress Notes (Addendum)
11/20/2020 [name]   Jul 15, 1951  [id number]  Primary Physician Gareth Morgan, MD Primary Cardiologist: Runell Gess MD Nicholes Calamity, [place]  HPI:  [name] is a 71 y.o.  moderately overweight married Caucasian female mother of 3 daughters, grandmother of 5 grandchildren who was referred by Dr. Sudie Bailey in [place] for cardiovascular evaluation because of chest pain.  I last saw her in the office 09/27/2019.Currently on disability for various orthopedic issues including knee and back issues. She previously was taking care of by Dr. Alanda Amass. Risk factors include 80 pack years of tobacco abuse having quit 10 years ago, treated hyperlipidemia and family history. Father did have CAD. Both her father and mother, [name] and [name], were patients of mine as well. She has had a normal cath remotely and stress test as well. She had an episode of atypical chest pain a month ago the last for several minutes associated with shortness of breath but none since.  She had done well until August 31, 2019 when she developed new onset dyspnea on exertion/shortness of breath and fatigue.  The fatigue is since resolved but she continues to be dyspneic.  She denies chest pain.  She has 2 daughters that are nurses who have measured her oxygen saturation in the mid to low 80s.  She does have a long history tobacco abuse having quit 10 to 12 years ago.  I today Myoview stress test 10/16/2019 which was entirely normal as was a 2D echo performed 10/04/2019 with grade 1 diastolic dysfunction.  Since I saw her a year ago she has had no recurrent chest pain and her dyspnea has significantly improved.   Current Meds  Medication Sig  . ALPRAZolam (XANAX) 1 MG tablet Take 1 mg by mouth at bedtime as needed for sleep.   Marland Kitchen aspirin EC 81 MG tablet Take 81 mg by mouth daily.  Marland Kitchen atorvastatin (LIPITOR) 40 MG tablet Take 40 mg by mouth daily.  Marland Kitchen lisinopril (ZESTRIL) 5 MG tablet Take 5 mg by  mouth daily.  . meloxicam (MOBIC) 7.5 MG tablet TAKE 1 TABLET BY MOUTH 2 TIMES DAILY.  . Multiple Vitamin (MULTIVITAMIN) capsule Take 1 capsule by mouth daily.  . [DISCONTINUED] docusate sodium (COLACE) 100 MG capsule Take 100 mg by mouth 2 (two) times daily.  . [DISCONTINUED] methocarbamol (ROBAXIN) 500 MG tablet Take 500 mg by mouth as needed for muscle spasms.     Allergies  Allergen Reactions  . Gabapentin Rash  . Oxycodone Itching and Rash    Social History   Socioeconomic History  . Marital status: Married    Spouse name: Not on file  . Number of children: Not on file  . Years of education: Not on file  . Highest education level: Not on file  Occupational History  . Not on file  Tobacco Use  . Smoking status: Former Smoker    Years: 30.00    Types: Cigarettes    Quit date: 11/26/2010    Years since quitting: 9.9  . Smokeless tobacco: Never Used  Substance and Sexual Activity  . Alcohol use: Yes    Alcohol/week: 2.0 standard drinks    Types: 2 Glasses of wine per week    Comment: occasional wine  . Drug use: No  . Sexual activity: Not Currently    Birth control/protection: Post-menopausal  Other Topics Concern  . Not on file  Social History Narrative   RAISED KID. THEN WORKED FOR 12 YRS. TROUBLE WITH  HIPS AND KNEES TOOK HER OUT OF WORK DAUGHTER WORKS FOR [employer]. ANOTHER DAUGHTER STUDYING TO BE A NURSE.   Social Determinants of Health   Financial Resource Strain: Not on file  Food Insecurity: Not on file  Transportation Needs: Not on file  Physical Activity: Not on file  Stress: Not on file  Social Connections: Not on file  Intimate Partner Violence: Not on file     Review of Systems: General: negative for chills, fever, night sweats or weight changes.  Cardiovascular: negative for chest pain, dyspnea on exertion, edema, orthopnea, palpitations, paroxysmal nocturnal dyspnea or shortness of breath Dermatological: negative for rash Respiratory: negative  for cough or wheezing Urologic: negative for hematuria Abdominal: negative for nausea, vomiting, diarrhea, bright red blood per rectum, melena, or hematemesis Neurologic: negative for visual changes, syncope, or dizziness All other systems reviewed and are otherwise negative except as noted above.    Blood pressure 130/76, pulse 66, height 5\' 5"  (1.651 m), weight 223 lb (101.2 kg), SpO2 97 %.  General appearance: alert and no distress Neck: no adenopathy, no carotid bruit, no JVD, supple, symmetrical, trachea midline and thyroid not enlarged, symmetric, no tenderness/mass/nodules Lungs: clear to auscultation bilaterally Heart: regular rate and rhythm, S1, S2 normal, no murmur, click, rub or gallop Extremities: extremities normal, atraumatic, no cyanosis or edema Pulses: 2+ and symmetric Skin: Skin color, texture, turgor normal. No rashes or lesions Neurologic: Alert and oriented X 3, normal strength and tone. Normal symmetric reflexes. Normal coordination and gait  EKG sinus rhythm at 66 without ST or T wave changes.  I personally reviewed this EKG.  ASSESSMENT AND PLAN:   Hyperlipidemia History of hyperlipidemia on statin therapy followed by her PCP recent lipid profile followed performed by her PCP 10/04/2019 revealed total cholesterol 207, LDL 122 and HDL of 52.  Chest pain of uncertain etiology History of atypical chest pain in the past with a Myoview performed 10/16/2019 which was low risk.  She no longer has chest pain.  Shortness of breath History of dyspnea on exertion which has significantly improved since I saw her a year ago.  I suspect this was related to primary pulmonary issue.  She does have greater than 80 pack years of tobacco abuse having quit over 10 years ago.  2D echo performed 10/04/2019 revealed normal LV systolic function, grade 1 diastolic dysfunction without evidence of valvular abnormalities.      12/04/2019 MD FACP,FACC,FAHA, [facility] 11/20/2020 4:10  PM

## 2020-11-20 NOTE — Assessment & Plan Note (Addendum)
History of hyperlipidemia on statin therapy followed by her PCP recent lipid profile followed performed by her PCP 10/04/2019 revealed total cholesterol 207, LDL 122 and HDL of 52.

## 2020-11-20 NOTE — Patient Instructions (Signed)

## 2020-11-20 NOTE — Assessment & Plan Note (Signed)
History of dyspnea on exertion which has significantly improved since I saw her a year ago.  I suspect this was related to primary pulmonary issue.  She does have greater than 80 pack years of tobacco abuse having quit over 10 years ago.  2D echo performed 10/04/2019 revealed normal LV systolic function, grade 1 diastolic dysfunction without evidence of valvular abnormalities.

## 2020-12-15 ENCOUNTER — Other Ambulatory Visit: Payer: Self-pay | Admitting: Orthopedic Surgery

## 2020-12-15 DIAGNOSIS — M25552 Pain in left hip: Secondary | ICD-10-CM

## 2020-12-15 DIAGNOSIS — M25551 Pain in right hip: Secondary | ICD-10-CM

## 2021-01-04 ENCOUNTER — Encounter: Payer: Self-pay | Admitting: Orthopedic Surgery

## 2021-01-04 ENCOUNTER — Ambulatory Visit: Payer: Medicare Other

## 2021-01-04 ENCOUNTER — Other Ambulatory Visit: Payer: Self-pay

## 2021-01-04 ENCOUNTER — Ambulatory Visit: Payer: Medicare Other | Admitting: Orthopedic Surgery

## 2021-01-04 VITALS — BP 157/73 | HR 73 | Ht 65.0 in | Wt 213.0 lb

## 2021-01-04 DIAGNOSIS — M25561 Pain in right knee: Secondary | ICD-10-CM

## 2021-01-04 DIAGNOSIS — Z96652 Presence of left artificial knee joint: Secondary | ICD-10-CM

## 2021-01-04 DIAGNOSIS — Z96642 Presence of left artificial hip joint: Secondary | ICD-10-CM | POA: Diagnosis not present

## 2021-01-04 DIAGNOSIS — G8929 Other chronic pain: Secondary | ICD-10-CM

## 2021-01-04 DIAGNOSIS — Z96651 Presence of right artificial knee joint: Secondary | ICD-10-CM

## 2021-01-04 DIAGNOSIS — M4726 Other spondylosis with radiculopathy, lumbar region: Secondary | ICD-10-CM

## 2021-01-04 DIAGNOSIS — M25562 Pain in left knee: Secondary | ICD-10-CM

## 2021-01-04 DIAGNOSIS — M25551 Pain in right hip: Secondary | ICD-10-CM

## 2021-01-04 DIAGNOSIS — M5410 Radiculopathy, site unspecified: Secondary | ICD-10-CM

## 2021-01-04 NOTE — Progress Notes (Signed)
Follow-up   Chief Complaint  Patient presents with   Knee Pain    Bilateral knee pain    Hip Pain    Right groin pain     [name] is 70 years old she is status post bilateral knee replacements about 14 years ago with DePuy fixed-bearing posterior stabilized total knees she is also had a lumbar discectomy over 14 or 15 years ago and is recently had a left total hip by Dr. Linna Caprice  She comes in today complaining of bilateral knee pain and right hip and groin pain x1 month.  She reports to minor falls but her knees got better after the fall so we do not attribute the current pain to that.  Her pain is in the front of her knee her back hurts when she is standing she cannot sweep or stand for long periods of time she is able to walk but says she has some difficulty walking  Her exam shows normal range of motion in both knees both knees are stable no signs of infection no effusion she does walk with a flexed lumbosacral junction  Her right hip shows decreased range of motion and pain with internal rotation  X-rays of the knees show 2 stable total knees without any evidence of significant wear or osteolysis or malalignment  Her right hip shows osteoarthritis  Her lumbar spine shows 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   Pain in right hip Yes   Chronic pain of left knee    Chronic pain of right knee    Chronic radicular pain of lower extremity    H/O total knee replacement, right    H/O total knee replacement, left    H/O total hip arthroplasty, left     I do not see any issues with her knees I think her pain is referred  Recommend MRI and follow-up with ESI  Virtual visit to give results and then order ESI

## 2021-01-04 NOTE — Patient Instructions (Addendum)
Schedule the MRI   Virtual visit to get results   Then we ll order the esi   While we are working on your approval for MRI please go ahead and call to schedule your appointment with [name] within at least one (1) week.   Central Scheduling [phone]

## 2021-01-14 ENCOUNTER — Ambulatory Visit (HOSPITAL_COMMUNITY)
Admission: RE | Admit: 2021-01-14 | Discharge: 2021-01-14 | Disposition: A | Discharge status: Home / Self Care | Payer: Medicare Other | Source: Ambulatory Visit | Attending: Orthopedic Surgery | Admitting: Orthopedic Surgery

## 2021-01-14 DIAGNOSIS — G8929 Other chronic pain: Secondary | ICD-10-CM | POA: Diagnosis present

## 2021-01-14 DIAGNOSIS — M5410 Radiculopathy, site unspecified: Secondary | ICD-10-CM | POA: Diagnosis not present

## 2021-01-20 ENCOUNTER — Telehealth: Payer: Self-pay | Admitting: Orthopedic Surgery

## 2021-01-20 NOTE — Telephone Encounter (Signed)
Patient called and wanted to know if Dr. Romeo Apple could give her something for pain until she speaks to Dr. Romeo Apple regarding her MRI results.  She uses [facility]    She also wanted [place] to know that on her allergies it states that she is allergic to Oxycodone but this is not true.  She asks that we remove that from her record

## 2021-01-21 ENCOUNTER — Other Ambulatory Visit: Payer: Self-pay | Admitting: Orthopedic Surgery

## 2021-01-21 MED ORDER — PREDNISONE 10 MG (48) PO TBPK: ORAL_TABLET | Freq: Every day | ORAL | 0 refills | Status: DC

## 2021-01-21 NOTE — Telephone Encounter (Signed)
I called her to advise / 

## 2021-01-21 NOTE — Progress Notes (Signed)
Meds ordered this encounter  Medications   predniSONE (STERAPRED UNI-PAK 48 TAB) 10 MG (48) TBPK tablet    Sig: Take by mouth daily. 12 DAYS DS AS DIRECTED    Dispense:  48 tablet    Refill:  0

## 2021-01-28 ENCOUNTER — Other Ambulatory Visit: Payer: Self-pay

## 2021-01-28 ENCOUNTER — Ambulatory Visit (INDEPENDENT_AMBULATORY_CARE_PROVIDER_SITE_OTHER): Payer: Medicare Other | Admitting: Orthopedic Surgery

## 2021-01-28 DIAGNOSIS — M5410 Radiculopathy, site unspecified: Secondary | ICD-10-CM

## 2021-01-28 DIAGNOSIS — M25551 Pain in right hip: Secondary | ICD-10-CM | POA: Diagnosis not present

## 2021-01-28 DIAGNOSIS — Z96651 Presence of right artificial knee joint: Secondary | ICD-10-CM

## 2021-01-28 DIAGNOSIS — G8929 Other chronic pain: Secondary | ICD-10-CM

## 2021-01-28 DIAGNOSIS — M25561 Pain in right knee: Secondary | ICD-10-CM | POA: Diagnosis not present

## 2021-01-28 DIAGNOSIS — M25562 Pain in left knee: Secondary | ICD-10-CM

## 2021-01-28 DIAGNOSIS — Z96642 Presence of left artificial hip joint: Secondary | ICD-10-CM

## 2021-01-28 DIAGNOSIS — Z96652 Presence of left artificial knee joint: Secondary | ICD-10-CM

## 2021-01-28 NOTE — Progress Notes (Signed)
Virtual Visit via Video Note  I connected with [name] on 01/28/21 at 11:30 AM EDT by a video enabled telemedicine application and verified that I am speaking with the correct person using two identifiers.  Location: Patient: home Provider: office   I discussed the limitations of evaluation and management by telemedicine and the availability of in person appointments. The patient expressed understanding and agreed to proceed.  History of Present Illness:  Encounter Diagnoses  Name Primary?   Pain in right hip Yes   Chronic pain of left knee    Chronic pain of right knee    Chronic radicular pain of lower extremity    H/O total knee replacement, right    H/O total knee replacement, left    H/O total hip arthroplasty, left     H/O leg pain and lower back   [name] responded well to the steroids but had some side effects of irritability  The MRI shows multilevel disc disease with ligament flavum hypertrophy facet arthritis mostly involving both the lateral recesses from L2-S1 there is also surgical laminectomy noted at L5-S1    Observations/Objective: L2-L3: Broad-based disc bulging, ligamentum flavum hypertrophy, and facet arthropathy results in mild spinal canal and lateral recess narrowing bilaterally, left greater than right.   L3-L4: Broad-based disc bulging, ligamentum flavum hypertrophy, facet arthropathy result in mild spinal canal narrowing mild lateral recess narrowing bilaterally, left greater than right.   L4-L5: Mild disc bulging, ligamentum flavum hypertrophy, facet arthropathy results in mild spinal canal narrowing and lateral recess narrowing bilaterally, left greater than right.   L5-S1: Postsurgical changes to the right lamina and right paracentral disc. There is mild left-sided disc bulging resulting in mild-moderate left neural foraminal narrowing, unchanged.   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   Mild to moderate left neural foraminal narrowing at L5-S1 with chronic postoperative changes on the right.   Findings are overall similar to prior exam in 2019.     Electronically Signed   By: Caprice Renshaw   On: 01/15/2021 11:08  Assessment and Plan:  Allergies  Allergen Reactions   Morphine    Gabapentin Rash    If we do need again cut the dose in 1/2   Follow Up Instructions: As needed  May need to reduce dose in 1/2  ESI as an option as well    I discussed the assessment and treatment plan with the patient. The patient was provided an opportunity to ask questions and all were answered. The patient agreed with the plan and demonstrated an understanding of the instructions.   The patient was advised to call back or seek an in-person evaluation if the symptoms worsen or if the condition fails to improve as anticipated.  I provided 14 minutes of non-face-to-face time during this encounter.   [name], MD

## 2021-02-06 ENCOUNTER — Other Ambulatory Visit: Payer: Self-pay | Admitting: Orthopedic Surgery

## 2021-02-06 DIAGNOSIS — M25551 Pain in right hip: Secondary | ICD-10-CM

## 2021-02-06 DIAGNOSIS — M25552 Pain in left hip: Secondary | ICD-10-CM

## 2021-03-31 ENCOUNTER — Other Ambulatory Visit: Payer: Self-pay | Admitting: Orthopedic Surgery

## 2021-03-31 DIAGNOSIS — M1611 Unilateral primary osteoarthritis, right hip: Secondary | ICD-10-CM

## 2021-04-09 ENCOUNTER — Ambulatory Visit
Admission: RE | Admit: 2021-04-09 | Discharge: 2021-04-09 | Disposition: A | Discharge status: Home / Self Care | Payer: Medicare Other | Source: Ambulatory Visit | Attending: Orthopedic Surgery | Admitting: Orthopedic Surgery

## 2021-04-09 DIAGNOSIS — M1611 Unilateral primary osteoarthritis, right hip: Secondary | ICD-10-CM

## 2021-04-09 MED ORDER — IOPAMIDOL (ISOVUE-M 200) INJECTION 41%: 1.0000 mL | Freq: Once | INTRAMUSCULAR | Status: DC

## 2021-04-09 MED ORDER — METHYLPREDNISOLONE ACETATE 40 MG/ML INJ SUSP (RADIOLOG: 80.0000 mg | Freq: Once | INTRAMUSCULAR | Status: DC

## 2021-04-16 ENCOUNTER — Other Ambulatory Visit: Payer: Self-pay | Admitting: Orthopedic Surgery

## 2021-04-16 DIAGNOSIS — M25551 Pain in right hip: Secondary | ICD-10-CM

## 2021-04-16 DIAGNOSIS — M25552 Pain in left hip: Secondary | ICD-10-CM

## 2021-06-24 ENCOUNTER — Other Ambulatory Visit (HOSPITAL_COMMUNITY): Payer: Self-pay | Admitting: Adult Health

## 2021-06-24 ENCOUNTER — Other Ambulatory Visit: Payer: Self-pay | Admitting: Orthopedic Surgery

## 2021-06-24 DIAGNOSIS — M25551 Pain in right hip: Secondary | ICD-10-CM

## 2021-06-24 DIAGNOSIS — Z1231 Encounter for screening mammogram for malignant neoplasm of breast: Secondary | ICD-10-CM

## 2021-06-24 DIAGNOSIS — M25552 Pain in left hip: Secondary | ICD-10-CM

## 2021-07-28 ENCOUNTER — Other Ambulatory Visit: Payer: Self-pay

## 2021-07-28 ENCOUNTER — Ambulatory Visit (HOSPITAL_COMMUNITY)
Admission: RE | Admit: 2021-07-28 | Discharge: 2021-07-28 | Disposition: A | Discharge status: Home / Self Care | Payer: Medicare Other | Source: Ambulatory Visit | Attending: Adult Health | Admitting: Adult Health

## 2021-07-28 DIAGNOSIS — Z1231 Encounter for screening mammogram for malignant neoplasm of breast: Secondary | ICD-10-CM | POA: Insufficient documentation

## 2021-08-17 ENCOUNTER — Other Ambulatory Visit: Payer: Self-pay | Admitting: Orthopedic Surgery

## 2021-08-17 DIAGNOSIS — M25551 Pain in right hip: Secondary | ICD-10-CM

## 2021-08-17 DIAGNOSIS — M25552 Pain in left hip: Secondary | ICD-10-CM

## 2021-10-22 ENCOUNTER — Other Ambulatory Visit: Payer: Self-pay | Admitting: Orthopedic Surgery

## 2021-10-22 DIAGNOSIS — M25551 Pain in right hip: Secondary | ICD-10-CM

## 2021-10-22 DIAGNOSIS — M25552 Pain in left hip: Secondary | ICD-10-CM

## 2021-11-12 ENCOUNTER — Encounter: Payer: Self-pay | Admitting: Orthopedic Surgery

## 2021-11-12 ENCOUNTER — Ambulatory Visit: Payer: Medicare Other | Admitting: Orthopedic Surgery

## 2021-11-12 VITALS — BP 149/79 | HR 71 | Ht 65.0 in | Wt 223.4 lb

## 2021-11-12 DIAGNOSIS — Z01818 Encounter for other preprocedural examination: Secondary | ICD-10-CM | POA: Diagnosis not present

## 2021-11-12 DIAGNOSIS — G5602 Carpal tunnel syndrome, left upper limb: Secondary | ICD-10-CM | POA: Diagnosis not present

## 2021-11-12 NOTE — Patient Instructions (Signed)
Your surgery will be at [facility] by Dr Aline Brochure  ?The hospital will contact you with a preoperative appointment to discuss Anesthesia. The phone number is [phone] ? Please bring your medications with you for the appointment. ?They will tell you the arrival time and medication instructions when you have your preoperative evaluation. ?Do not wear nail polish the day of your surgery and if you take Phentermine you need to stop this medication ONE WEEK prior to your surgery.  ? ?Take Vitamin B-6 100mg  twice daily until your surgery for your left carpal tunnel ? ?Your surgery is scheduled for Nov 26, 2021 at [facility] ?

## 2021-11-12 NOTE — Progress Notes (Signed)
EVALUATION AND MANAGEMENT  ?  ?  ?  ?PLAN: [place] has decided to proceed with left carpal tunnel release ?  ?No orders of the defined types were placed in this encounter. ?  ?  ?  ?    ?Chief Complaint  ?Patient presents with  ? Hand Pain  ?    LT hand/ painful for about a year  ?  ?  ?[name] 70 years old she comes in with complaints of left carpal tunnel syndrome which include pain and paresthesias left hand nocturnal symptoms pain with driving with increased numbness and tingling as well as difficulty holding onto small objects ?  ?  ?ROS ?History of lower back pain, no fever chills shortness of breath or chest pain ?  ?Body mass index is 37.18 kg/m?. ?  ?Physical Exam ?BP (!) 149/79   Pulse 71   Ht 5' 5" (1.651 m)   Wt 223 lb 6.4 oz (101.3 kg)   BMI 37.18 kg/m?  ?  ?General appearance this is a well-developed well-nourished female grooming hygiene intact normal nutritional status ? ?She is oriented x3 ? ?She does have a pleasant mood and normal affect ? ?The gait is normal ? ?The left upper extremity is noted to have no swelling it does have full range of motion of the wrist and hand with normal stability in the wrist the grip strength seems normal right to left the skin is intact she has a good pulse color and temperature to the hand there is no lymphadenopathy at the elbow the sensory distribution shows normal two-point discrimination but decreased soft touch sensation no pathologic reflexes and upper extremity coordination was normal ? ?Positive carpal tunnel test includes tenderness over the carpal tunnel positive compression test with a normal Phalen sign ?  ?    ?Past Medical History:  ?Diagnosis Date  ? Anxiety    ?  panic attack  ? Arthritis    ? Carpal tunnel syndrome on both sides    ? Complication of anesthesia    ?  1st knee replacement slow to awaken, 2nd knee replacement - woke up had to have more anesthesia  ? High cholesterol    ? History of kidney stones    ? Knee joint replacement status     ? Shortness of breath dyspnea    ?  with exertion  ?  ?     ?Past Surgical History:  ?Procedure Laterality Date  ? APPENDECTOMY      ? BREAST CYST EXCISION      ? BUNIONECTOMY   09/29/2011  ?  RIGHT  ? CARPAL TUNNEL RELEASE   04/13/2012  ?  rocedure: CARPAL TUNNEL RELEASE;  Surgeon: Audrey Angus E Chardai Gangemi, MD;  Location: [facility];  Service: Orthopedics;  Laterality: Right;  ? COLONOSCOPY N/A 09/11/2015  ?  Procedure: COLONOSCOPY;  Surgeon: Sandi L Fields, MD;  Location: [facility];  Service: Endoscopy;  Laterality: N/A;  1000  ? JOINT REPLACEMENT Bilateral 2008  ?  bilateral knees, [facility]; Theodus Ran  ? KNEE SURGERY      ? LAMINECTOMY      ? ORIF HUMERUS FRACTURE Right 01/11/2016  ?  Procedure: OPEN REDUCTION INTERNAL FIXATION (ORIF) RIGHT PROXIMAL HUMERUS FRACTURE;  Surgeon: Scott Gregory Dean, MD;  Location: [facility];  Service: Orthopedics;  Laterality: Right;  ?  ?Social History  ?  ?     ?Tobacco Use  ? Smoking status: Former  ?    Years: 30.00  ?      Types: Cigarettes  ?    Quit date: 11/26/2010  ?    Years since quitting: 10.9  ? Smokeless tobacco: Never  ?Substance Use Topics  ? Alcohol use: Yes  ?    Alcohol/week: 2.0 standard drinks  ?    Types: 2 Glasses of wine per week  ?    Comment: occasional wine  ? Drug use: No  ?  ?  ?  ?Assessment and Plan: ?  ?Imaging: I have personally reviewed the images and my personal interpretation of the images: No images ?    ?Encounter Diagnoses  ?Name Primary?  ? Pre-op testing Yes  ? Carpal tunnel syndrome, left upper limb    ?  ?  ?Carpal tunnel release left ?

## 2021-11-16 ENCOUNTER — Telehealth: Payer: Self-pay | Admitting: Orthopedic Surgery

## 2021-11-16 NOTE — Telephone Encounter (Signed)
Patient called and wants to know about an injection in her back before surgery?  And she wants to know about stopping medicine before surgery  ? ?Please call her back on her cell phone if she doesn't answer just leave her a detailed message. ?

## 2021-11-16 NOTE — Telephone Encounter (Signed)
Spoke with patient. She is c/o back pain radiating down leg. She stated she was supposed to get an injection prior to sx. Advised her I would send to provider. She asked about any medications to stop. Advised her pre-op would review meds.  ?

## 2021-11-23 ENCOUNTER — Other Ambulatory Visit: Payer: Self-pay

## 2021-11-23 DIAGNOSIS — G5602 Carpal tunnel syndrome, left upper limb: Secondary | ICD-10-CM

## 2021-11-23 NOTE — Patient Instructions (Signed)
? ? ? ? ? ? ? ? Audrey FinchBarbara K Marquez ? 11/23/2021  ?  ? @PREFPERIOPPHARMACY @ ? ? Your procedure is scheduled on  11/26/2021. ? ? [facility].M. ? ? Call this number if you have problems the morning of surgery: ? [phone] ? ? Remember: ? Do not eat or drink after midnight. ?  ?  ? Take these medicines the morning of surgery with A SIP OF WATER   ? ?                                  mobic(if needed). ?  ? ? Do not wear jewelry, make-up or nail polish. ? Do not wear lotions, powders, or perfumes, or deodorant. ? Do not shave 48 hours prior to surgery.  Men may shave face and neck. ? Do not bring valuables to the hospital. ? [facility] is not responsible for any belongings or valuables. ? ?Contacts, dentures or bridgework may not be worn into surgery.  Leave your suitcase in the car.  After surgery it may be brought to your room. ? ?For patients admitted to the hospital, discharge time will be determined by your treatment team. ? ?Patients discharged the day of surgery will not be allowed to drive home and must have someone with them for 24 hours.  ? ? ?Special instructions:   DO NOT smoke tobacco or vape for 24 hours before your procedure. ? ?Please read over the following fact sheets that you were given. ?Coughing and Deep Breathing, Surgical Site Infection Prevention, Anesthesia Post-op Instructions, and Care and Recovery After Surgery ?  ? ? ? Open Carpal Tunnel Release, Care After ?This sheet gives you information about how to care for yourself after your procedure. Your health care provider may also give you more specific instructions. If you have problems or questions, contact your health care provider. ?What can I expect after the procedure? ?After the procedure, it is common to have: ?Pain. ?Swelling. ?Wrist stiffness. ?Bruising. ?Follow these instructions at home: ?Medicines ?Take over-the-counter and prescription medicines only as told by your health care provider. ?Ask your health care  provider if the medicine prescribed to you: ?Requires you to avoid driving or using machinery. ?Can cause constipation. You may need to take these actions to prevent or treat constipation: ?Drink enough fluid to keep your urine pale yellow. ?Take over-the-counter or prescription medicines. ?Eat foods that are high in fiber, such as beans, whole grains, and fresh fruits and vegetables. ?Limit foods that are high in fat and processed sugars, such as fried or sweet foods. ?Bathing ?Do not take baths, swim, or use a hot tub until your health care provider approves. Ask your health care provider if you may take showers. ?Keep your bandage (dressing) dry until your health care provider says it can be removed. Cover it with a watertight covering when you take a bath or a shower. ?If you have a splint or brace: ?Wear the splint or brace as told by your health care provider. You may need to wear it for 2-3 weeks. Remove it only as told by your health care provider. ?Loosen the splint or brace if your fingers tingle, become numb, or turn cold and blue. ?Keep the splint or brace clean. ?If the splint or brace is not waterproof: ?Do not let it get wet. ?Cover it with a watertight covering when you take a bath  or a shower. ?Incision care ? ?After the compression bandage has been removed, follow instructions from your health care provider about how to take care of your incision. Make sure you: ?Wash your hands with soap and water for at least 20 seconds before and after you change your bandage (dressing). If soap and water are not available, use hand sanitizer. ?Change your dressing as told by your health care provider. ?Leave stitches (sutures), skin glue, or adhesive strips in place. These skin closures may need to stay in place for 2 weeks or longer. If adhesive strip edges start to loosen and curl up, you may trim the loose edges. Do not remove adhesive strips completely unless your health care provider tells you to do  that. ?Check your incision area every day for signs of infection. Check for: ?Redness. ?More swelling or pain. ?Fluid or blood. ?Warmth. ?Pus or a bad smell. ?Managing pain, stiffness, and swelling ? ?If directed, put ice on the affected area. ?If you have a removable splint or brace, remove it as told by your health care provider. ?Put ice in a plastic bag. ?Place a towel between your skin and the bag. ?Leave the ice on for 20 minutes, 2-3 times a day. Do not fall asleep with ice pack on your skin. ?Remove the ice if your skin turns bright red. This is very important. If you cannot feel pain, heat, or cold, you have a greater risk of damage to the area. ?Move your fingers often to avoid stiffness and to lessen swelling. ?Raise (elevate) your wrist above the level of your heart while you are sitting or lying down. ?Activity ?Do not drive until your health care provider approves. ?Use your hand carefully. Do not do activities that cause pain. You should be able to do light activities with your hand. ?Do not lift with your affected hand until your health care provider approves. ?Avoid pulling and pushing with the injured arm. ?Return to your normal activities as told by your health care provider. Ask your health care provider what activities are safe for you. ?If physical therapy was prescribed, do exercises as told by your therapist. Physical therapy can help you heal faster and regain movement. ?General instructions ?Do not use any products that contain nicotine or tobacco, such as cigarettes and e-cigarettes. These can delay incision healing after surgery. If you need help quitting, ask your health care provider. ?Keep all follow-up visits. This is important. These include visits for physical therapy. ?Contact a health care provider if: ?You have redness around your incision. ?You have more swelling or pain. ?You have fluid or blood coming from your incision. ?Your incision feels warm to the touch. ?You have pus or  a bad smell coming from your incision. ?You have a fever or chills. ?You have pain that does not get better with medicine. ?Your carpal tunnel symptoms do not go away after 2 months. ?Your carpal tunnel symptoms go away and then come back. ?Get help right away if: ?You have pain or numbness that is getting worse. ?Your fingers or fingertips become very pale or bluish in color. ?You are not able to move your fingers. ?Summary ?It is common to have wrist stiffness and bruising after a carpal tunnel release. ?Icing and raising (elevating) your wrist may help to lessen swelling and pain. ?Call your health care provider if you have a fever or notice any signs of infection in your incision area. ?This information is not intended to replace advice given to you  by your health care provider. Make sure you discuss any questions you have with your health care provider. ?Document Revised: 11/14/2019 Document Reviewed: 11/14/2019 ?Elsevier Patient Education ? 2023 [employer] ?Monitored Anesthesia Care, Care After ?This sheet gives you information about how to care for yourself after your procedure. Your health care provider may also give you more specific instructions. If you have problems or questions, contact your health care provider. ?What can I expect after the procedure? ?After the procedure, it is common to have: ?Tiredness. ?Forgetfulness about what happened after the procedure. ?Impaired judgment for important decisions. ?Nausea or vomiting. ?Some difficulty with balance. ?Follow these instructions at home: ?For the time period you were told by your health care provider: ? ?  ? ?Rest as needed. ?Do not participate in activities where you could fall or become injured. ?Do not drive or use machinery. ?Do not drink alcohol. ?Do not take sleeping pills or medicines that cause drowsiness. ?Do not make important decisions or sign legal documents. ?Do not take care of children on your own. ?Eating and drinking ?Follow the  diet that is recommended by your health care provider. ?Drink enough fluid to keep your urine pale yellow. ?If you vomit: ?Drink water, juice, or soup when you can drink without vomiting. ?Make sure you have lit

## 2021-11-24 ENCOUNTER — Encounter (HOSPITAL_COMMUNITY)
Admission: RE | Admit: 2021-11-24 | Discharge: 2021-11-24 | Disposition: A | Discharge status: Home / Self Care | Payer: Medicare Other | Source: Ambulatory Visit | Attending: Orthopedic Surgery | Admitting: Orthopedic Surgery

## 2021-11-24 ENCOUNTER — Encounter (HOSPITAL_COMMUNITY): Payer: Self-pay

## 2021-11-24 ENCOUNTER — Other Ambulatory Visit: Payer: Self-pay

## 2021-11-24 DIAGNOSIS — Z01812 Encounter for preprocedural laboratory examination: Secondary | ICD-10-CM | POA: Insufficient documentation

## 2021-11-24 DIAGNOSIS — G5602 Carpal tunnel syndrome, left upper limb: Secondary | ICD-10-CM | POA: Diagnosis not present

## 2021-11-24 HISTORY — DX: Essential (primary) hypertension: I10

## 2021-11-24 LAB — BASIC METABOLIC PANEL
Anion gap: 10 (ref 5–15)
BUN: 21 mg/dL (ref 8–23)
CO2: 24 mmol/L (ref 22–32)
Calcium: 9.8 mg/dL (ref 8.9–10.3)
Chloride: 104 mmol/L (ref 98–111)
Creatinine, Ser: 0.78 mg/dL (ref 0.44–1.00)
GFR, Estimated: >60 mL/min (ref >60)
Glucose, Bld: 106 mg/dL — ABNORMAL HIGH (ref 70–99)
Potassium: 4.2 mmol/L (ref 3.5–5.1)
Sodium: 138 mmol/L (ref 135–145)

## 2021-11-24 LAB — CBC
HCT: 39.8 % (ref 36.0–46.0)
Hemoglobin: 13.2 g/dL (ref 12.0–15.0)
MCH: 30.6 pg (ref 26.0–34.0)
MCHC: 33.2 g/dL (ref 30.0–36.0)
MCV: 92.1 fL (ref 80.0–100.0)
Platelets: 232 10*3/uL (ref 150–400)
RBC: 4.32 MIL/uL (ref 3.87–5.11)
RDW: 13.2 % (ref 11.5–15.5)
WBC: 7.5 10*3/uL (ref 4.0–10.5)
nRBC: 0 % (ref 0.0–0.2)

## 2021-11-25 NOTE — H&P (Signed)
EVALUATION AND MANAGEMENT  ?  ?  ?  ?PLAN: [name] has decided to proceed with left carpal tunnel release ?  ?No orders of the defined types were placed in this encounter. ?  ?  ?  ?    ?Chief Complaint  ?Patient presents with  ? Hand Pain  ?    LT hand/ painful for about a year  ?  ?  ?[name] 71 years old she comes in with complaints of left carpal tunnel syndrome which include pain and paresthesias left hand nocturnal symptoms pain with driving with increased numbness and tingling as well as difficulty holding onto small objects ?  ?  ?ROS ?History of lower back pain, no fever chills shortness of breath or chest pain ?  ?Body mass index is 37.18 kg/m?. ?  ?Physical Exam ?BP (!) 149/79   Pulse 71   Ht 5\' 5"  (1.651 m)   Wt 223 lb 6.4 oz (101.3 kg)   BMI 37.18 kg/m?  ?  ?General appearance this is a well-developed well-nourished female grooming hygiene intact normal nutritional status ? ?She is oriented x3 ? ?She does have a pleasant mood and normal affect ? ?The gait is normal ? ?The left upper extremity is noted to have no swelling it does have full range of motion of the wrist and hand with normal stability in the wrist the grip strength seems normal right to left the skin is intact she has a good pulse color and temperature to the hand there is no lymphadenopathy at the elbow the sensory distribution shows normal two-point discrimination but decreased soft touch sensation no pathologic reflexes and upper extremity coordination was normal ? ?Positive carpal tunnel test includes tenderness over the carpal tunnel positive compression test with a normal Phalen sign ?  ?    ?Past Medical History:  ?Diagnosis Date  ? Anxiety    ?  panic attack  ? Arthritis    ? Carpal tunnel syndrome on both sides    ? Complication of anesthesia    ?  1st knee replacement slow to awaken, 2nd knee replacement - woke up had to have more anesthesia  ? High cholesterol    ? History of kidney stones    ? Knee joint replacement status     ? Shortness of breath dyspnea    ?  with exertion  ?  ?     ?Past Surgical History:  ?Procedure Laterality Date  ? APPENDECTOMY      ? BREAST CYST EXCISION      ? BUNIONECTOMY   09/29/2011  ?  RIGHT  ? CARPAL TUNNEL RELEASE   04/13/2012  ?  rocedure: CARPAL TUNNEL RELEASE;  Surgeon: Carole Civil, MD;  Location: [facility];  Service: Orthopedics;  Laterality: Right;  ? COLONOSCOPY N/A 09/11/2015  ?  Procedure: COLONOSCOPY;  Surgeon: Danie Binder, MD;  Location[facility];  Service: Endoscopy;  Laterality: N/A;  1000  ? JOINT REPLACEMENT Bilateral 2008  ?  bilateral knees, [facility]; Aline Brochure  ? KNEE SURGERY      ? LAMINECTOMY      ? ORIF HUMERUS FRACTURE Right 01/11/2016  ?  Procedure: OPEN REDUCTION INTERNAL FIXATION (ORIF) RIGHT PROXIMAL HUMERUS FRACTURE;  Surgeon: Meredith Pel, MD;  Location: [facility];  Service: Orthopedics;  Laterality: Right;  ?  ?Social History  ?  ?     ?Tobacco Use  ? Smoking status: Former  ?    Years: 30.00  ?  Types: Cigarettes  ?    Quit date: 11/26/2010  ?    Years since quitting: 10.9  ? Smokeless tobacco: Never  ?Substance Use Topics  ? Alcohol use: Yes  ?    Alcohol/week: 2.0 standard drinks  ?    Types: 2 Glasses of wine per week  ?    Comment: occasional wine  ? Drug use: No  ?  ?  ?  ?Assessment and Plan: ?  ?Imaging: I have personally reviewed the images and my personal interpretation of the images: No images ?    ?Encounter Diagnoses  ?Name Primary?  ? Pre-op testing Yes  ? Carpal tunnel syndrome, left upper limb    ?  ?  ?Carpal tunnel release left ?

## 2021-11-26 ENCOUNTER — Other Ambulatory Visit: Payer: Self-pay

## 2021-11-26 ENCOUNTER — Ambulatory Visit (HOSPITAL_COMMUNITY): Payer: Medicare Other | Admitting: Anesthesiology

## 2021-11-26 ENCOUNTER — Ambulatory Visit (HOSPITAL_BASED_OUTPATIENT_CLINIC_OR_DEPARTMENT_OTHER): Payer: Medicare Other | Admitting: Anesthesiology

## 2021-11-26 ENCOUNTER — Ambulatory Visit (HOSPITAL_COMMUNITY)
Admission: RE | Admit: 2021-11-26 | Discharge: 2021-11-26 | Disposition: A | Discharge status: Home / Self Care | Payer: Medicare Other | Attending: Orthopedic Surgery | Admitting: Orthopedic Surgery

## 2021-11-26 ENCOUNTER — Encounter (HOSPITAL_COMMUNITY): Payer: Self-pay | Admitting: Orthopedic Surgery

## 2021-11-26 ENCOUNTER — Encounter (HOSPITAL_COMMUNITY)
Admission: RE | Disposition: A | Discharge status: Home / Self Care | Payer: Self-pay | Source: Home / Self Care | Attending: Orthopedic Surgery

## 2021-11-26 DIAGNOSIS — I10 Essential (primary) hypertension: Secondary | ICD-10-CM | POA: Diagnosis not present

## 2021-11-26 DIAGNOSIS — M1990 Unspecified osteoarthritis, unspecified site: Secondary | ICD-10-CM | POA: Insufficient documentation

## 2021-11-26 DIAGNOSIS — Z87891 Personal history of nicotine dependence: Secondary | ICD-10-CM | POA: Diagnosis not present

## 2021-11-26 DIAGNOSIS — G5602 Carpal tunnel syndrome, left upper limb: Secondary | ICD-10-CM

## 2021-11-26 HISTORY — PX: CARPAL TUNNEL RELEASE: SHX101

## 2021-11-26 SURGERY — CARPAL TUNNEL RELEASE
Anesthesia: General | Site: Hand | Laterality: Left

## 2021-11-26 MED ORDER — KETAMINE HCL 10 MG/ML IJ SOLN
INTRAMUSCULAR | Status: DC | PRN
  Administered 2021-11-26: 20 mg via INTRAVENOUS

## 2021-11-26 MED ORDER — 0.9 % SODIUM CHLORIDE (POUR BTL) OPTIME
TOPICAL | Status: DC | PRN
  Administered 2021-11-26: 1000 mL

## 2021-11-26 MED ORDER — PROPOFOL 500 MG/50ML IV EMUL
INTRAVENOUS | Status: DC | PRN
Start: 2021-11-26 — End: 2021-11-26
  Administered 2021-11-26: 60 ug/kg/min via INTRAVENOUS

## 2021-11-26 MED ORDER — CHLORHEXIDINE GLUCONATE 0.12 % MT SOLN
15.0000 mL | Freq: Once | OROMUCOSAL | Status: AC
  Administered 2021-11-26: 15 mL via OROMUCOSAL

## 2021-11-26 MED ORDER — ONDANSETRON HCL 4 MG/2ML IJ SOLN: 4.0000 mg | Freq: Once | INTRAMUSCULAR | Status: DC | PRN

## 2021-11-26 MED ORDER — DEXAMETHASONE SODIUM PHOSPHATE 4 MG/ML IJ SOLN
INTRAMUSCULAR | Status: DC | PRN
  Administered 2021-11-26: 5 mg via INTRAVENOUS

## 2021-11-26 MED ORDER — FENTANYL CITRATE PF 50 MCG/ML IJ SOSY: 25.0000 ug | PREFILLED_SYRINGE | INTRAMUSCULAR | Status: DC | PRN

## 2021-11-26 MED ORDER — CEFAZOLIN IN SODIUM CHLORIDE 3-0.9 GM/100ML-% IV SOLN: 3.0000 g | INTRAVENOUS | Status: DC

## 2021-11-26 MED ORDER — PROPOFOL 10 MG/ML IV BOLUS
INTRAVENOUS | Status: DC | PRN
  Administered 2021-11-26 (×3): 20 mg via INTRAVENOUS

## 2021-11-26 MED ORDER — CEFAZOLIN SODIUM-DEXTROSE 2-4 GM/100ML-% IV SOLN
2.0000 g | Freq: Once | INTRAVENOUS | Status: AC
  Administered 2021-11-26: 2 g via INTRAVENOUS

## 2021-11-26 MED ORDER — PROPOFOL 10 MG/ML IV BOLUS
INTRAVENOUS | Status: AC
  Filled 2021-11-26: qty 20

## 2021-11-26 MED ORDER — MIDAZOLAM HCL 2 MG/2ML IJ SOLN
INTRAMUSCULAR | Status: AC
  Filled 2021-11-26: qty 2

## 2021-11-26 MED ORDER — LIDOCAINE HCL (PF) 0.5 % IJ SOLN
INTRAMUSCULAR | Status: AC
  Filled 2021-11-26: qty 50

## 2021-11-26 MED ORDER — BUPIVACAINE-EPINEPHRINE (PF) 0.5% -1:200000 IJ SOLN
INTRAMUSCULAR | Status: AC
  Filled 2021-11-26: qty 30

## 2021-11-26 MED ORDER — LIDOCAINE HCL (PF) 2 % IJ SOLN
INTRAMUSCULAR | Status: AC
  Filled 2021-11-26: qty 5

## 2021-11-26 MED ORDER — FENTANYL CITRATE (PF) 100 MCG/2ML IJ SOLN
INTRAMUSCULAR | Status: DC | PRN
  Administered 2021-11-26 (×2): 50 ug via INTRAVENOUS

## 2021-11-26 MED ORDER — LIDOCAINE 2% (20 MG/ML) 5 ML SYRINGE
INTRAMUSCULAR | Status: DC | PRN
  Administered 2021-11-26: 60 mg via INTRAVENOUS

## 2021-11-26 MED ORDER — ORAL CARE MOUTH RINSE: 15.0000 mL | Freq: Once | OROMUCOSAL | Status: AC

## 2021-11-26 MED ORDER — LIDOCAINE HCL (PF) 1 % IJ SOLN
INTRAMUSCULAR | Status: AC
  Filled 2021-11-26: qty 30

## 2021-11-26 MED ORDER — DEXAMETHASONE SODIUM PHOSPHATE 10 MG/ML IJ SOLN
INTRAMUSCULAR | Status: AC
  Filled 2021-11-26: qty 1

## 2021-11-26 MED ORDER — DIPHENHYDRAMINE HCL 25 MG PO CAPS
25.0000 mg | ORAL_CAPSULE | ORAL | 0 refills | Status: DC | PRN
Start: 2021-11-26 — End: 2024-03-26

## 2021-11-26 MED ORDER — BUPIVACAINE HCL (PF) 0.5 % IJ SOLN
INTRAMUSCULAR | Status: DC | PRN
  Administered 2021-11-26: 10 mL

## 2021-11-26 MED ORDER — FENTANYL CITRATE (PF) 100 MCG/2ML IJ SOLN
INTRAMUSCULAR | Status: AC
  Filled 2021-11-26: qty 2

## 2021-11-26 MED ORDER — LACTATED RINGERS IV SOLN: INTRAVENOUS | Status: DC

## 2021-11-26 MED ORDER — ONDANSETRON HCL 4 MG/2ML IJ SOLN
INTRAMUSCULAR | Status: AC
  Filled 2021-11-26: qty 2

## 2021-11-26 MED ORDER — KETAMINE HCL 50 MG/5ML IJ SOSY
PREFILLED_SYRINGE | INTRAMUSCULAR | Status: AC
  Filled 2021-11-26: qty 5

## 2021-11-26 MED ORDER — LIDOCAINE HCL (PF) 0.5 % IJ SOLN
INTRAMUSCULAR | Status: DC | PRN
  Administered 2021-11-26: 200 mg via INTRAVENOUS

## 2021-11-26 MED ORDER — HYDROCODONE-ACETAMINOPHEN 5-325 MG PO TABS: 1.0000 | ORAL_TABLET | Freq: Four times a day (QID) | ORAL | 0 refills | Status: DC | PRN

## 2021-11-26 MED ORDER — KETOROLAC TROMETHAMINE 30 MG/ML IJ SOLN
INTRAMUSCULAR | Status: DC | PRN
  Administered 2021-11-26: 30 mg via INTRAVENOUS

## 2021-11-26 MED ORDER — MIDAZOLAM HCL 5 MG/5ML IJ SOLN
INTRAMUSCULAR | Status: DC | PRN
Start: 2021-11-26 — End: 2021-11-26
  Administered 2021-11-26: 2 mg via INTRAVENOUS

## 2021-11-26 MED ORDER — ONDANSETRON HCL 4 MG/2ML IJ SOLN
INTRAMUSCULAR | Status: DC | PRN
Start: 2021-11-26 — End: 2021-11-26
  Administered 2021-11-26: 4 mg via INTRAVENOUS

## 2021-11-26 MED ORDER — KETOROLAC TROMETHAMINE 30 MG/ML IJ SOLN
INTRAMUSCULAR | Status: AC
  Filled 2021-11-26: qty 1

## 2021-11-26 SURGICAL SUPPLY — 38 items
BANDAGE ESMARK 4X12 BL STRL LF (DISPOSABLE) ×1 IMPLANT
BLADE SURG 15 STRL LF DISP TIS (BLADE) ×1 IMPLANT
BLADE SURG 15 STRL SS (BLADE) ×2
BNDG ELASTIC 3X5.8 VLCR NS LF (GAUZE/BANDAGES/DRESSINGS) ×2 IMPLANT
BNDG ESMARK 4X12 BLUE STRL LF (DISPOSABLE) ×2
BNDG GAUZE ELAST 4 BULKY (GAUZE/BANDAGES/DRESSINGS) ×2 IMPLANT
CHLORAPREP W/TINT 26 (MISCELLANEOUS) ×2 IMPLANT
CLOTH BEACON ORANGE TIMEOUT ST (SAFETY) ×2 IMPLANT
COVER LIGHT HANDLE STERIS (MISCELLANEOUS) ×4 IMPLANT
CUFF TOURN SGL QUICK 18X4 (TOURNIQUET CUFF) ×2 IMPLANT
DRAPE HALF SHEET 40X57 (DRAPES) ×2 IMPLANT
ELECT NDL TIP 2.8 STRL (NEEDLE) IMPLANT
ELECT NEEDLE TIP 2.8 STRL (NEEDLE) ×2 IMPLANT
ELECT REM PT RETURN 9FT ADLT (ELECTROSURGICAL) ×2
ELECTRODE REM PT RTRN 9FT ADLT (ELECTROSURGICAL) ×1 IMPLANT
GAUZE 4X4 16PLY ~~LOC~~+RFID DBL (SPONGE) ×2 IMPLANT
GAUZE SPONGE 4X4 12PLY STRL (GAUZE/BANDAGES/DRESSINGS) ×2 IMPLANT
GAUZE XEROFORM 1X8 LF (GAUZE/BANDAGES/DRESSINGS) ×2 IMPLANT
GLOVE BIOGEL PI IND STRL 7.0 (GLOVE) ×2 IMPLANT
GLOVE BIOGEL PI IND STRL 8.5 (GLOVE) IMPLANT
GLOVE BIOGEL PI INDICATOR 7.0 (GLOVE) ×3
GLOVE BIOGEL PI INDICATOR 8.5 (GLOVE) ×1
GLOVE SKINSENSE NS SZ8.0 LF (GLOVE) ×1
GLOVE SKINSENSE STRL SZ8.0 LF (GLOVE) IMPLANT
GOWN STRL REUS W/TWL LRG LVL3 (GOWN DISPOSABLE) ×2 IMPLANT
GOWN STRL REUS W/TWL XL LVL3 (GOWN DISPOSABLE) ×2 IMPLANT
KIT TURNOVER KIT A (KITS) ×2 IMPLANT
MANIFOLD NEPTUNE II (INSTRUMENTS) ×2 IMPLANT
NDL HYPO 21X1.5 SAFETY (NEEDLE) ×2 IMPLANT
NEEDLE HYPO 21X1.5 SAFETY (NEEDLE) ×2 IMPLANT
NS IRRIG 1000ML POUR BTL (IV SOLUTION) ×2 IMPLANT
PACK BASIC LIMB (CUSTOM PROCEDURE TRAY) ×2 IMPLANT
PAD ARMBOARD 7.5X6 YLW CONV (MISCELLANEOUS) ×2 IMPLANT
POSITIONER HAND ALUMI XLG (MISCELLANEOUS) ×2 IMPLANT
SET BASIN LINEN APH (SET/KITS/TRAYS/PACK) ×2 IMPLANT
SPIKE FLUID TRANSFER (MISCELLANEOUS) ×1 IMPLANT
SUT ETHILON 3 0 FSL (SUTURE) ×2 IMPLANT
SYR CONTROL 10ML LL (SYRINGE) ×2 IMPLANT

## 2021-11-26 NOTE — Transfer of Care (Signed)
Immediate Anesthesia Transfer of Care Note ? ?Patient: [name] ? ?Procedure(s) Performed: CARPAL TUNNEL RELEASE (Left: Hand) ? ?Patient Location: PACU ? ?Anesthesia Type:General ? ?Level of Consciousness: awake, alert  and oriented ? ?Airway & Oxygen Therapy: Patient Spontanous Breathing ? ?Post-op Assessment: Report given to RN and Post -op Vital signs reviewed and stable ? ?Post vital signs: Reviewed and stable ? ?Last Vitals:  ?Vitals Value Taken Time  ?BP    ?Temp    ?Pulse    ?Resp 12 11/26/21 0821  ?SpO2    ?Vitals shown include unvalidated device data. ? ?Last Pain:  ?Vitals:  ? 11/26/21 0659  ?TempSrc: (P) Oral  ?PainSc: (P) 2   ?   ? ?Patients Stated Pain Goal: (P) 4 (11/26/21 0659) ? ?Complications: No notable events documented. ?

## 2021-11-26 NOTE — Op Note (Signed)
11/26/2021 ? ?8:34 AM ? ?PATIENT:  [name]  70 y.o. female ? ?Date 11/26/2021 ? ? ?PRE-OPERATIVE DIAGNOSIS:  LEFT CARPAL TUNNEL SYNDROME ? ?POST-OPERATIVE DIAGNOSIS:  LEFT CARPAL TUNNEL SYNDROME ? ?PROCEDURE:  Procedure(s): ?LEFT CARPAL TUNNEL RELEASE (Right) ? ? ?FINDINGS: Median nerve compression mild with mild discoloration no tenosynovitis noted ? ?SURGEON:  Surgeon(s) and Role: ?   * Jaely Silman E, MD - Primary ? ?PHYSICIAN ASSISTANT:  ? ?ASSISTANTS: none  ? ?ANESTHESIA:   regional ? ?BLOOD ADMINISTERED:none ? ?DRAINS: none  ? ?LOCAL MEDICATIONS USED:  MARCAINE    ? ?SPECIMEN:  No Specimen ? ?DISPOSITION OF SPECIMEN:  N/A ? ?COUNTS:  YES ? ?TOURNIQUET: 32 minutes ? ?DICTATION: .Dragon Dictation  ? ?Carpal tunnel release left wrist  ? ?Surgeon Luretha Eberly ? ?Anesthesia Bier block ? ?Indications failure of conservative treatment to relieve pain and paresthesias and numbness and tingling of the left hand ? ?The patient was identified in the preop area we confirm the surgical site marked as left wrist chart update completed. Patient taken to surgery.  Antibiotic Ancef 2 g after establishing a successful anesthesia the   left arm was prepped with ChloraPrep ? ?Timeout executed completed and confirmed site.  Left wrist ? ?A straight incision was made over the left carpal tunnel in line with the radial border of the ring finger. Blunt dissection was carried out to find the distal aspect of the carpal tunnel. A blunted instrument was passed beneath the carpal tunnel. Sharp incision was then used to release the transverse carpal ligament. The contents of the carpal tunnel were inspected. The median nerve was mildly discolored and mildly compressed ? ?The wound was irrigated and then closed with 3-0 nylon suture. We injected 10 mL of plain Marcaine on the radial side of the incision ? ?A sterile bandage was applied and the tourniquet was released the color of the hand and capillary refill were normal ? ?The  patient was taken to the recovery room in stable condition ? ?PLAN OF CARE: Discharge to home after PACU ? ?PATIENT DISPOSITION:  PACU - hemodynamically stable. ?  ?Delay start of Pharmacological VTE agent (>24hrs) due to surgical blood loss or risk of bleeding: not applicable  ?

## 2021-11-26 NOTE — Anesthesia Preprocedure Evaluation (Addendum)
Anesthesia Evaluation  ?Patient identified by MRN, date of birth, ID band ?Patient awake ? ? ? ?Reviewed: ?Allergy & Precautions, NPO status , Patient's Chart, lab work & pertinent test results ? ?History of Anesthesia Complications ?(+) AWARENESS UNDER ANESTHESIA, PROLONGED EMERGENCE and history of anesthetic complications ? ?Airway ?Mallampati: II ? ?TM Distance: >3 FB ?Neck ROM: Full ? ? ? Dental ? ?(+) Dental Advisory Given, Upper Dentures, Lower Dentures ?  ?Pulmonary ?shortness of breath and with exertion, former smoker,  ?  ?Pulmonary exam normal ?breath sounds clear to auscultation ? ? ? ? ? ? Cardiovascular ?Exercise Tolerance: Good ?hypertension, Pt. on medications ?+ DOE  ?Normal cardiovascular exam ?Rhythm:Regular Rate:Normal ? ? ?  ?Neuro/Psych ?PSYCHIATRIC DISORDERS Anxiety  Neuromuscular disease   ? GI/Hepatic ?negative GI ROS, Neg liver ROS,   ?Endo/Other  ?negative endocrine ROS ? Renal/GU ?negative Renal ROS  ?negative genitourinary ?  ?Musculoskeletal ? ?(+) Arthritis , Osteoarthritis,   ? Abdominal ?  ?Peds ?negative pediatric ROS ?(+)  Hematology ?negative hematology ROS ?(+)   ?Anesthesia Other Findings ? ? Reproductive/Obstetrics ?negative OB ROS ? ?  ? ? ? ? ? ? ? ? ? ? ? ? ? ?  ?  ? ? ? ? ? ? ?Anesthesia Physical ?Anesthesia Plan ? ?ASA: 2 ? ?Anesthesia Plan: General and Pharmacist, community Block-LIDOCAINE ONLY  ? ?Post-op Pain Management: Minimal or no pain anticipated  ? ?Induction: Intravenous ? ?PONV Risk Score and Plan: 3 and Ondansetron and TIVA ? ?Airway Management Planned: Natural Airway, Nasal Cannula and Simple Face Mask ? ?Additional Equipment:  ? ?Intra-op Plan:  ? ?Post-operative Plan:  ? ?Informed Consent: I have reviewed the patients History and Physical, chart, labs and discussed the procedure including the risks, benefits and alternatives for the proposed anesthesia with the patient or authorized representative who has indicated his/her  understanding and acceptance.  ? ? ? ?Dental advisory given ? ?Plan Discussed with: CRNA and Surgeon ? ?Anesthesia Plan Comments: (Bier block and TIVA)  ? ? ? ? ?Anesthesia Quick Evaluation ? ?

## 2021-11-26 NOTE — Brief Op Note (Signed)
11/26/2021 ? ?8:34 AM ? ?PATIENT:  [name]  71 y.o. female ? ?Date 11/26/2021 ? ? ?PRE-OPERATIVE DIAGNOSIS:  LEFT CARPAL TUNNEL SYNDROME ? ?POST-OPERATIVE DIAGNOSIS:  LEFT CARPAL TUNNEL SYNDROME ? ?PROCEDURE:  Procedure(s): ?LEFT CARPAL TUNNEL RELEASE (Right) ? ? ?FINDINGS: Median nerve compression mild with mild discoloration no tenosynovitis noted ? ?SURGEON:  Surgeon(s) and Role: ?   Carole Civil, MD - Primary ? ?PHYSICIAN ASSISTANT:  ? ?ASSISTANTS: none  ? ?ANESTHESIA:   regional ? ?BLOOD ADMINISTERED:none ? ?DRAINS: none  ? ?LOCAL MEDICATIONS USED:  MARCAINE    ? ?SPECIMEN:  No Specimen ? ?DISPOSITION OF SPECIMEN:  N/A ? ?COUNTS:  YES ? ?TOURNIQUET: 32 minutes ? ?DICTATION: .Dragon Dictation  ? ?Carpal tunnel release left wrist  ? ?Surgeon Aline Brochure ? ?Anesthesia Bier block ? ?Indications failure of conservative treatment to relieve pain and paresthesias and numbness and tingling of the left hand ? ?The patient was identified in the preop area we confirm the surgical site marked as left wrist chart update completed. Patient taken to surgery.  Antibiotic Ancef 2 g after establishing a successful anesthesia the   left arm was prepped with ChloraPrep ? ?Timeout executed completed and confirmed site.  Left wrist ? ?A straight incision was made over the left carpal tunnel in line with the radial border of the ring finger. Blunt dissection was carried out to find the distal aspect of the carpal tunnel. A blunted instrument was passed beneath the carpal tunnel. Sharp incision was then used to release the transverse carpal ligament. The contents of the carpal tunnel were inspected. The median nerve was mildly discolored and mildly compressed ? ?The wound was irrigated and then closed with 3-0 nylon suture. We injected 10 mL of plain Marcaine on the radial side of the incision ? ?A sterile bandage was applied and the tourniquet was released the color of the hand and capillary refill were normal ? ?The  patient was taken to the recovery room in stable condition ? ?PLAN OF CARE: Discharge to home after PACU ? ?PATIENT DISPOSITION:  PACU - hemodynamically stable. ?  ?Delay start of Pharmacological VTE agent (>24hrs) due to surgical blood loss or risk of bleeding: not applicable  ?

## 2021-11-26 NOTE — Interval H&P Note (Signed)
History and Physical Interval Note: ? ?11/26/2021 ?7:23 AM ? ?[name]  has presented today for surgery, with the diagnosis of Carpal tunnel syndrome left upper extremity.  The various methods of treatment have been discussed with the patient and family. After consideration of risks, benefits and other options for treatment, the patient has consented to  Procedure(s): ?CARPAL TUNNEL RELEASE (Left) as a surgical intervention.  The patient's history has been reviewed, patient examined, no change in status, stable for surgery.  I have reviewed the patient's chart and labs.  Questions were answered to the patient's satisfaction.   ? ? ?[name] ? ? ?

## 2021-11-26 NOTE — Anesthesia Postprocedure Evaluation (Signed)
Anesthesia Post Note ? ?Patient: [name] ? ?Procedure(s) Performed: CARPAL TUNNEL RELEASE (Left: Hand) ? ?Patient location during evaluation: Phase II ?Anesthesia Type: General ?Level of consciousness: awake and alert and oriented ?Pain management: pain level controlled ?Vital Signs Assessment: post-procedure vital signs reviewed and stable ?Respiratory status: spontaneous breathing, nonlabored ventilation and respiratory function stable ?Cardiovascular status: blood pressure returned to baseline and stable ?Postop Assessment: no apparent nausea or vomiting ?Anesthetic complications: no ? ? ?No notable events documented. ? ? ?Last Vitals:  ?Vitals:  ? 11/26/21 0900 11/26/21 0916  ?BP: (!) 153/77 (!) 150/71  ?Pulse: 72 78  ?Resp: 17 18  ?Temp:  36.4 ?C  ?SpO2: 99% 97%  ?  ?Last Pain:  ?Vitals:  ? 11/26/21 0916  ?TempSrc: Oral  ?PainSc: 0-No pain  ? ? ?  ?  ?  ?  ?  ?  ? ?Cheronda Erck C Sue Mcalexander ? ? ? ? ?

## 2021-11-29 ENCOUNTER — Encounter (HOSPITAL_COMMUNITY): Payer: Self-pay | Admitting: Orthopedic Surgery

## 2021-12-09 ENCOUNTER — Ambulatory Visit (INDEPENDENT_AMBULATORY_CARE_PROVIDER_SITE_OTHER): Payer: Medicare Other | Admitting: Orthopedic Surgery

## 2021-12-09 DIAGNOSIS — Z9889 Other specified postprocedural states: Secondary | ICD-10-CM

## 2021-12-09 NOTE — Progress Notes (Signed)
FOLLOW UP   Encounter Diagnosis  Name Primary?   S/P carpal tunnel release left 11/26/2021 Yes     Chief Complaint  Patient presents with   Follow-up    Recheck on CTR, left, DOS 11-26-21.     She is doing well she just has a little soreness at the incision no erythema sutures removed slight decrease sensation at the end of the thumb otherwise doing very well  Follow-up regarding that as needed

## 2022-06-19 ENCOUNTER — Other Ambulatory Visit: Payer: Self-pay | Admitting: Orthopedic Surgery

## 2022-06-19 DIAGNOSIS — M25552 Pain in left hip: Secondary | ICD-10-CM

## 2022-06-19 DIAGNOSIS — M25551 Pain in right hip: Secondary | ICD-10-CM

## 2022-06-24 ENCOUNTER — Other Ambulatory Visit (HOSPITAL_COMMUNITY): Payer: Self-pay | Admitting: Adult Health

## 2022-06-24 DIAGNOSIS — Z1231 Encounter for screening mammogram for malignant neoplasm of breast: Secondary | ICD-10-CM

## 2022-08-03 ENCOUNTER — Ambulatory Visit (HOSPITAL_COMMUNITY)
Admission: RE | Admit: 2022-08-03 | Discharge: 2022-08-03 | Disposition: A | Discharge status: Home / Self Care | Payer: Medicare Other | Source: Ambulatory Visit | Attending: Adult Health | Admitting: Adult Health

## 2022-08-03 ENCOUNTER — Other Ambulatory Visit (HOSPITAL_COMMUNITY): Payer: Self-pay | Admitting: Family Medicine

## 2022-08-03 DIAGNOSIS — Z780 Asymptomatic menopausal state: Secondary | ICD-10-CM

## 2022-08-03 DIAGNOSIS — Z1231 Encounter for screening mammogram for malignant neoplasm of breast: Secondary | ICD-10-CM | POA: Diagnosis not present

## 2022-08-05 ENCOUNTER — Ambulatory Visit (HOSPITAL_COMMUNITY)
Admission: RE | Admit: 2022-08-05 | Discharge: 2022-08-05 | Disposition: A | Discharge status: Home / Self Care | Payer: Medicare Other | Source: Ambulatory Visit | Attending: Family Medicine | Admitting: Family Medicine

## 2022-08-05 DIAGNOSIS — Z780 Asymptomatic menopausal state: Secondary | ICD-10-CM | POA: Insufficient documentation

## 2022-11-28 ENCOUNTER — Other Ambulatory Visit (HOSPITAL_COMMUNITY): Payer: Self-pay | Admitting: Family Medicine

## 2022-11-28 DIAGNOSIS — M25551 Pain in right hip: Secondary | ICD-10-CM

## 2022-12-02 ENCOUNTER — Ambulatory Visit (HOSPITAL_COMMUNITY)
Admission: RE | Admit: 2022-12-02 | Discharge: 2022-12-02 | Disposition: A | Discharge status: Home / Self Care | Payer: Medicare Other | Source: Ambulatory Visit | Attending: Family Medicine | Admitting: Family Medicine

## 2022-12-02 DIAGNOSIS — M25551 Pain in right hip: Secondary | ICD-10-CM

## 2022-12-12 ENCOUNTER — Other Ambulatory Visit (HOSPITAL_COMMUNITY): Payer: Self-pay | Admitting: Family Medicine

## 2022-12-12 DIAGNOSIS — M1611 Unilateral primary osteoarthritis, right hip: Secondary | ICD-10-CM

## 2022-12-12 DIAGNOSIS — M25551 Pain in right hip: Secondary | ICD-10-CM

## 2022-12-24 ENCOUNTER — Other Ambulatory Visit: Payer: Self-pay | Admitting: Orthopedic Surgery

## 2022-12-24 DIAGNOSIS — M25552 Pain in left hip: Secondary | ICD-10-CM

## 2022-12-24 DIAGNOSIS — M25551 Pain in right hip: Secondary | ICD-10-CM

## 2023-01-10 ENCOUNTER — Ambulatory Visit (HOSPITAL_COMMUNITY)
Admission: RE | Admit: 2023-01-10 | Discharge: 2023-01-10 | Disposition: A | Discharge status: Home / Self Care | Payer: Medicare Other | Source: Ambulatory Visit | Attending: Family Medicine | Admitting: Family Medicine

## 2023-01-10 DIAGNOSIS — M1611 Unilateral primary osteoarthritis, right hip: Secondary | ICD-10-CM | POA: Diagnosis present

## 2023-01-10 DIAGNOSIS — M25551 Pain in right hip: Secondary | ICD-10-CM | POA: Insufficient documentation

## 2023-03-02 ENCOUNTER — Encounter: Payer: Self-pay | Admitting: Internal Medicine

## 2023-03-02 ENCOUNTER — Ambulatory Visit: Payer: Medicare Other | Admitting: Internal Medicine

## 2023-03-02 VITALS — BP 118/68 | HR 91 | Ht 64.0 in | Wt 149.4 lb

## 2023-03-02 DIAGNOSIS — M1611 Unilateral primary osteoarthritis, right hip: Secondary | ICD-10-CM

## 2023-03-02 DIAGNOSIS — I10 Essential (primary) hypertension: Secondary | ICD-10-CM | POA: Diagnosis not present

## 2023-03-02 DIAGNOSIS — F411 Generalized anxiety disorder: Secondary | ICD-10-CM

## 2023-03-02 DIAGNOSIS — E782 Mixed hyperlipidemia: Secondary | ICD-10-CM | POA: Diagnosis not present

## 2023-03-02 DIAGNOSIS — R7303 Prediabetes: Secondary | ICD-10-CM | POA: Insufficient documentation

## 2023-03-02 DIAGNOSIS — Z79899 Other long term (current) drug therapy: Secondary | ICD-10-CM

## 2023-03-02 NOTE — Assessment & Plan Note (Signed)
No recent lipid panel on file for review.  She is currently prescribed atorvastatin 40 mg daily.  States that her labs were updated by her PCP recently and her cholesterol was well-controlled. -No medication changes today.  Repeat lipid panel at follow-up in 6 months.

## 2023-03-02 NOTE — Assessment & Plan Note (Signed)
She is currently prescribed alprazolam 2 mg nightly and 1 mg every morning as needed.  PDMP reviewed and is appropriate. -No medication changes have been made today.  Controlled substance agreement signed.  UDS pending.

## 2023-03-02 NOTE — Assessment & Plan Note (Addendum)
Severe right hip OA noted on MRI obtained in June.  She is followed by orthopedic surgery.  Previously underwent left hip arthroplasty with Dr. Linna Caprice of [facility] in 2021. History of bilateral knee arthroplasty with Dr. Romeo Apple in 2008.  Most recently, she has discussed intra-articular corticosteroid injection of the right hip with her PCP.  Denies pain currently and is using meloxicam as needed.  I have recommended that she follow-up with orthopedic surgery to continue discussions regarding steroid injection if pain worsens.

## 2023-03-02 NOTE — Progress Notes (Signed)
New Patient Office Visit  Subjective    Patient ID: ORBA LAFLAIR, female    DOB: Nov 12, 1950  Age: 72 y.o. MRN: [id number]  CC:  Chief Complaint  Patient presents with   Establish Care    HPI [name] presents to establish care.  She is a 72 year old woman who endorses a past medical history significant for HTN, HLD, anxiety, prediabetes, and right hip osteoarthritis.  Previously followed by Dr. Sudie Bailey.  [name] reports feeling well today.  She is asymptomatic currently and has no acute concerns to discuss aside from desiring to establish care.  She is currently retired and previously worked in an [employer]ing [employer].  She denies tobacco, alcohol, and illicit drug use.  Chronic medical conditions and outstanding preventative care items discussed today are individually addressed A/P below.  Outpatient Encounter Medications as of 03/02/2023  Medication Sig   ALPRAZolam (XANAX) 1 MG tablet Take 1 mg by mouth at bedtime as needed for sleep.    aspirin EC 81 MG tablet Take 81 mg by mouth daily.   atorvastatin (LIPITOR) 40 MG tablet Take 40 mg by mouth daily.   diphenhydrAMINE (BENADRYL ALLERGY) 25 mg capsule Take 1 capsule (25 mg total) by mouth every 4 (four) hours as needed for itching (Take if hydrocodone causes hives or itching).   lisinopril (ZESTRIL) 5 MG tablet Take 5 mg by mouth daily.   meloxicam (MOBIC) 7.5 MG tablet TAKE 1 TABLET BY MOUTH TWICE A DAY   Multiple Vitamin (MULTIVITAMIN) capsule Take 1 capsule by mouth daily.   OZEMPIC, 0.25 OR 0.5 MG/DOSE, 2 MG/3ML SOPN 0.25 mg once a week.   [DISCONTINUED] HYDROcodone-acetaminophen (NORCO) 5-325 MG tablet Take 1 tablet by mouth every 6 (six) hours as needed for moderate pain.   [DISCONTINUED] pyridoxine (B-6) 100 MG tablet Take 200 mg by mouth daily.   No facility-administered encounter medications on file as of 03/02/2023.    Past Medical History:  Diagnosis Date   Anxiety    panic attack   Arthritis    Carpal tunnel  syndrome on both sides    Complication of anesthesia    1st knee replacement slow to awaken, 2nd knee replacement - woke up had to have more anesthesia   High cholesterol    History of kidney stones    Hypertension    Knee joint replacement status    Shortness of breath dyspnea    with exertion    Past Surgical History:  Procedure Laterality Date   APPENDECTOMY     BREAST CYST EXCISION     BUNIONECTOMY  09/29/2011   RIGHT   CARPAL TUNNEL RELEASE  04/13/2012   rocedure: CARPAL TUNNEL RELEASE;  Surgeon: Vickki Hearing, MD;  Location: [facility];  Service: Orthopedics;  Laterality: Right;   CARPAL TUNNEL RELEASE Left 11/26/2021   Procedure: CARPAL TUNNEL RELEASE;  Surgeon: Vickki Hearing, MD;  Location: [facility];  Service: Orthopedics;  Laterality: Left;   COLONOSCOPY N/A 09/11/2015   Procedure: COLONOSCOPY;  Surgeon: West Bali, MD;  Location: [facility];  Service: Endoscopy;  Laterality: N/A;  1000   hip repalcement Left 2021   JOINT REPLACEMENT Bilateral 07/25/2006   bilateral knees, [facility]; [place]   KNEE SURGERY     LAMINECTOMY     ORIF HUMERUS FRACTURE Right 01/11/2016   Procedure: OPEN REDUCTION INTERNAL FIXATION (ORIF) RIGHT PROXIMAL HUMERUS FRACTURE;  Surgeon: [name], MD;  Location: [facility];  Service: Orthopedics;  Laterality: Right;  Family History  Problem Relation Age of Onset   Dementia Mother    Arthritis Mother    Hypertension Mother    Colon polyps Mother    Cancer Father        prostate, lung   COPD Father    Heart disease Father    Colon polyps Father    Congestive Heart Failure Father    Hyperlipidemia Sister    Colon polyps Sister    Hyperlipidemia Brother    Colon polyps Brother    Birth defects Son    Early death Son    Congestive Heart Failure Paternal Aunt    Cancer Maternal Grandmother        lung   Diabetes Maternal Grandfather    Heart attack Maternal Grandfather    Heart disease Paternal Grandmother    Stroke Paternal  Grandmother    Congestive Heart Failure Paternal Grandmother    Cancer Paternal Grandfather        lung   Cancer Brother        liver   Diabetes Brother    Irritable bowel syndrome Daughter    Congestive Heart Failure Paternal Aunt    Anesthesia problems Neg Hx    Hypotension Neg Hx    Malignant hyperthermia Neg Hx    Pseudochol deficiency Neg Hx     Social History   Socioeconomic History   Marital status: Married    Spouse name: Not on file   Number of children: Not on file   Years of education: Not on file   Highest education level: Not on file  Occupational History   Not on file  Tobacco Use   Smoking status: Former    Current packs/day: 0.00    Types: Cigarettes    Start date: 11/25/1980    Quit date: 11/26/2010    Years since quitting: 12.2   Smokeless tobacco: Never  Substance and Sexual Activity   Alcohol use: Yes    Alcohol/week: 2.0 standard drinks of alcohol    Types: 2 Glasses of wine per week    Comment: occasional wine   Drug use: No   Sexual activity: Not Currently    Birth control/protection: Post-menopausal  Other Topics Concern   Not on file  Social History Narrative   RAISED KID. THEN WORKED FOR 12 YRS. TROUBLE WITH HIPS AND KNEES TOOK HER OUT OF WORK DAUGHTER WORKS FOR [employer]. ANOTHER DAUGHTER STUDYING TO BE A NURSE.   Social Determinants of Health   Financial Resource Strain: Not on file  Food Insecurity: Not on file  Transportation Needs: Not on file  Physical Activity: Not on file  Stress: Not on file  Social Connections: Not on file  Intimate Partner Violence: Not on file   Review of Systems  Constitutional:  Negative for chills and fever.  HENT:  Negative for sore throat.   Respiratory:  Negative for cough and shortness of breath.   Cardiovascular:  Negative for chest pain, palpitations and leg swelling.  Gastrointestinal:  Negative for abdominal pain, blood in stool, constipation, diarrhea, nausea and vomiting.  Genitourinary:   Negative for dysuria and hematuria.  Musculoskeletal:  Negative for myalgias.  Skin:  Negative for itching and rash.  Neurological:  Negative for dizziness and headaches.  Psychiatric/Behavioral:  Negative for depression and suicidal ideas.    Objective    BP 118/68   Pulse 91   Ht 5\' 4"  (1.626 m)   Wt 149 lb 6.4 oz (67.8 kg)  SpO2 96%   BMI 25.64 kg/m   Physical Exam Vitals reviewed.  Constitutional:      General: She is not in acute distress.    Appearance: Normal appearance. She is not toxic-appearing.  HENT:     Head: Normocephalic and atraumatic.     Right Ear: External ear normal.     Left Ear: External ear normal.     Nose: Nose normal. No congestion or rhinorrhea.     Mouth/Throat:     Mouth: Mucous membranes are moist.     Pharynx: Oropharynx is clear. No oropharyngeal exudate or posterior oropharyngeal erythema.  Eyes:     General: No scleral icterus.    Extraocular Movements: Extraocular movements intact.     Conjunctiva/sclera: Conjunctivae normal.     Pupils: Pupils are equal, round, and reactive to light.  Cardiovascular:     Rate and Rhythm: Normal rate and regular rhythm.     Pulses: Normal pulses.     Heart sounds: Normal heart sounds. No murmur heard.    No friction rub. No gallop.  Pulmonary:     Effort: Pulmonary effort is normal.     Breath sounds: Normal breath sounds. No wheezing, rhonchi or rales.  Abdominal:     General: Abdomen is flat. Bowel sounds are normal. There is no distension.     Palpations: Abdomen is soft.     Tenderness: There is no abdominal tenderness.  Musculoskeletal:        General: No swelling. Normal range of motion.     Cervical back: Normal range of motion.     Right lower leg: No edema.     Left lower leg: No edema.  Lymphadenopathy:     Cervical: No cervical adenopathy.  Skin:    General: Skin is warm and dry.     Capillary Refill: Capillary refill takes less than 2 seconds.     Coloration: Skin is not  jaundiced.  Neurological:     General: No focal deficit present.     Mental Status: She is alert and oriented to person, place, and time.  Psychiatric:        Mood and Affect: Mood normal.        Behavior: Behavior normal.    Assessment & Plan:   Problem List Items Addressed This Visit       Essential hypertension    She endorses a history of essential hypertension that is managed with lisinopril 5 mg daily.  BP today is 118/68.  No medication changes are indicated.      Degenerative arthritis of hip    Severe right hip OA noted on MRI obtained in June.  She is followed by orthopedic surgery.  Previously underwent left hip arthroplasty with Dr. Linna Caprice of [facility] in 2021. History of bilateral knee arthroplasty with Dr. Romeo Apple in 2008.  Most recently, she has discussed intra-articular corticosteroid injection of the right hip with her PCP.  Denies pain currently and is using meloxicam as needed.  I have recommended that she follow-up with orthopedic surgery to continue discussions regarding steroid injection if pain worsens.      Hyperlipidemia    No recent lipid panel on file for review.  She is currently prescribed atorvastatin 40 mg daily.  States that her labs were updated by her PCP recently and her cholesterol was well-controlled. -No medication changes today.  Repeat lipid panel at follow-up in 6 months.      Generalized anxiety disorder    She is  currently prescribed alprazolam 2 mg nightly and 1 mg every morning as needed.  PDMP reviewed and is appropriate. -No medication changes have been made today.  Controlled substance agreement signed.  UDS pending.      Prediabetes    She endorses a history of prediabetes and is currently prescribed Ozempic 0.25 mg weekly.  She reports that she has lost significant weight while on Ozempic and was previously on a higher dose.  She tried to stop Ozempic for 3 months, but her numbers worsen.  She remains on 0.25 mg weekly  essentially for maintenance. -Will request recent labs from her previous PCP.  Plan on repeat labs in 6 months.      Return in about 6 months (around 09/02/2023).   Billie Lade, MD

## 2023-03-02 NOTE — Patient Instructions (Addendum)
It was a pleasure to see you today.  Thank you for giving [place] the opportunity to be involved in your care.  Below is a brief recap of your visit and next steps.  We will plan to see you again in 6 months.  Summary You have established care today No medication changes have been made We will follow up in 6 months    Schedule your Medicare Annual Wellness Visit at checkout.

## 2023-03-02 NOTE — Assessment & Plan Note (Signed)
She endorses a history of prediabetes and is currently prescribed Ozempic 0.25 mg weekly.  She reports that she has lost significant weight while on Ozempic and was previously on a higher dose.  She tried to stop Ozempic for 3 months, but her numbers worsen.  She remains on 0.25 mg weekly essentially for maintenance. -Will request recent labs from her previous PCP.  Plan on repeat labs in 6 months.

## 2023-03-02 NOTE — Assessment & Plan Note (Signed)
She endorses a history of essential hypertension that is managed with lisinopril 5 mg daily.  BP today is 118/68.  No medication changes are indicated.

## 2023-05-26 ENCOUNTER — Other Ambulatory Visit: Payer: Self-pay | Admitting: Internal Medicine

## 2023-05-26 DIAGNOSIS — F419 Anxiety disorder, unspecified: Secondary | ICD-10-CM

## 2023-06-07 ENCOUNTER — Ambulatory Visit: Payer: Medicare Other

## 2023-06-07 VITALS — Ht 64.0 in | Wt 145.0 lb

## 2023-06-07 DIAGNOSIS — Z0000 Encounter for general adult medical examination without abnormal findings: Secondary | ICD-10-CM | POA: Diagnosis not present

## 2023-06-07 NOTE — Progress Notes (Signed)
Subjective:   [name] is a 72 y.o. female who presents for Medicare Annual (Subsequent) preventive examination.  Visit Complete: Virtual I connected with  [name] on 06/07/23 by a audio enabled telemedicine application and verified that I am speaking with the correct person using two identifiers.  Patient Location: Home  Provider Location: Home Office  I discussed the limitations of evaluation and management by telemedicine. The patient expressed understanding and agreed to proceed.  Vital Signs: Because this visit was a virtual/telehealth visit, some criteria may be missing or patient reported. Any vitals not documented were not able to be obtained and vitals that have been documented are patient reported.  Patient Medicare AWV questionnaire was completed by the patient on 06/07/2023; I have confirmed that all information answered by patient is correct and no changes since this date.  Cardiac Risk Factors include: advanced age (>51men, >31 women);diabetes mellitus;dyslipidemia;hypertension     Objective:    Today's Vitals   06/07/23 1002  Weight: 145 lb (65.8 kg)  Height: 5\' 4"  (1.626 m)   Body mass index is 24.89 kg/m.     06/07/2023   10:10 AM 11/24/2021   10:22 AM 04/19/2017   10:26 AM 04/19/2016   10:30 AM 03/30/2016   11:09 AM 03/23/2016   10:31 AM 01/12/2016   12:08 AM  Advanced Directives  Does Patient Have a Medical Advance Directive? No No No No No No No  Would patient like information on creating a medical advance directive? Yes (MAU/Ambulatory/Procedural Areas - Information given) No - Patient declined Yes (MAU/Ambulatory/Procedural Areas - Information given) No - patient declined information No - patient declined information No - patient declined information Yes - Educational materials given    Current Medications (verified) Outpatient Encounter Medications as of 06/07/2023  Medication Sig   ALPRAZolam (XANAX) 1 MG tablet TAKE ONE TABLET BY MOUTH  IN THE MORNING AND 2 TABLETS DAILY AT BEDTIME FOR ANXIETY (DUE 11/25/21)   aspirin EC 81 MG tablet Take 81 mg by mouth daily.   atorvastatin (LIPITOR) 40 MG tablet Take 40 mg by mouth daily.   diphenhydrAMINE (BENADRYL ALLERGY) 25 mg capsule Take 1 capsule (25 mg total) by mouth every 4 (four) hours as needed for itching (Take if hydrocodone causes hives or itching).   lisinopril (ZESTRIL) 5 MG tablet Take 5 mg by mouth daily.   meloxicam (MOBIC) 7.5 MG tablet TAKE 1 TABLET BY MOUTH TWICE A DAY   Multiple Vitamin (MULTIVITAMIN) capsule Take 1 capsule by mouth daily.   OZEMPIC, 0.25 OR 0.5 MG/DOSE, 2 MG/3ML SOPN 0.25 mg once a week.   No facility-administered encounter medications on file as of 06/07/2023.    Allergies (verified) Gabapentin and Morphine   History: Past Medical History:  Diagnosis Date   Anxiety    panic attack   Arthritis    Carpal tunnel syndrome on both sides    Complication of anesthesia    1st knee replacement slow to awaken, 2nd knee replacement - woke up had to have more anesthesia   High cholesterol    History of kidney stones    Hypertension    Knee joint replacement status    Shortness of breath dyspnea    with exertion   Past Surgical History:  Procedure Laterality Date   APPENDECTOMY     BREAST CYST EXCISION     BUNIONECTOMY  09/29/2011   RIGHT   CARPAL TUNNEL RELEASE  04/13/2012   rocedure: CARPAL TUNNEL RELEASE;  Surgeon:  Vickki Hearing, MD;  Location: [facility];  Service: Orthopedics;  Laterality: Right;   CARPAL TUNNEL RELEASE Left 11/26/2021   Procedure: CARPAL TUNNEL RELEASE;  Surgeon: Vickki Hearing, MD;  Location: [facility];  Service: Orthopedics;  Laterality: Left;   COLONOSCOPY N/A 09/11/2015   Procedure: COLONOSCOPY;  Surgeon: West Bali, MD;  Location: [facility];  Service: Endoscopy;  Laterality: N/A;  1000   hip repalcement Left 2021   JOINT REPLACEMENT Bilateral 07/25/2006   bilateral knees, [facility]; [facility]   KNEE SURGERY      LAMINECTOMY     ORIF HUMERUS FRACTURE Right 01/11/2016   Procedure: OPEN REDUCTION INTERNAL FIXATION (ORIF) RIGHT PROXIMAL HUMERUS FRACTURE;  Surgeon: [name], MD;  Location: [facility];  Service: Orthopedics;  Laterality: Right;   Family History  Problem Relation Age of Onset   Dementia Mother    Arthritis Mother    Hypertension Mother    Colon polyps Mother    Cancer Father        prostate, lung   COPD Father    Heart disease Father    Colon polyps Father    Congestive Heart Failure Father    Hyperlipidemia Sister    Colon polyps Sister    Hyperlipidemia Brother    Colon polyps Brother    Birth defects Son    Early death Son    Congestive Heart Failure Paternal Aunt    Cancer Maternal Grandmother        lung   Diabetes Maternal Grandfather    Heart attack Maternal Grandfather    Heart disease Paternal Grandmother    Stroke Paternal Grandmother    Congestive Heart Failure Paternal Grandmother    Cancer Paternal Grandfather        lung   Cancer Brother        liver   Diabetes Brother    Irritable bowel syndrome Daughter    Congestive Heart Failure Paternal Aunt    Anesthesia problems Neg Hx    Hypotension Neg Hx    Malignant hyperthermia Neg Hx    Pseudochol deficiency Neg Hx    Social History   Socioeconomic History   Marital status: Married    Spouse name: Not on file   Number of children: Not on file   Years of education: Not on file   Highest education level: Not on file  Occupational History   Not on file  Tobacco Use   Smoking status: Former    Current packs/day: 0.00    Types: Cigarettes    Start date: 11/25/1980    Quit date: 11/26/2010    Years since quitting: 12.5   Smokeless tobacco: Never  Substance and Sexual Activity   Alcohol use: Yes    Alcohol/week: 2.0 standard drinks of alcohol    Types: 2 Glasses of wine per week    Comment: occasional wine   Drug use: No   Sexual activity: Not Currently    Birth control/protection:  Post-menopausal  Other Topics Concern   Not on file  Social History Narrative   RAISED KID. THEN WORKED FOR 12 YRS. TROUBLE WITH HIPS AND KNEES TOOK HER OUT OF WORK DAUGHTER WORKS FOR [employer]. ANOTHER DAUGHTER STUDYING TO BE A NURSE.   Social Determinants of Health   Financial Resource Strain: Low Risk  (06/07/2023)   Overall Financial Resource Strain (CARDIA)    Difficulty of Paying Living Expenses: Not hard at all  Food Insecurity: No Food Insecurity (06/07/2023)  Hunger Vital Sign    Worried About Running Out of Food in the Last Year: Never true    Ran Out of Food in the Last Year: Never true  Transportation Needs: No Transportation Needs (06/07/2023)   PRAPARE - Administrator, Civil Service (Medical): No    Lack of Transportation (Non-Medical): No  Physical Activity: Sufficiently Active (06/07/2023)   Exercise Vital Sign    Days of Exercise per Week: 5 days    Minutes of Exercise per Session: 30 min  Stress: No Stress Concern Present (06/07/2023)   [employer] of Occupational Health - Occupational Stress Questionnaire    Feeling of Stress : Not at all  Social Connections: Moderately Integrated (06/07/2023)   Social Connection and Isolation Panel [NHANES]    Frequency of Communication with Friends and Family: More than three times a week    Frequency of Social Gatherings with Friends and Family: More than three times a week    Attends Religious Services: More than 4 times per year    Active Member of [employer] or Organizations: No    Attends Engineer, structural: Never    Marital Status: Married    Tobacco Counseling Counseling given: Not Answered   Clinical Intake:  Pre-visit preparation completed: Yes  Pain : No/denies pain     Nutritional Risks: None Diabetes: Yes CBG done?: No Did pt. bring in CBG monitor from home?: No  How often do you need to have someone help you when you read instructions, pamphlets, or other written  materials from your doctor or pharmacy?: 1 - Never  Interpreter Needed?: No  Information entered by :: Renie Ora, LPN   Activities of Daily Living    06/07/2023   10:10 AM  In your present state of health, do you have any difficulty performing the following activities:  Hearing? 0  Vision? 0  Difficulty concentrating or making decisions? 0  Walking or climbing stairs? 0  Dressing or bathing? 0  Doing errands, shopping? 0  Preparing Food and eating ? N  Using the Toilet? N  In the past six months, have you accidently leaked urine? N  Do you have problems with loss of bowel control? N  Managing your Medications? N  Managing your Finances? N  Housekeeping or managing your Housekeeping? N    Patient Care Team: Billie Lade, MD as PCP - General (Internal Medicine) Runell Gess, MD as PCP - Cardiology (Cardiology) West Bali, MD (Inactive) as Consulting Physician (Gastroenterology) Lanelle Bal, DO as Consulting Physician (Internal Medicine)  Indicate any recent Medical Services you may have received from other than [facility] providers in the past year (date may be approximate).     Assessment:   This is a routine wellness examination for [place].  Hearing/Vision screen Vision Screening - Comments:: Wears rx glasses - up to date with routine eye exams with  Dr.Cotter    Goals Addressed             This Visit's Progress    DIET - EAT MORE FRUITS AND VEGETABLES         Depression Screen    06/07/2023   10:06 AM 03/02/2023   10:29 AM  PHQ 2/9 Scores  PHQ - 2 Score 0 1  PHQ- 9 Score  1    Fall Risk    06/07/2023   10:04 AM 03/02/2023   10:29 AM  Fall Risk   Falls in the past year? 0 0  Number falls in past yr: 0 0  Injury with Fall? 0 0  Risk for fall due to : No Fall Risks No Fall Risks  Follow up Falls prevention discussed Falls evaluation completed    MEDICARE RISK AT HOME: Medicare Risk at Home Any stairs in or around the home?:  No If so, are there any without handrails?: No Home free of loose throw rugs in walkways, pet beds, electrical cords, etc?: Yes Adequate lighting in your home to reduce risk of falls?: Yes Life alert?: No Use of a cane, walker or w/c?: No Grab bars in the bathroom?: Yes Shower chair or bench in shower?: Yes Elevated toilet seat or a handicapped toilet?: Yes  TIMED UP AND GO:  Was the test performed?  No    Cognitive Function:        06/07/2023   10:11 AM  6CIT Screen  What Year? 0 points  What month? 0 points  What time? 0 points  Count back from 20 0 points  Months in reverse 0 points  Repeat phrase 0 points  Total Score 0 points    Immunizations Immunization History  Administered Date(s) Administered   Zoster Recombinant(Shingrix) 09/29/2021, 12/07/2021    TDAP status: Due, Education has been provided regarding the importance of this vaccine. Advised may receive this vaccine at local pharmacy or Health Dept. Aware to provide a copy of the vaccination record if obtained from local pharmacy or Health Dept. Verbalized acceptance and understanding.  Flu Vaccine status: Due, Education has been provided regarding the importance of this vaccine. Advised may receive this vaccine at local pharmacy or Health Dept. Aware to provide a copy of the vaccination record if obtained from local pharmacy or Health Dept. Verbalized acceptance and understanding.  Pneumococcal vaccine status: Due, Education has been provided regarding the importance of this vaccine. Advised may receive this vaccine at local pharmacy or Health Dept. Aware to provide a copy of the vaccination record if obtained from local pharmacy or Health Dept. Verbalized acceptance and understanding.  Covid-19 vaccine status: Completed vaccines  Qualifies for Shingles Vaccine? Yes   Zostavax completed Yes   Shingrix Completed?: Yes  Screening Tests Health Maintenance  Topic Date Due   COVID-19 Vaccine (1) Never done    Hepatitis C Screening  Never done   DTaP/Tdap/Td (1 - Tdap) Never done   Pneumonia Vaccine 29+ Years old (1 of 1 - PCV) Never done   INFLUENZA VACCINE  Never done   Medicare Annual Wellness (AWV)  06/06/2024   MAMMOGRAM  08/03/2024   Colonoscopy  09/10/2025   DEXA SCAN  Completed   Zoster Vaccines- Shingrix  Completed   HPV VACCINES  Aged Out    Health Maintenance  Health Maintenance Due  Topic Date Due   COVID-19 Vaccine (1) Never done   Hepatitis C Screening  Never done   DTaP/Tdap/Td (1 - Tdap) Never done   Pneumonia Vaccine 87+ Years old (1 of 1 - PCV) Never done   INFLUENZA VACCINE  Never done    Colorectal cancer screening: Type of screening: Colonoscopy. Completed 09/11/2015. Repeat every 10 years  Mammogram status: Completed 08/03/2022. Repeat every year  Bone Density status: Completed 08/05/2022. Results reflect: Bone density results: OSTEOPOROSIS. Repeat every 2 years.  Lung Cancer Screening: (Low Dose CT Chest recommended if Age 70-80 years, 20 pack-year currently smoking OR have quit w/in 15years.) does not qualify.   Lung Cancer Screening Referral: n/a  Additional Screening:  Hepatitis C Screening: does qualify;  Vision Screening: Recommended annual ophthalmology exams for early detection of glaucoma and other disorders of the eye. Is the patient up to date with their annual eye exam?  Yes  Who is the provider or what is the name of the office in which the patient attends annual eye exams? Dr.Cotter  If pt is not established with a provider, would they like to be referred to a provider to establish care? No .   Dental Screening: Recommended annual dental exams for proper oral hygiene  Diabetic Foot Exam: Diabetic Foot Exam: Overdue, Pt has been advised about the importance in completing this exam. Pt is scheduled for diabetic foot exam on next office visit .  Community Resource Referral / Chronic Care Management: CRR required this visit?  No   CCM  required this visit?  No     Plan:     I have personally reviewed and noted the following in the patient's chart:   Medical and social history Use of alcohol, tobacco or illicit drugs  Current medications and supplements including opioid prescriptions. Patient is not currently taking opioid prescriptions. Functional ability and status Nutritional status Physical activity Advanced directives List of other physicians Hospitalizations, surgeries, and ER visits in previous 12 months Vitals Screenings to include cognitive, depression, and falls Referrals and appointments  In addition, I have reviewed and discussed with patient certain preventive protocols, quality metrics, and best practice recommendations. A written personalized care plan for preventive services as well as general preventive health recommendations were provided to patient.     Lorrene Reid, LPN   [id number]   After Visit Summary: (MyChart) Due to this being a telephonic visit, the after visit summary with patients personalized plan was offered to patient via MyChart   Nurse Notes:  Patient to obtain vaccine records

## 2023-06-07 NOTE — Patient Instructions (Signed)
Ms. Abare , Thank you for taking time to come for your Medicare Wellness Visit. I appreciate your ongoing commitment to your health goals. Please review the following plan we discussed and let me know if I can assist you in the future.   Referrals/Orders/Follow-Ups/Clinician Recommendations: Aim for 30 minutes of exercise or brisk walking, 6-8 glasses of water, and 5 servings of fruits and vegetables each day.   This is a list of the screening recommended for you and due dates:  Health Maintenance  Topic Date Due   COVID-19 Vaccine (1) Never done   Hepatitis C Screening  Never done   DTaP/Tdap/Td vaccine (1 - Tdap) Never done   Pneumonia Vaccine (1 of 1 - PCV) Never done   Flu Shot  Never done   Medicare Annual Wellness Visit  06/06/2024   Mammogram  08/03/2024   Colon Cancer Screening  09/10/2025   DEXA scan (bone density measurement)  Completed   Zoster (Shingles) Vaccine  Completed   HPV Vaccine  Aged Out    Advanced directives: (Provided) Advance directive discussed with you today. I have provided a copy for you to complete at home and have notarized. Once this is complete, please bring a copy in to our office so we can scan it into your chart. Information on Advanced Care Planning can be found at [facility] Advance Health Care Directives Advance Health Care Directives ([email or web address]    Next Medicare Annual Wellness Visit scheduled for next year: Yes  Insert Preventive Care attachment Insert FALL PREVENTION attachment if needed

## 2023-06-29 ENCOUNTER — Other Ambulatory Visit (HOSPITAL_COMMUNITY): Payer: Self-pay | Admitting: Adult Health

## 2023-06-29 DIAGNOSIS — Z1231 Encounter for screening mammogram for malignant neoplasm of breast: Secondary | ICD-10-CM

## 2023-07-02 ENCOUNTER — Other Ambulatory Visit: Payer: Self-pay | Admitting: Internal Medicine

## 2023-07-02 DIAGNOSIS — F419 Anxiety disorder, unspecified: Secondary | ICD-10-CM

## 2023-08-02 ENCOUNTER — Other Ambulatory Visit: Payer: Self-pay | Admitting: Internal Medicine

## 2023-08-02 DIAGNOSIS — F419 Anxiety disorder, unspecified: Secondary | ICD-10-CM

## 2023-08-07 ENCOUNTER — Ambulatory Visit (HOSPITAL_COMMUNITY)
Admission: RE | Admit: 2023-08-07 | Discharge: 2023-08-07 | Disposition: A | Discharge status: Home / Self Care | Payer: Medicare Other | Source: Ambulatory Visit | Attending: Adult Health | Admitting: Adult Health

## 2023-08-07 DIAGNOSIS — Z1231 Encounter for screening mammogram for malignant neoplasm of breast: Secondary | ICD-10-CM | POA: Insufficient documentation

## 2023-08-08 ENCOUNTER — Telehealth: Payer: Self-pay | Admitting: *Deleted

## 2023-08-08 NOTE — Telephone Encounter (Signed)
 Left message @ 12:33 pm, letting pt know mammogram was negative and needs repeating in 1 year. JSY

## 2023-08-08 NOTE — Telephone Encounter (Signed)
-----   Message from Cyril Mourning sent at 08/08/2023  8:38 AM EST ----- Let her know about mammogram THX

## 2023-08-17 ENCOUNTER — Other Ambulatory Visit: Payer: Self-pay | Admitting: Internal Medicine

## 2023-08-17 ENCOUNTER — Ambulatory Visit: Payer: Self-pay | Admitting: Internal Medicine

## 2023-08-17 DIAGNOSIS — F419 Anxiety disorder, unspecified: Secondary | ICD-10-CM

## 2023-08-17 NOTE — Telephone Encounter (Signed)
Medication has not been approved to be sent in by dr Durwin Nora, awaiting approval

## 2023-08-17 NOTE — Telephone Encounter (Signed)
Forwarding to provider for PA  Copied from CRM [id number]. Topic: Clinical - Prescription Issue >> Aug 17, 2023  9:24 AM Carlatta H wrote: Reason for CRM: OZEMPIC, 0.25 OR 0.5 MG/DOSE, 2 MG/3ML SOPN [[id number]] needs to be sent for pre authorization with insurance//

## 2023-08-23 ENCOUNTER — Other Ambulatory Visit: Payer: Self-pay | Admitting: Orthopedic Surgery

## 2023-08-23 DIAGNOSIS — M25552 Pain in left hip: Secondary | ICD-10-CM

## 2023-08-23 DIAGNOSIS — M25551 Pain in right hip: Secondary | ICD-10-CM

## 2023-09-01 ENCOUNTER — Other Ambulatory Visit: Payer: Self-pay | Admitting: Internal Medicine

## 2023-09-01 DIAGNOSIS — F419 Anxiety disorder, unspecified: Secondary | ICD-10-CM

## 2023-09-04 ENCOUNTER — Encounter: Payer: Self-pay | Admitting: Internal Medicine

## 2023-09-04 ENCOUNTER — Ambulatory Visit: Payer: Medicare Other | Admitting: Internal Medicine

## 2023-09-04 VITALS — BP 115/69 | HR 93 | Ht 64.0 in | Wt 153.4 lb

## 2023-09-04 DIAGNOSIS — I10 Essential (primary) hypertension: Secondary | ICD-10-CM | POA: Diagnosis not present

## 2023-09-04 DIAGNOSIS — Z1159 Encounter for screening for other viral diseases: Secondary | ICD-10-CM

## 2023-09-04 DIAGNOSIS — E782 Mixed hyperlipidemia: Secondary | ICD-10-CM | POA: Diagnosis not present

## 2023-09-04 DIAGNOSIS — R7303 Prediabetes: Secondary | ICD-10-CM | POA: Diagnosis not present

## 2023-09-04 DIAGNOSIS — E663 Overweight: Secondary | ICD-10-CM

## 2023-09-04 DIAGNOSIS — F411 Generalized anxiety disorder: Secondary | ICD-10-CM | POA: Diagnosis not present

## 2023-09-04 NOTE — Progress Notes (Signed)
 Established Patient Office Visit  Subjective   Patient ID: [name], female    DOB: [birth year]  Age: 73 y.o. MRN: [id number]  Chief Complaint  Patient presents with   Care Management    Six month follow up    [name] returns to care today for routine follow-up.  She was last evaluated by me in August 2024 as a new patient presenting to establish care.  No medication changes were made at that time and 21-month follow-up was arranged.  There have been no acute interval events.  [name] reports feeling fairly well today.  She endorses chronic right hip pain but is otherwise asymptomatic and has no acute concerns to discuss.  Past Medical History:  Diagnosis Date   Anxiety    panic attack   Arthritis    Carpal tunnel syndrome on both sides    Complication of anesthesia    1st knee replacement slow to awaken, 2nd knee replacement - woke up had to have more anesthesia   High cholesterol    History of kidney stones    Hypertension    Knee joint replacement status    Shortness of breath dyspnea    with exertion   Past Surgical History:  Procedure Laterality Date   APPENDECTOMY     BREAST CYST EXCISION     BUNIONECTOMY  09/29/2011   RIGHT   CARPAL TUNNEL RELEASE  04/13/2012   rocedure: CARPAL TUNNEL RELEASE;  Surgeon: Darrin Emerald, MD;  Location: [facility];  Service: Orthopedics;  Laterality: Right;   CARPAL TUNNEL RELEASE Left 11/26/2021   Procedure: CARPAL TUNNEL RELEASE;  Surgeon: Darrin Emerald, MD;  Location: [facility];  Service: Orthopedics;  Laterality: Left;   COLONOSCOPY N/A 09/11/2015   Procedure: COLONOSCOPY;  Surgeon: Alyce Jubilee, MD;  [facility];  Service: Endoscopy;  Laterality: N/A;  1000   hip repalcement Left 2021   JOINT REPLACEMENT Bilateral 07/25/2006   bilateral knees, [facility]; [facility]   KNEE SURGERY     LAMINECTOMY     ORIF HUMERUS FRACTURE Right 01/11/2016   Procedure: OPEN REDUCTION INTERNAL FIXATION (ORIF) RIGHT PROXIMAL  HUMERUS FRACTURE;  Surgeon: Jasmine Mesi, MD;  Location: [facility];  Service: Orthopedics;  Laterality: Right;   Social History   Tobacco Use   Smoking status: Former    Current packs/day: 0.00    Types: Cigarettes    Start date: 11/25/1980    Quit date: 11/26/2010    Years since quitting: 12.7   Smokeless tobacco: Never  Substance Use Topics   Alcohol use: Yes    Alcohol/week: 2.0 standard drinks of alcohol    Types: 2 Glasses of wine per week    Comment: occasional wine   Drug use: No   Family History  Problem Relation Age of Onset   Dementia Mother    Arthritis Mother    Hypertension Mother    Colon polyps Mother    Cancer Father        prostate, lung   COPD Father    Heart disease Father    Colon polyps Father    Congestive Heart Failure Father    Hyperlipidemia Sister    Colon polyps Sister    Hyperlipidemia Brother    Colon polyps Brother    Birth defects Son    Early death Son    Congestive Heart Failure Paternal Aunt    Cancer Maternal Grandmother        lung  Diabetes Maternal Grandfather    Heart attack Maternal Grandfather    Heart disease Paternal Grandmother    Stroke Paternal Grandmother    Congestive Heart Failure Paternal Grandmother    Cancer Paternal Grandfather        lung   Cancer Brother        liver   Diabetes Brother    Irritable bowel syndrome Daughter    Congestive Heart Failure Paternal Aunt    Anesthesia problems Neg Hx    Hypotension Neg Hx    Malignant hyperthermia Neg Hx    Pseudochol deficiency Neg Hx    Allergies  Allergen Reactions   Gabapentin  Rash   Morphine  Itching and Rash   Review of Systems  Constitutional:  Negative for chills and fever.  HENT:  Negative for sore throat.   Respiratory:  Negative for cough and shortness of breath.   Cardiovascular:  Negative for chest pain, palpitations and leg swelling.  Gastrointestinal:  Negative for abdominal pain, blood in stool, constipation, diarrhea, nausea and vomiting.   Genitourinary:  Negative for dysuria and hematuria.  Musculoskeletal:  Positive for joint pain (chronic R hip pain). Negative for myalgias.  Skin:  Negative for itching and rash.  Neurological:  Negative for dizziness and headaches.  Psychiatric/Behavioral:  Negative for depression and suicidal ideas.      Objective:     BP 115/69 (BP Location: Left Arm, Patient Position: Sitting, Cuff Size: Normal)   Pulse 93   Ht 5\' 4"  (1.626 m)   Wt 153 lb 6.4 oz (69.6 kg)   SpO2 97%   BMI 26.33 kg/m  BP Readings from Last 3 Encounters:  09/04/23 115/69  03/02/23 118/68  11/26/21 (!) 150/71   Physical Exam Vitals reviewed.  Constitutional:      General: She is not in acute distress.    Appearance: Normal appearance. She is not toxic-appearing.  HENT:     Head: Normocephalic and atraumatic.     Right Ear: External ear normal.     Left Ear: External ear normal.     Nose: Nose normal. No congestion or rhinorrhea.     Mouth/Throat:     Mouth: Mucous membranes are moist.     Pharynx: Oropharynx is clear. No oropharyngeal exudate or posterior oropharyngeal erythema.  Eyes:     General: No scleral icterus.    Extraocular Movements: Extraocular movements intact.     Conjunctiva/sclera: Conjunctivae normal.     Pupils: Pupils are equal, round, and reactive to light.  Cardiovascular:     Rate and Rhythm: Normal rate and regular rhythm.     Pulses: Normal pulses.     Heart sounds: Normal heart sounds. No murmur heard.    No friction rub. No gallop.  Pulmonary:     Effort: Pulmonary effort is normal.     Breath sounds: Normal breath sounds. No wheezing, rhonchi or rales.  Abdominal:     General: Abdomen is flat. Bowel sounds are normal. There is no distension.     Palpations: Abdomen is soft.     Tenderness: There is no abdominal tenderness.  Musculoskeletal:        General: No swelling. Normal range of motion.     Cervical back: Normal range of motion.     Right lower leg: No edema.      Left lower leg: No edema.  Lymphadenopathy:     Cervical: No cervical adenopathy.  Skin:    General: Skin is warm and dry.  Capillary Refill: Capillary refill takes less than 2 seconds.     Coloration: Skin is not jaundiced.  Neurological:     General: No focal deficit present.     Mental Status: She is alert and oriented to person, place, and time.     Gait: Gait abnormal.  Psychiatric:        Mood and Affect: Mood normal.        Behavior: Behavior normal.   Last CBC Lab Results  Component Value Date   WBC 7.5 11/24/2021   HGB 13.2 11/24/2021   HCT 39.8 11/24/2021   MCV 92.1 11/24/2021   MCH 30.6 11/24/2021   RDW 13.2 11/24/2021   PLT 232 11/24/2021   Last metabolic panel Lab Results  Component Value Date   GLUCOSE 106 (H) 11/24/2021   NA 138 11/24/2021   K 4.2 11/24/2021   CL 104 11/24/2021   CO2 24 11/24/2021   BUN 21 11/24/2021   CREATININE 0.78 11/24/2021   GFRNONAA >60 11/24/2021   CALCIUM 9.8 11/24/2021   ANIONGAP 10 11/24/2021     Assessment & Plan:   Problem List Items Addressed This Visit       Essential hypertension - Primary   Remains adequately controlled on lisinopril 5 mg daily.  No medication changes are indicated today.      Hyperlipidemia   Currently prescribed atorvastatin 40 mg daily.  Repeat lipid panel ordered today.      Generalized anxiety disorder   Anxiety remains adequately controlled with alprazolam  1 mg every morning and 2 mg nightly.  PDMP reviewed and is appropriate.      Prediabetes   Currently prescribed Ozempic 0.5 mg weekly.  Repeat A1c ordered today.      Return in about 6 months (around 03/03/2024).   Tobi Fortes, MD

## 2023-09-04 NOTE — Patient Instructions (Signed)
 It was a pleasure to see you today.  Thank you for giving [place] the opportunity to be involved in your care.  Below is a brief recap of your visit and next steps.  We will plan to see you again in 6 months.  Summary No medication changes today Repeat labs ordered Follow up in 6 months

## 2023-09-04 NOTE — Assessment & Plan Note (Signed)
 Anxiety remains adequately controlled with alprazolam  1 mg every morning and 2 mg nightly.  PDMP reviewed and is appropriate.

## 2023-09-04 NOTE — Assessment & Plan Note (Signed)
 Remains adequately controlled on lisinopril 5 mg daily.  No medication changes are indicated today.

## 2023-09-04 NOTE — Assessment & Plan Note (Signed)
 Currently prescribed atorvastatin 40 mg daily.  Repeat lipid panel ordered today.

## 2023-09-04 NOTE — Assessment & Plan Note (Signed)
 Currently prescribed Ozempic 0.5 mg weekly.  Repeat A1c ordered today.

## 2023-09-05 ENCOUNTER — Telehealth: Payer: Self-pay | Admitting: Internal Medicine

## 2023-09-05 ENCOUNTER — Other Ambulatory Visit: Payer: Self-pay | Admitting: Internal Medicine

## 2023-09-05 DIAGNOSIS — R7989 Other specified abnormal findings of blood chemistry: Secondary | ICD-10-CM

## 2023-09-05 NOTE — Telephone Encounter (Signed)
Spoke to patient

## 2023-09-05 NOTE — Telephone Encounter (Signed)
Patient returning call, asking for a call back.

## 2023-09-06 LAB — CBC WITH DIFFERENTIAL/PLATELET
Basophils Absolute: 0.1 10*3/uL (ref 0.0–0.2)
Basos: 1 %
EOS (ABSOLUTE): 0.2 10*3/uL (ref 0.0–0.4)
Eos: 3 %
Hematocrit: 36.9 % (ref 34.0–46.6)
Hemoglobin: 12.5 g/dL (ref 11.1–15.9)
Immature Grans (Abs): 0 10*3/uL (ref 0.0–0.1)
Immature Granulocytes: 0 %
Lymphocytes Absolute: 1.4 10*3/uL (ref 0.7–3.1)
Lymphs: 26 %
MCH: 31.7 pg (ref 26.6–33.0)
MCHC: 33.9 g/dL (ref 31.5–35.7)
MCV: 94 fL (ref 79–97)
Monocytes Absolute: 0.4 10*3/uL (ref 0.1–0.9)
Monocytes: 7 %
Neutrophils Absolute: 3.3 10*3/uL (ref 1.4–7.0)
Neutrophils: 63 %
Platelets: 235 10*3/uL (ref 150–450)
RBC: 3.94 x10E6/uL (ref 3.77–5.28)
RDW: 11.9 % (ref 11.7–15.4)
WBC: 5.3 10*3/uL (ref 3.4–10.8)

## 2023-09-06 LAB — CMP14+EGFR
ALT: 23 [IU]/L (ref 0–32)
AST: 31 [IU]/L (ref 0–40)
Albumin: 4.4 g/dL (ref 3.9–4.9)
Alkaline Phosphatase: 90 [IU]/L (ref 44–121)
BUN/Creatinine Ratio: 19 (ref 12–28)
BUN: 20 mg/dL (ref 8–27)
Bilirubin Total: 0.5 mg/dL (ref 0.0–1.2)
CO2: 23 mmol/L (ref 20–29)
Calcium: 10.3 mg/dL (ref 8.7–10.3)
Chloride: 105 mmol/L (ref 96–106)
Creatinine, Ser: 1.08 mg/dL — ABNORMAL HIGH (ref 0.57–1.00)
Globulin, Total: 2.2 g/dL (ref 1.5–4.5)
Glucose: 76 mg/dL (ref 70–99)
Potassium: 5.1 mmol/L (ref 3.5–5.2)
Sodium: 142 mmol/L (ref 134–144)
Total Protein: 6.6 g/dL (ref 6.0–8.5)
eGFR: 55 mL/min/{1.73_m2} — ABNORMAL LOW (ref >59)

## 2023-09-06 LAB — B12 AND FOLATE PANEL
Folate: >20 ng/mL (ref >3.0)
Vitamin B-12: 783 pg/mL (ref 232–1245)

## 2023-09-06 LAB — TSH+FREE T4
Free T4: 1.26 ng/dL (ref 0.82–1.77)
TSH: 3.25 u[IU]/mL (ref 0.450–4.500)

## 2023-09-06 LAB — HEMOGLOBIN A1C
Est. average glucose Bld gHb Est-mCnc: 108 mg/dL
Hgb A1c MFr Bld: 5.4 % (ref 4.8–5.6)

## 2023-09-06 LAB — LIPID PANEL
Chol/HDL Ratio: 2.8 {ratio} (ref 0.0–4.4)
Cholesterol, Total: 169 mg/dL (ref 100–199)
HDL: 60 mg/dL (ref >39)
LDL Chol Calc (NIH): 85 mg/dL (ref 0–99)
Triglycerides: 139 mg/dL (ref 0–149)
VLDL Cholesterol Cal: 24 mg/dL (ref 5–40)

## 2023-09-06 LAB — HCV INTERPRETATION

## 2023-09-06 LAB — HCV AB W REFLEX TO QUANT PCR: HCV Ab: NONREACTIVE

## 2023-09-06 LAB — VITAMIN D 25 HYDROXY (VIT D DEFICIENCY, FRACTURES): Vit D, 25-Hydroxy: 56.3 ng/mL (ref 30.0–100.0)

## 2023-10-02 ENCOUNTER — Other Ambulatory Visit: Payer: Self-pay | Admitting: Internal Medicine

## 2023-10-02 DIAGNOSIS — F419 Anxiety disorder, unspecified: Secondary | ICD-10-CM

## 2023-10-03 LAB — BASIC METABOLIC PANEL
BUN/Creatinine Ratio: 19 (ref 12–28)
BUN: 18 mg/dL (ref 8–27)
CO2: 24 mmol/L (ref 20–29)
Calcium: 10 mg/dL (ref 8.7–10.3)
Chloride: 107 mmol/L — ABNORMAL HIGH (ref 96–106)
Creatinine, Ser: 0.95 mg/dL (ref 0.57–1.00)
Glucose: 90 mg/dL (ref 70–99)
Potassium: 4.6 mmol/L (ref 3.5–5.2)
Sodium: 144 mmol/L (ref 134–144)
eGFR: 64 mL/min/{1.73_m2} (ref >59)

## 2023-10-09 ENCOUNTER — Telehealth: Payer: Self-pay | Admitting: Internal Medicine

## 2023-10-09 NOTE — Telephone Encounter (Signed)
 Please advise below    Copied from CRM [id number]. Topic: Appointments - Scheduling Inquiry for Clinic >> Oct 09, 2023  4:42 PM Higinio Roger wrote: Reason for CRM: Patient would like a callback to discuss who her new primary would be when Dr. Durwin Nora leaves.  Callback #: [phone]

## 2023-11-08 ENCOUNTER — Other Ambulatory Visit: Payer: Self-pay | Admitting: Internal Medicine

## 2023-11-08 DIAGNOSIS — F419 Anxiety disorder, unspecified: Secondary | ICD-10-CM

## 2023-11-28 ENCOUNTER — Ambulatory Visit: Admitting: Cardiovascular Disease

## 2023-12-09 ENCOUNTER — Other Ambulatory Visit: Payer: Self-pay | Admitting: Internal Medicine

## 2023-12-09 DIAGNOSIS — F419 Anxiety disorder, unspecified: Secondary | ICD-10-CM

## 2024-01-12 ENCOUNTER — Other Ambulatory Visit: Payer: Self-pay | Admitting: Internal Medicine

## 2024-01-12 DIAGNOSIS — F419 Anxiety disorder, unspecified: Secondary | ICD-10-CM

## 2024-02-16 ENCOUNTER — Other Ambulatory Visit: Payer: Self-pay | Admitting: Orthopedic Surgery

## 2024-02-16 DIAGNOSIS — M25552 Pain in left hip: Secondary | ICD-10-CM

## 2024-02-16 DIAGNOSIS — M25551 Pain in right hip: Secondary | ICD-10-CM

## 2024-02-19 ENCOUNTER — Ambulatory Visit: Attending: Cardiovascular Disease | Admitting: Cardiovascular Disease

## 2024-02-19 ENCOUNTER — Encounter: Payer: Self-pay | Admitting: Cardiovascular Disease

## 2024-02-19 VITALS — BP 114/64 | HR 64 | Ht 64.0 in | Wt 152.4 lb

## 2024-02-19 DIAGNOSIS — I10 Essential (primary) hypertension: Secondary | ICD-10-CM | POA: Diagnosis not present

## 2024-02-19 DIAGNOSIS — E782 Mixed hyperlipidemia: Secondary | ICD-10-CM

## 2024-02-19 NOTE — Assessment & Plan Note (Signed)
 History of essential hypertension with blood pressure measured today at 114/64.  She is on lisinopril.

## 2024-02-19 NOTE — Assessment & Plan Note (Signed)
 History of hyperlipidemia on statin therapy with lipid profile performed 09/04/2023 revealing total cholesterol 169, LDL of 85 and HDL of 60.

## 2024-02-19 NOTE — Patient Instructions (Signed)

## 2024-02-19 NOTE — Progress Notes (Signed)
 02/19/2024 [name]   May 18, 1951  [id number]  Primary Physician Melvenia Manus BRAVO, MD Primary Cardiologist: Dorn JINNY Lesches MD [facility] [place] [place], [place]  HPI:  [name] is a 73 y.o.  moderately overweight married Caucasian female mother of 3 daughters, grandmother of 5 grandchildren who was referred by Dr. Knowlton in [place] for cardiovascular evaluation because of chest pain.  I last saw her in the office 11/20/20. Currently on disability for various orthopedic issues including knee and back issues.  She previously was taking care of by Dr. Maye.  Risk factors include 80 pack years of tobacco abuse having quit 10 years ago, treated hyperlipidemia and family history.  Father did have CAD.  Both her father and mother, Rolan and Arlean Glenn, were patients of mine as well.  She has had a normal cath remotely and stress test as well.  She had an episode of atypical chest pain a month ago the last for several minutes associated with shortness of breath but none since.   She had done well until August 31, 2019 when she developed new onset dyspnea on exertion/shortness of breath and fatigue.  The fatigue is since resolved but she continues to be dyspneic.  She denies chest pain.  She has 2 daughters that are nurses who have measured her oxygen saturation in the mid to low 80s.  She does have a long history tobacco abuse having quit 10 to 12 years ago.   I performed Myoview  stress test 10/16/2019 which was entirely normal as was a 2D echo performed 10/04/2019 with grade 1 diastolic dysfunction.    Since I saw her over 3 years ago she was started on Ozempic and has lost over 70 pounds.  She feels clinically improved.  She denies chest pain or shortness of breath.   Current Meds  Medication Sig   ALPRAZolam  (XANAX ) 1 MG tablet TAKE 1 TABLET BY MOUTH IN THE MORNING AND 2 TABLETS DAILY AT BEDTIME FOR ANXIETY   aspirin  EC 81 MG tablet Take 81 mg by mouth daily.   atorvastatin  (LIPITOR) 40 MG tablet Take 40 mg by mouth daily.   diphenhydrAMINE  (BENADRYL  ALLERGY) 25 mg capsule Take 1 capsule (25 mg total) by mouth every 4 (four) hours as needed for itching (Take if hydrocodone  causes hives or itching).   lisinopril (ZESTRIL) 5 MG tablet Take 5 mg by mouth daily.   meloxicam  (MOBIC ) 7.5 MG tablet TAKE 1 TABLET BY MOUTH TWICE A DAY   Multiple Vitamin (MULTIVITAMIN) capsule Take 1 capsule by mouth daily.   Semaglutide,0.25 or 0.5MG /DOS, (OZEMPIC, 0.25 OR 0.5 MG/DOSE,) 2 MG/3ML SOPN INJECT 0.5 MG EACH WEEK SUBCUTANEOUSLY.     Allergies  Allergen Reactions   Gabapentin  Rash   Morphine  Itching and Rash    Social History   Socioeconomic History   Marital status: Married    Spouse name: Not on file   Number of children: Not on file   Years of education: Not on file   Highest education level: Not on file  Occupational History   Not on file  Tobacco Use   Smoking status: Former    Current packs/day: 0.00    Types: Cigarettes    Start date: 11/25/1980    Quit date: 11/26/2010    Years since quitting: 13.2   Smokeless tobacco: Never  Substance and Sexual Activity   Alcohol use: Yes    Alcohol/week: 2.0 standard drinks of alcohol    Types: 2 Glasses  of wine per week    Comment: occasional wine   Drug use: No   Sexual activity: Not Currently    Birth control/protection: Post-menopausal  Other Topics Concern   Not on file  Social History Narrative   RAISED KID. THEN WORKED FOR 12 YRS. TROUBLE WITH HIPS AND KNEES TOOK HER OUT OF WORK DAUGHTER WORKS FOR [employer]. ANOTHER DAUGHTER STUDYING TO BE A NURSE.   Social Drivers of Corporate investment banker Strain: Low Risk  (06/07/2023)   Overall Financial Resource Strain (CARDIA)    Difficulty of Paying Living Expenses: Not hard at all  Food Insecurity: No Food Insecurity (06/07/2023)   Hunger Vital Sign    Worried About Running Out of Food in the Last Year: Never true    Ran Out of Food in the Last Year:  Never true  Transportation Needs: No Transportation Needs (06/07/2023)   PRAPARE - Administrator, Civil Service (Medical): No    Lack of Transportation (Non-Medical): No  Physical Activity: Sufficiently Active (06/07/2023)   Exercise Vital Sign    Days of Exercise per Week: 5 days    Minutes of Exercise per Session: 30 min  Stress: No Stress Concern Present (06/07/2023)   [employer] of Occupational Health - Occupational Stress Questionnaire    Feeling of Stress : Not at all  Social Connections: Moderately Integrated (06/07/2023)   Social Connection and Isolation Panel    Frequency of Communication with Friends and Family: More than three times a week    Frequency of Social Gatherings with Friends and Family: More than three times a week    Attends Religious Services: More than 4 times per year    Active Member of [employer] or Organizations: No    Attends Banker Meetings: Never    Marital Status: Married  Catering manager Violence: Not At Risk (06/07/2023)   Humiliation, Afraid, Rape, and Kick questionnaire    Fear of Current or Ex-Partner: No    Emotionally Abused: No    Physically Abused: No    Sexually Abused: No     Review of Systems: General: negative for chills, fever, night sweats or weight changes.  Cardiovascular: negative for chest pain, dyspnea on exertion, edema, orthopnea, palpitations, paroxysmal nocturnal dyspnea or shortness of breath Dermatological: negative for rash Respiratory: negative for cough or wheezing Urologic: negative for hematuria Abdominal: negative for nausea, vomiting, diarrhea, bright red blood per rectum, melena, or hematemesis Neurologic: negative for visual changes, syncope, or dizziness All other systems reviewed and are otherwise negative except as noted above.    Blood pressure 114/64, pulse 64, height 5' 4 (1.626 m), weight 152 lb 6.4 oz (69.1 kg), SpO2 95%.  General appearance: alert and no distress Neck:  no adenopathy, no carotid bruit, no JVD, supple, symmetrical, trachea midline, and thyroid not enlarged, symmetric, no tenderness/mass/nodules Lungs: clear to auscultation bilaterally Heart: regular rate and rhythm, S1, S2 normal, no murmur, click, rub or gallop Extremities: extremities normal, atraumatic, no cyanosis or edema Pulses: 2+ and symmetric Skin: Skin color, texture, turgor normal. No rashes or lesions Neurologic: Grossly normal  EKG EKG Interpretation Date/Time:  Monday February 19 2024 10:44:53 EDT Ventricular Rate:  64 PR Interval:  166 QRS Duration:  78 QT Interval:  360 QTC Calculation: 371 R Axis:   37  Text Interpretation: Normal sinus rhythm Normal ECG When compared with ECG of 24-Nov-2021 10:08, No significant change was found Confirmed by Court Carrier [phone]) on 02/19/2024 10:58:37  AM    ASSESSMENT AND PLAN:   Hyperlipidemia History of hyperlipidemia on statin therapy with lipid profile performed 09/04/2023 revealing total cholesterol 169, LDL of 85 and HDL of 60.  Essential hypertension History of essential hypertension with blood pressure measured today at 114/64.  She is on lisinopril.     Dorn DOROTHA Lesches MD FACP,FACC,FAHA, [facility] 02/19/2024 11:04 AM

## 2024-03-05 ENCOUNTER — Encounter (INDEPENDENT_AMBULATORY_CARE_PROVIDER_SITE_OTHER): Payer: Self-pay | Admitting: *Deleted

## 2024-03-05 ENCOUNTER — Encounter: Payer: Self-pay | Admitting: Internal Medicine

## 2024-03-05 ENCOUNTER — Ambulatory Visit: Payer: Medicare Other | Admitting: Internal Medicine

## 2024-03-05 ENCOUNTER — Ambulatory Visit: Admitting: Internal Medicine

## 2024-03-05 VITALS — BP 136/82 | HR 81 | Ht 64.0 in | Wt 154.6 lb

## 2024-03-05 DIAGNOSIS — R7303 Prediabetes: Secondary | ICD-10-CM | POA: Diagnosis not present

## 2024-03-05 DIAGNOSIS — Z800 Family history of malignant neoplasm of digestive organs: Secondary | ICD-10-CM

## 2024-03-05 DIAGNOSIS — E782 Mixed hyperlipidemia: Secondary | ICD-10-CM | POA: Diagnosis not present

## 2024-03-05 DIAGNOSIS — F411 Generalized anxiety disorder: Secondary | ICD-10-CM

## 2024-03-05 DIAGNOSIS — I10 Essential (primary) hypertension: Secondary | ICD-10-CM | POA: Diagnosis not present

## 2024-03-05 DIAGNOSIS — M1611 Unilateral primary osteoarthritis, right hip: Secondary | ICD-10-CM

## 2024-03-05 MED ORDER — ATORVASTATIN CALCIUM 40 MG PO TABS: 40.0000 mg | ORAL_TABLET | Freq: Every day | ORAL | 3 refills | Status: DC

## 2024-03-05 MED ORDER — LISINOPRIL 5 MG PO TABS: 5.0000 mg | ORAL_TABLET | Freq: Every day | ORAL | 3 refills | Status: DC

## 2024-03-05 NOTE — Assessment & Plan Note (Addendum)
 On atorvastatin  40 mg once daily Lipid profile reviewed

## 2024-03-05 NOTE — Assessment & Plan Note (Signed)
 BP Readings from Last 1 Encounters:  03/05/24 136/82   Well-controlled with lisinopril  5 mg QD Counseled for compliance with the medications Advised DASH diet and moderate exercise/walking as tolerated

## 2024-03-05 NOTE — Assessment & Plan Note (Addendum)
 Lab Results  Component Value Date   HGBA1C 5.4 09/04/2023   Advised to continue to follow DASH diet On Ozempic 0.5 mg qw

## 2024-03-05 NOTE — Assessment & Plan Note (Signed)
 Anxiety remains adequately controlled with alprazolam  1 mg every morning and 2 mg nightly.  PDMP reviewed and is appropriate.

## 2024-03-05 NOTE — Patient Instructions (Addendum)
 Please continue to take medications as prescribed.  Please continue to follow DASH diet and perform moderate exercise/walking as tolerated.

## 2024-03-05 NOTE — Progress Notes (Signed)
 Established Patient Office Visit  Subjective:  Patient ID: [name], female    DOB: 06/20/1951  Age: 73 y.o. MRN: [id number]  CC:  Chief Complaint  Patient presents with   Medical Management of Chronic Issues    6 month f/u     HPI [name] is a 73 y.o. female with past medical history of HTN, prediabetes, HLD and GAD who presents for f/u of her chronic medical conditions.  HTN: BP is well-controlled. Takes medications regularly. Patient denies headache, dizziness, chest pain, dyspnea or palpitations.  She takes atorvastatin  for HLD.  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  She takes meloxicam  7.5 mg BID for right hip pain, followed by orthopedic surgeon.  GAD: She takes Xanax  1 mg every morning and 2 mg every afternoon for anxiety, has taken Xanax  for many years.  Denies anhedonia, SI or HI currently.  Prediabetes: She has tolerated Ozempic well. She has been able to lose about 70 lbs since 2023.  She follows low-carb diet.   Past Medical History:  Diagnosis Date   Anxiety    panic attack   Arthritis    Carpal tunnel syndrome on both sides    Complication of anesthesia    1st knee replacement slow to awaken, 2nd knee replacement - woke up had to have more anesthesia   High cholesterol    History of kidney stones    Hypertension    Knee joint replacement status    Shortness of breath dyspnea    with exertion    Past Surgical History:  Procedure Laterality Date   APPENDECTOMY     BREAST CYST EXCISION     BUNIONECTOMY  09/29/2011   RIGHT   CARPAL TUNNEL RELEASE  04/13/2012   rocedure: CARPAL TUNNEL RELEASE;  Surgeon: Taft FORBES Minerva, MD;  Location: [facility];  Service: Orthopedics;  Laterality: Right;   CARPAL TUNNEL RELEASE Left 11/26/2021   Procedure: CARPAL TUNNEL RELEASE;  Surgeon: Minerva Taft FORBES, MD;  Location: [facility];  Service: Orthopedics;  Laterality: Left;   COLONOSCOPY N/A 09/11/2015   Procedure: COLONOSCOPY;  Surgeon: Margo LITTIE Haddock, MD;  Location: [facility];  Service: Endoscopy;  Laterality: N/A;  1000   hip repalcement Left 2021   JOINT REPLACEMENT Bilateral 07/25/2006   bilateral knees, [facility]; [place]   KNEE SURGERY     LAMINECTOMY     ORIF HUMERUS FRACTURE Right 01/11/2016   Procedure: OPEN REDUCTION INTERNAL FIXATION (ORIF) RIGHT PROXIMAL HUMERUS FRACTURE;  Surgeon: Glendia Cordella Hutchinson, MD;  Location: [facility];  Service: Orthopedics;  Laterality: Right;    Family History  Problem Relation Age of Onset   Dementia Mother    Arthritis Mother    Hypertension Mother    Colon polyps Mother    Cancer Father        prostate, lung   COPD Father    Heart disease Father    Colon polyps Father    Congestive Heart Failure Father    Hyperlipidemia Sister    Colon polyps Sister    Hyperlipidemia Brother    Colon polyps Brother    Birth defects Son    Early death Son    Congestive Heart Failure Paternal Aunt    Cancer Maternal Grandmother        lung   Diabetes Maternal Grandfather    Heart attack Maternal Grandfather    Heart disease Paternal Grandmother    Stroke Paternal Grandmother  Congestive Heart Failure Paternal Grandmother    Cancer Paternal Grandfather        lung   Cancer Brother        liver   Diabetes Brother    Irritable bowel syndrome Daughter    Congestive Heart Failure Paternal Aunt    Anesthesia problems Neg Hx    Hypotension Neg Hx    Malignant hyperthermia Neg Hx    Pseudochol deficiency Neg Hx     Social History   Socioeconomic History   Marital status: Married    Spouse name: Not on file   Number of children: Not on file   Years of education: Not on file   Highest education level: Not on file  Occupational History   Not on file  Tobacco Use   Smoking status: Former    Current packs/day: 0.00    Types: Cigarettes    Start date: 11/25/1980    Quit date: 11/26/2010    Years since quitting: 13.2   Smokeless tobacco: Never  Substance and Sexual Activity   Alcohol use:  Yes    Alcohol/week: 2.0 standard drinks of alcohol    Types: 2 Glasses of wine per week    Comment: occasional wine   Drug use: No   Sexual activity: Not Currently    Birth control/protection: Post-menopausal  Other Topics Concern   Not on file  Social History Narrative   RAISED KID. THEN WORKED FOR 12 YRS. TROUBLE WITH HIPS AND KNEES TOOK HER OUT OF WORK DAUGHTER WORKS FOR [employer]. ANOTHER DAUGHTER STUDYING TO BE A NURSE.   Social Drivers of Corporate investment banker Strain: Low Risk  (06/07/2023)   Overall Financial Resource Strain (CARDIA)    Difficulty of Paying Living Expenses: Not hard at all  Food Insecurity: No Food Insecurity (06/07/2023)   Hunger Vital Sign    Worried About Running Out of Food in the Last Year: Never true    Ran Out of Food in the Last Year: Never true  Transportation Needs: No Transportation Needs (06/07/2023)   PRAPARE - Administrator, Civil Service (Medical): No    Lack of Transportation (Non-Medical): No  Physical Activity: Sufficiently Active (06/07/2023)   Exercise Vital Sign    Days of Exercise per Week: 5 days    Minutes of Exercise per Session: 30 min  Stress: No Stress Concern Present (06/07/2023)   [name] of Occupational Health - Occupational Stress Questionnaire    Feeling of Stress : Not at all  Social Connections: Moderately Integrated (06/07/2023)   Social Connection and Isolation Panel    Frequency of Communication with Friends and Family: More than three times a week    Frequency of Social Gatherings with Friends and Family: More than three times a week    Attends Religious Services: More than 4 times per year    Active Member of [employer] or Organizations: No    Attends Banker Meetings: Never    Marital Status: Married  Catering manager Violence: Not At Risk (06/07/2023)   Humiliation, Afraid, Rape, and Kick questionnaire    Fear of Current or Ex-Partner: No    Emotionally Abused: No     Physically Abused: No    Sexually Abused: No    Outpatient Medications Prior to Visit  Medication Sig Dispense Refill   ALPRAZolam  (XANAX ) 1 MG tablet TAKE 1 TABLET BY MOUTH IN THE MORNING AND 2 TABLETS DAILY AT BEDTIME FOR ANXIETY 90 tablet 1  aspirin  EC 81 MG tablet Take 81 mg by mouth daily.     diphenhydrAMINE  (BENADRYL  ALLERGY) 25 mg capsule Take 1 capsule (25 mg total) by mouth every 4 (four) hours as needed for itching (Take if hydrocodone  causes hives or itching). 30 capsule 0   meloxicam  (MOBIC ) 7.5 MG tablet TAKE 1 TABLET BY MOUTH TWICE A DAY 60 tablet 5   Multiple Vitamin (MULTIVITAMIN) capsule Take 1 capsule by mouth daily.     Semaglutide,0.25 or 0.5MG /DOS, (OZEMPIC, 0.25 OR 0.5 MG/DOSE,) 2 MG/3ML SOPN INJECT 0.5 MG EACH WEEK SUBCUTANEOUSLY. 3 mL 2   atorvastatin  (LIPITOR) 40 MG tablet Take 40 mg by mouth daily.     lisinopril  (ZESTRIL ) 5 MG tablet Take 5 mg by mouth daily.     No facility-administered medications prior to visit.    Allergies  Allergen Reactions   Gabapentin  Rash   Morphine  Itching and Rash    ROS Review of Systems  Constitutional:  Negative for chills and fever.  HENT:  Negative for congestion, sinus pressure, sinus pain and sore throat.   Eyes:  Negative for pain and discharge.  Respiratory:  Negative for cough and shortness of breath.   Cardiovascular:  Negative for chest pain and palpitations.  Gastrointestinal:  Negative for abdominal pain, diarrhea, nausea and vomiting.  Endocrine: Negative for polydipsia and polyuria.  Genitourinary:  Negative for dysuria and hematuria.  Musculoskeletal:  Positive for arthralgias. Negative for neck pain and neck stiffness.  Skin:  Negative for rash.  Neurological:  Negative for dizziness and weakness.  Psychiatric/Behavioral:  Negative for agitation and behavioral problems. The patient is nervous/anxious.       Objective:    Physical Exam Vitals reviewed.  Constitutional:      General: She is not in  acute distress.    Appearance: She is not diaphoretic.  HENT:     Head: Normocephalic and atraumatic.     Nose: Nose normal. No congestion.     Mouth/Throat:     Mouth: Mucous membranes are moist.     Pharynx: No posterior oropharyngeal erythema.  Eyes:     General: No scleral icterus.    Extraocular Movements: Extraocular movements intact.  Cardiovascular:     Rate and Rhythm: Normal rate and regular rhythm.     Heart sounds: Normal heart sounds. No murmur heard. Pulmonary:     Breath sounds: Normal breath sounds. No wheezing or rales.  Musculoskeletal:     Cervical back: Neck supple. No tenderness.     Right lower leg: No edema.     Left lower leg: No edema.  Skin:    General: Skin is warm.     Findings: No rash.  Neurological:     General: No focal deficit present.     Mental Status: She is alert and oriented to person, place, and time.  Psychiatric:        Mood and Affect: Mood normal.        Behavior: Behavior normal.     BP 136/82   Pulse 81   Ht 5' 4 (1.626 m)   Wt 154 lb 9.6 oz (70.1 kg)   SpO2 98%   BMI 26.54 kg/m  Wt Readings from Last 3 Encounters:  03/05/24 154 lb 9.6 oz (70.1 kg)  02/19/24 152 lb 6.4 oz (69.1 kg)  09/04/23 153 lb 6.4 oz (69.6 kg)    Lab Results  Component Value Date   TSH 3.250 09/04/2023   Lab Results  Component Value  Date   WBC 5.3 09/04/2023   HGB 12.5 09/04/2023   HCT 36.9 09/04/2023   MCV 94 09/04/2023   PLT 235 09/04/2023   Lab Results  Component Value Date   NA 141 03/05/2024   K 4.8 03/05/2024   CO2 22 03/05/2024   GLUCOSE 86 03/05/2024   BUN 15 03/05/2024   CREATININE 0.99 03/05/2024   BILITOT 0.5 09/04/2023   ALKPHOS 90 09/04/2023   AST 31 09/04/2023   ALT 23 09/04/2023   PROT 6.6 09/04/2023   ALBUMIN 4.4 09/04/2023   CALCIUM  9.8 03/05/2024   ANIONGAP 10 11/24/2021   EGFR 61 03/05/2024   Lab Results  Component Value Date   CHOL 169 09/04/2023   Lab Results  Component Value Date   HDL 60  09/04/2023   Lab Results  Component Value Date   LDLCALC 85 09/04/2023   Lab Results  Component Value Date   TRIG 139 09/04/2023   Lab Results  Component Value Date   CHOLHDL 2.8 09/04/2023   Lab Results  Component Value Date   HGBA1C 5.3 03/05/2024      Assessment & Plan:   Problem List Items Addressed This Visit       Cardiovascular and Mediastinum   Essential hypertension - Primary   BP Readings from Last 1 Encounters:  03/05/24 136/82   Well-controlled with lisinopril  5 mg QD Counseled for compliance with the medications Advised DASH diet and moderate exercise/walking as tolerated       Relevant Medications   lisinopril  (ZESTRIL ) 5 MG tablet   atorvastatin  (LIPITOR) 40 MG tablet   Other Relevant Orders   Basic Metabolic Panel (BMET) (Completed)     Musculoskeletal and Integument   Degenerative arthritis of hip   Has severe right hip OA, followed by orthopedic [facility] at [facility] Has had left hip arthroplasty in the past On meloxicam  7.5 mg BID, advised to alternate it with Tylenol  arthritis        Other   Generalized anxiety disorder (Chronic)   Anxiety remains adequately controlled with alprazolam  1 mg every morning and 2 mg nightly.  PDMP reviewed and is appropriate.      Hyperlipidemia   On atorvastatin  40 mg once daily Lipid profile reviewed      Relevant Medications   lisinopril  (ZESTRIL ) 5 MG tablet   atorvastatin  (LIPITOR) 40 MG tablet   Prediabetes   Lab Results  Component Value Date   HGBA1C 5.4 09/04/2023   Advised to continue to follow DASH diet On Ozempic 0.5 mg qw      Relevant Orders   Hemoglobin A1c (Completed)   Family history of colon cancer   Her sister was recently diagnosed with colon cancer Her last colonoscopy was in 2020 Referred to GI for discussion of follow-up colonoscopy      Relevant Orders   Ambulatory referral to Gastroenterology    Meds ordered this encounter  Medications   lisinopril  (ZESTRIL )  5 MG tablet    Sig: Take 1 tablet (5 mg total) by mouth daily.    Dispense:  90 tablet    Refill:  3   atorvastatin  (LIPITOR) 40 MG tablet    Sig: Take 1 tablet (40 mg total) by mouth daily.    Dispense:  90 tablet    Refill:  3    Follow-up: Return in about 6 months (around 09/05/2024) for HTN and GAD.    Suzzane MARLA Blanch, MD

## 2024-03-06 ENCOUNTER — Ambulatory Visit: Payer: Self-pay | Admitting: Internal Medicine

## 2024-03-06 DIAGNOSIS — Z800 Family history of malignant neoplasm of digestive organs: Secondary | ICD-10-CM | POA: Insufficient documentation

## 2024-03-06 LAB — BASIC METABOLIC PANEL WITH GFR
BUN/Creatinine Ratio: 15 (ref 12–28)
BUN: 15 mg/dL (ref 8–27)
CO2: 22 mmol/L (ref 20–29)
Calcium: 9.8 mg/dL (ref 8.7–10.3)
Chloride: 106 mmol/L (ref 96–106)
Creatinine, Ser: 0.99 mg/dL (ref 0.57–1.00)
Glucose: 86 mg/dL (ref 70–99)
Potassium: 4.8 mmol/L (ref 3.5–5.2)
Sodium: 141 mmol/L (ref 134–144)
eGFR: 61 mL/min/1.73 (ref >59)

## 2024-03-06 LAB — HEMOGLOBIN A1C
Est. average glucose Bld gHb Est-mCnc: 105 mg/dL
Hgb A1c MFr Bld: 5.3 % (ref 4.8–5.6)

## 2024-03-06 NOTE — Assessment & Plan Note (Signed)
 Her sister was recently diagnosed with colon cancer Her last colonoscopy was in 2020 Referred to GI for discussion of follow-up colonoscopy

## 2024-03-06 NOTE — Assessment & Plan Note (Signed)
 Has severe right hip OA, followed by orthopedic surgeon at [facility] Has had left hip arthroplasty in the past On meloxicam  7.5 mg BID, advised to alternate it with Tylenol  arthritis

## 2024-03-17 ENCOUNTER — Other Ambulatory Visit: Payer: Self-pay | Admitting: Internal Medicine

## 2024-03-17 DIAGNOSIS — F419 Anxiety disorder, unspecified: Secondary | ICD-10-CM

## 2024-03-26 ENCOUNTER — Telehealth: Payer: Self-pay

## 2024-03-26 NOTE — Telephone Encounter (Signed)
 Who is your primary care physician: Dr.Rutwik Patel  Reasons for the colonoscopy: screening  Have you had a colonoscopy before?  Yes 09/11/2015 Audrey Fields  Do you have family history of colon cancer? Yes sister  Previous colonoscopy with polyps removed? no  Do you have a history colorectal cancer?   no  Are you diabetic? If yes, Type 1 or Type 2?    no  Do you have a prosthetic or mechanical heart valve? no  Do you have a pacemaker/defibrillator?   no  Have you had endocarditis/atrial fibrillation? no  Have you had joint replacement within the last 12 months?  no  Do you tend to be constipated or have to use laxatives? yes  Do you have any history of drugs or alchohol?  no  Do you use supplemental oxygen?  no  Have you had a stroke or heart attack within the last 6 months? Not answered  Do you take weight loss medication?  Yes   For female patients: have you had a hysterectomy?  no                                     are you post menopausal?       yes                                            do you still have your menstrual cycle? no      Do you take any blood-thinning medications such as: (aspirin , warfarin, Plavix, Aggrenox)  yes   If yes we need the name, milligram, dosage and who is prescribing doctor ASA 81 mg Current Outpatient Medications on File Prior to Visit  Medication Sig Dispense Refill   ALPRAZolam  (XANAX ) 1 MG tablet TAKE 1 TABLET BY MOUTH IN THE MORNING AND 2 TABLETS DAILY AT BEDTIME FOR ANXIETY 90 tablet 3   atorvastatin  (LIPITOR) 40 MG tablet Take 1 tablet (40 mg total) by mouth daily. 90 tablet 3   lisinopril  (ZESTRIL ) 5 MG tablet Take 1 tablet (5 mg total) by mouth daily. 90 tablet 3   meloxicam  (MOBIC ) 7.5 MG tablet TAKE 1 TABLET BY MOUTH TWICE A DAY 60 tablet 5   Multiple Vitamin (MULTIVITAMIN) capsule Take 1 capsule by mouth daily.     Semaglutide,0.25 or 0.5MG /DOS, (OZEMPIC, 0.25 OR 0.5 MG/DOSE,) 2 MG/3ML SOPN INJECT 0.5 MG EACH WEEK  SUBCUTANEOUSLY. 3 mL 2   No current facility-administered medications on file prior to visit.    Allergies  Allergen Reactions   Gabapentin  Rash   Morphine  Itching and Rash     Pharmacy: [facility]  Primary Insurance Name: [name]  Best number where you can be reached: [phone] or [phone]

## 2024-04-19 NOTE — Telephone Encounter (Signed)
 ASA 2 - hold ozempic for 1 week.

## 2024-04-22 NOTE — Telephone Encounter (Signed)
 LMOVM to call back

## 2024-05-01 NOTE — Telephone Encounter (Signed)
 LMOVM to call back. Letter mailed. ?

## 2024-05-06 ENCOUNTER — Encounter: Payer: Self-pay | Admitting: *Deleted

## 2024-05-06 ENCOUNTER — Other Ambulatory Visit: Payer: Self-pay | Admitting: *Deleted

## 2024-05-06 MED ORDER — NA SULFATE-K SULFATE-MG SULF 17.5-3.13-1.6 GM/177ML PO SOLN: ORAL | 0 refills | Status: DC

## 2024-05-06 NOTE — Telephone Encounter (Signed)
 Pt called to schedule procedure. Dates were given to pt for this month and next month. She states she will have to check with her daughter to see what date will work for her and then she will call back.

## 2024-05-06 NOTE — Telephone Encounter (Signed)
 Pt has been scheduled for 05/21/24 with Dr.Carver. instructions mailed and prep sent to pharmacy.

## 2024-05-10 ENCOUNTER — Encounter (INDEPENDENT_AMBULATORY_CARE_PROVIDER_SITE_OTHER): Payer: Self-pay | Admitting: *Deleted

## 2024-05-10 NOTE — Telephone Encounter (Signed)
 Referral completed, [facility] apt letter sent to PCP

## 2024-05-12 ENCOUNTER — Other Ambulatory Visit: Payer: Self-pay | Admitting: Internal Medicine

## 2024-05-21 ENCOUNTER — Encounter (HOSPITAL_COMMUNITY): Payer: Self-pay | Admitting: Internal Medicine

## 2024-05-21 ENCOUNTER — Ambulatory Visit (HOSPITAL_COMMUNITY)
Admission: RE | Admit: 2024-05-21 | Discharge: 2024-05-21 | Disposition: A | Discharge status: Home / Self Care | Attending: Internal Medicine | Admitting: Internal Medicine

## 2024-05-21 ENCOUNTER — Encounter (HOSPITAL_COMMUNITY)
Admission: RE | Disposition: A | Discharge status: Home / Self Care | Payer: Self-pay | Source: Home / Self Care | Attending: Internal Medicine

## 2024-05-21 ENCOUNTER — Ambulatory Visit (HOSPITAL_COMMUNITY): Admitting: Certified Registered"

## 2024-05-21 ENCOUNTER — Other Ambulatory Visit: Payer: Self-pay

## 2024-05-21 DIAGNOSIS — M1990 Unspecified osteoarthritis, unspecified site: Secondary | ICD-10-CM | POA: Diagnosis not present

## 2024-05-21 DIAGNOSIS — Z800 Family history of malignant neoplasm of digestive organs: Secondary | ICD-10-CM

## 2024-05-21 DIAGNOSIS — Z1211 Encounter for screening for malignant neoplasm of colon: Secondary | ICD-10-CM | POA: Insufficient documentation

## 2024-05-21 DIAGNOSIS — K648 Other hemorrhoids: Secondary | ICD-10-CM | POA: Insufficient documentation

## 2024-05-21 DIAGNOSIS — D123 Benign neoplasm of transverse colon: Secondary | ICD-10-CM | POA: Insufficient documentation

## 2024-05-21 DIAGNOSIS — K5730 Diverticulosis of large intestine without perforation or abscess without bleeding: Secondary | ICD-10-CM | POA: Insufficient documentation

## 2024-05-21 DIAGNOSIS — I10 Essential (primary) hypertension: Secondary | ICD-10-CM | POA: Diagnosis not present

## 2024-05-21 DIAGNOSIS — Z87891 Personal history of nicotine dependence: Secondary | ICD-10-CM | POA: Diagnosis not present

## 2024-05-21 HISTORY — PX: COLONOSCOPY: SHX5424

## 2024-05-21 SURGERY — COLONOSCOPY
Anesthesia: General

## 2024-05-21 MED ORDER — PROPOFOL 10 MG/ML IV BOLUS
INTRAVENOUS | Status: DC | PRN
  Administered 2024-05-21: 80 mg via INTRAVENOUS
  Administered 2024-05-21: 125 ug/kg/min via INTRAVENOUS

## 2024-05-21 MED ORDER — LACTATED RINGERS IV SOLN: INTRAVENOUS | Status: DC | PRN

## 2024-05-21 MED ORDER — LIDOCAINE HCL (CARDIAC) PF 100 MG/5ML IV SOSY
PREFILLED_SYRINGE | INTRAVENOUS | Status: DC | PRN
  Administered 2024-05-21: 50 mg via INTRAVENOUS

## 2024-05-21 MED ORDER — LACTATED RINGERS IV SOLN: INTRAVENOUS | Status: DC

## 2024-05-21 NOTE — Op Note (Signed)
 [facility] Patient Name: [name] Procedure Date: 05/21/2024 9:12 AM MRN: [id number] Date of Birth: 02-Feb-1951 Attending MD: Carlin POUR. Cindie , [place], [id number] CSN: [id number] Age: 73 Admit Type: Outpatient Procedure:                Colonoscopy Indications:              Colon cancer screening in patient at increased                            risk: Colorectal cancer in sister Providers:                Carlin POUR. Cindie, DO, Jon LABOR. Gerome RN, RN,                            Bascom Blush Referring MD:              Medicines:                See the Anesthesia note for documentation of the                            administered medications Complications:            No immediate complications. Estimated Blood Loss:     Estimated blood loss was minimal. Procedure:                Pre-Anesthesia Assessment:                           - The anesthesia plan was to use monitored                            anesthesia care (MAC).                           After obtaining informed consent, the colonoscope                            was passed under direct vision. Throughout the                            procedure, the patient's blood pressure, pulse, and                            oxygen saturations were monitored continuously. The                            PCF-[id number] ([id number]) was introduced through the                            anus and advanced to the the cecum, identified by                            appendiceal orifice and ileocecal valve. The                            colonoscopy was performed without  difficulty. The                            patient tolerated the procedure well. The quality                            of the bowel preparation was evaluated using the                            BBPS [facility] Bowel Preparation Scale) with scores                            of: Right Colon = 3, Transverse Colon = 3 and Left                            Colon = 3 (entire mucosa  seen well with no residual                            staining, small fragments of stool or opaque                            liquid). The total BBPS score equals 9. Scope In: 9:20:36 AM Scope Out: 9:34:28 AM Scope Withdrawal Time: 0 hours 6 minutes 50 seconds  Total Procedure Duration: 0 hours 13 minutes 52 seconds  Findings:      Non-bleeding internal hemorrhoids were found.      A few medium-mouthed diverticula were found in the sigmoid colon.      A 4 mm polyp was found in the transverse colon. The polyp was sessile.       The polyp was removed with a cold snare. Resection and retrieval were       complete.      The exam was otherwise without abnormality. Impression:               - Non-bleeding internal hemorrhoids.                           - Diverticulosis in the sigmoid colon.                           - One 4 mm polyp in the transverse colon, removed                            with a cold snare. Resected and retrieved.                           - The examination was otherwise normal. Moderate Sedation:      Per Anesthesia Care Recommendation:           - Patient has a contact number available for                            emergencies. The signs and symptoms of potential  delayed complications were discussed with the                            patient. Return to normal activities tomorrow.                            Written discharge instructions were provided to the                            patient.                           - Resume previous diet.                           - Continue present medications.                           - Await pathology results.                           - Repeat colonoscopy in 5 years for surveillance.                           - Return to GI clinic PRN. Procedure Code(s):        --- Professional ---                           [phone], Colonoscopy, flexible; with removal of                            tumor(s),  polyp(s), or other lesion(s) by snare                            technique Diagnosis Code(s):        --- Professional ---                           Z80.0, Family history of malignant neoplasm of                            digestive organs                           K64.8, Other hemorrhoids                           D12.3, Benign neoplasm of transverse colon (hepatic                            flexure or splenic flexure)                           K57.30, Diverticulosis of large intestine without                            perforation or abscess without bleeding CPT copyright 2022 American Medical  Association. All rights reserved. The codes documented in this report are preliminary and upon coder review may  be revised to meet current compliance requirements. Carlin POUR. Cindie, DO Carlin POUR. Cindie, DO 05/21/2024 9:38:26 AM This report has been signed electronically. Number of Addenda: 0

## 2024-05-21 NOTE — H&P (Signed)
 Primary Care Physician:  Tobie Suzzane POUR, MD Primary Gastroenterologist:  Dr. Cindie  Pre-Procedure History & Physical: HPI:  [name] is a 73 y.o. female is here for a colonoscopy to be performed for high risk colon cancer screening purposes, family history of colon cancer in sister  Past Medical History:  Diagnosis Date   Anxiety    panic attack   Arthritis    Carpal tunnel syndrome on both sides    Complication of anesthesia    1st knee replacement slow to awaken, 2nd knee replacement - woke up had to have more anesthesia   High cholesterol    History of kidney stones    Hypertension    Knee joint replacement status    Shortness of breath dyspnea    with exertion    Past Surgical History:  Procedure Laterality Date   APPENDECTOMY     BREAST CYST EXCISION     BUNIONECTOMY  09/29/2011   RIGHT   CARPAL TUNNEL RELEASE  04/13/2012   rocedure: CARPAL TUNNEL RELEASE;  Surgeon: Taft FORBES Minerva, MD;  Location: [facility];  Service: Orthopedics;  Laterality: Right;   CARPAL TUNNEL RELEASE Left 11/26/2021   Procedure: CARPAL TUNNEL RELEASE;  Surgeon: Minerva Taft FORBES, MD;  Location: [facility];  Service: Orthopedics;  Laterality: Left;   COLONOSCOPY N/A 09/11/2015   Procedure: COLONOSCOPY;  Surgeon: Margo LITTIE Haddock, MD;  Location: [facility];  Service: Endoscopy;  Laterality: N/A;  1000   hip repalcement Left 2021   JOINT REPLACEMENT Bilateral 07/25/2006   bilateral knees, [facility]; [place]   KNEE SURGERY     LAMINECTOMY     ORIF HUMERUS FRACTURE Right 01/11/2016   Procedure: OPEN REDUCTION INTERNAL FIXATION (ORIF) RIGHT PROXIMAL HUMERUS FRACTURE;  Surgeon: Glendia Cordella Hutchinson, MD;  Location: [facility] OR;  Service: Orthopedics;  Laterality: Right;    Prior to Admission medications   Medication Sig Start Date End Date Taking? Authorizing Provider  ALPRAZolam  (XANAX ) 1 MG tablet TAKE 1 TABLET BY MOUTH IN THE MORNING AND 2 TABLETS DAILY AT BEDTIME FOR ANXIETY 03/18/24  Yes Tobie Suzzane POUR,  MD  aspirin  EC 81 MG tablet Take 81 mg by mouth daily. Swallow whole.   Yes [provider]  atorvastatin  (LIPITOR) 40 MG tablet Take 1 tablet (40 mg total) by mouth daily. 03/05/24  Yes Tobie Suzzane POUR, MD  lisinopril  (ZESTRIL ) 5 MG tablet Take 1 tablet (5 mg total) by mouth daily. 03/05/24  Yes Tobie Suzzane POUR, MD  meloxicam  (MOBIC ) 7.5 MG tablet TAKE 1 TABLET BY MOUTH TWICE A DAY 02/16/24  Yes Minerva Taft FORBES, MD  Multiple Vitamin (MULTIVITAMIN) capsule Take 1 capsule by mouth daily.   Yes [provider]  Na Sulfate-K Sulfate-Mg Sulfate concentrate (SUPREP) 17.5-3.13-1.6 GM/177ML SOLN As directed 05/06/24  Yes Aline Wesche K, DO  Semaglutide,0.25 or 0.5MG /DOS, (OZEMPIC, 0.25 OR 0.5 MG/DOSE,) 2 MG/3ML SOPN INJECT 0.5 MG EACH WEEK SUBCUTANEOUSLY. 05/13/24   Tobie Suzzane POUR, MD    Allergies as of 05/06/2024 - Review Complete 03/26/2024  Allergen Reaction Noted   Gabapentin  Rash 03/29/2012   Morphine  Itching and Rash 01/04/2021    Family History  Problem Relation Age of Onset   Dementia Mother    Arthritis Mother    Hypertension Mother    Colon polyps Mother    Cancer Father        prostate, lung   COPD Father    Heart disease Father    Colon polyps Father  Congestive Heart Failure Father    Hyperlipidemia Sister    Colon polyps Sister    Hyperlipidemia Brother    Colon polyps Brother    Birth defects Son    Early death Son    Congestive Heart Failure Paternal Aunt    Cancer Maternal Grandmother        lung   Diabetes Maternal Grandfather    Heart attack Maternal Grandfather    Heart disease Paternal Grandmother    Stroke Paternal Grandmother    Congestive Heart Failure Paternal Grandmother    Cancer Paternal Grandfather        lung   Cancer Brother        liver   Diabetes Brother    Irritable bowel syndrome Daughter    Congestive Heart Failure Paternal Aunt    Anesthesia problems Neg Hx    Hypotension Neg Hx    Malignant hyperthermia Neg Hx     Pseudochol deficiency Neg Hx     Social History   Socioeconomic History   Marital status: Married    Spouse name: Not on file   Number of children: Not on file   Years of education: Not on file   Highest education level: Not on file  Occupational History   Not on file  Tobacco Use   Smoking status: Former    Current packs/day: 0.00    Types: Cigarettes    Start date: 11/25/1980    Quit date: 11/26/2010    Years since quitting: 13.4   Smokeless tobacco: Never  Vaping Use   Vaping status: Never Used  Substance and Sexual Activity   Alcohol use: Yes    Alcohol/week: 2.0 standard drinks of alcohol    Types: 2 Glasses of wine per week    Comment: occasional wine   Drug use: No   Sexual activity: Not Currently    Birth control/protection: Post-menopausal  Other Topics Concern   Not on file  Social History Narrative   RAISED KID. THEN WORKED FOR 12 YRS. TROUBLE WITH HIPS AND KNEES TOOK HER OUT OF WORK DAUGHTER WORKS FOR [employer]. ANOTHER DAUGHTER STUDYING TO BE A NURSE.   Social Drivers of Corporate Investment Banker Strain: Low Risk  (06/07/2023)   Overall Financial Resource Strain (CARDIA)    Difficulty of Paying Living Expenses: Not hard at all  Food Insecurity: No Food Insecurity (06/07/2023)   Hunger Vital Sign    Worried About Running Out of Food in the Last Year: Never true    Ran Out of Food in the Last Year: Never true  Transportation Needs: No Transportation Needs (06/07/2023)   PRAPARE - Administrator, Civil Service (Medical): No    Lack of Transportation (Non-Medical): No  Physical Activity: Sufficiently Active (06/07/2023)   Exercise Vital Sign    Days of Exercise per Week: 5 days    Minutes of Exercise per Session: 30 min  Stress: No Stress Concern Present (06/07/2023)   [employer] of Occupational Health - Occupational Stress Questionnaire    Feeling of Stress : Not at all  Social Connections: Moderately Integrated (06/07/2023)    Social Connection and Isolation Panel    Frequency of Communication with Friends and Family: More than three times a week    Frequency of Social Gatherings with Friends and Family: More than three times a week    Attends Religious Services: More than 4 times per year    Active Member of Clubs or Organizations: No  Attends Banker Meetings: Never    Marital Status: Married  Catering Manager Violence: Not At Risk (06/07/2023)   Humiliation, Afraid, Rape, and Kick questionnaire    Fear of Current or Ex-Partner: No    Emotionally Abused: No    Physically Abused: No    Sexually Abused: No    Review of Systems: See HPI, otherwise negative ROS  Physical Exam: Vital signs in last 24 hours: Temp:  [97.9 F (36.6 C)] 97.9 F (36.6 C) (10/28 0815) Pulse Rate:  [68] 68 (10/28 0815) Resp:  [22] 22 (10/28 0815) BP: (125)/(59) 125/59 (10/28 0815) SpO2:  [98 %] 98 % (10/28 0815) Weight:  [67.6 kg] 67.6 kg (10/28 0815)   General:   Alert,  Well-developed, well-nourished, pleasant and cooperative in NAD Head:  Normocephalic and atraumatic. Eyes:  Sclera clear, no icterus.   Conjunctiva pink. Ears:  Normal auditory acuity. Nose:  No deformity, discharge,  or lesions. Mouth:  No deformity or lesions, dentition normal. Neck:  Supple; no masses or thyromegaly. Lungs:  Clear throughout to auscultation.   No wheezes, crackles, or rhonchi. No acute distress. Heart:  Regular rate and rhythm; no murmurs, clicks, rubs,  or gallops. Abdomen:  Soft, nontender and nondistended. No masses, hepatosplenomegaly or hernias noted. Normal bowel sounds, without guarding, and without rebound.   Msk:  Symmetrical without gross deformities. Normal posture. Extremities:  Without clubbing or edema. Neurologic:  Alert and  oriented x4;  grossly normal neurologically. Skin:  Intact without significant lesions or rashes. Cervical Nodes:  No significant cervical adenopathy. Psych:  Alert and cooperative.  Normal mood and affect.  Impression/Plan: [name] is here for a colonoscopy to be performed for high risk colon cancer screening purposes, family history of colon cancer in sister  The risks of the procedure including infection, bleed, or perforation as well as benefits, limitations, alternatives and imponderables have been reviewed with the patient. Questions have been answered. All parties agreeable.

## 2024-05-21 NOTE — Anesthesia Preprocedure Evaluation (Signed)
 Anesthesia Evaluation  Patient identified by MRN, date of birth, ID band Patient awake    Reviewed: Allergy & Precautions, H&P , NPO status , Patient's Chart, lab work & pertinent test results  History of Anesthesia Complications (+) history of anesthetic complications  Airway Mallampati: II  TM Distance: >3 FB Neck ROM: Full    Dental no notable dental hx.    Pulmonary shortness of breath, former smoker   Pulmonary exam normal breath sounds clear to auscultation       Cardiovascular hypertension, Normal cardiovascular exam Rhythm:Regular Rate:Normal     Neuro/Psych  PSYCHIATRIC DISORDERS Anxiety      Neuromuscular disease    GI/Hepatic negative GI ROS, Neg liver ROS,,,  Endo/Other  negative endocrine ROS    Renal/GU negative Renal ROS  negative genitourinary   Musculoskeletal  (+) Arthritis ,    Abdominal   Peds negative pediatric ROS (+)  Hematology negative hematology ROS (+)   Anesthesia Other Findings   Reproductive/Obstetrics negative OB ROS                              Anesthesia Physical Anesthesia Plan  ASA: 2  Anesthesia Plan: General   Post-op Pain Management:    Induction: Intravenous  PONV Risk Score and Plan:   Airway Management Planned: Nasal Cannula  Additional Equipment:   Intra-op Plan:   Post-operative Plan:   Informed Consent: I have reviewed the patients History and Physical, chart, labs and discussed the procedure including the risks, benefits and alternatives for the proposed anesthesia with the patient or authorized representative who has indicated his/her understanding and acceptance.     Dental advisory given  Plan Discussed with: CRNA  Anesthesia Plan Comments:         Anesthesia Quick Evaluation

## 2024-05-21 NOTE — Anesthesia Postprocedure Evaluation (Signed)
 Anesthesia Post Note  Patient: [name]  Procedure(s) Performed: COLONOSCOPY  Patient location during evaluation: PACU Anesthesia Type: General Level of consciousness: awake and alert Pain management: pain level controlled Vital Signs Assessment: post-procedure vital signs reviewed and stable Respiratory status: spontaneous breathing, nonlabored ventilation, respiratory function stable and patient connected to nasal cannula oxygen Cardiovascular status: blood pressure returned to baseline and stable Postop Assessment: no apparent nausea or vomiting Anesthetic complications: no   No notable events documented.   Last Vitals:  Vitals:   05/21/24 0815 05/21/24 0937  BP: (!) 125/59 (!) 103/49  Pulse: 68 72  Resp: (!) 22 (!) 22  Temp: 36.6 C 36.6 C  SpO2: 98% 96%    Last Pain:  Vitals:   05/21/24 0937  TempSrc: Oral  PainSc: 0-No pain                 Andrea Limes

## 2024-05-21 NOTE — Transfer of Care (Signed)
 Immediate Anesthesia Transfer of Care Note  Patient: [name]  Procedure(s) Performed: COLONOSCOPY  Patient Location: Endoscopy Unit  Anesthesia Type:General  Level of Consciousness: drowsy, patient cooperative, and responds to stimulation  Airway & Oxygen Therapy: Patient Spontanous Breathing  Post-op Assessment: Report given to RN and Post -op Vital signs reviewed and stable  Post vital signs: Reviewed and stable  Last Vitals:  Vitals Value Taken Time  BP    Temp 36.6 C 05/21/24 09:37  Pulse 72 05/21/24 09:37  Resp 22 05/21/24 09:37  SpO2 96 % 05/21/24 09:37    Last Pain:  Vitals:   05/21/24 9062  TempSrc: Oral  PainSc: 0-No pain      Patients Stated Pain Goal: 7 (05/21/24 0815)  Complications: No notable events documented.

## 2024-05-21 NOTE — Discharge Instructions (Addendum)
  Colonoscopy Discharge Instructions  Read the instructions outlined below and refer to this sheet in the next few weeks. These discharge instructions provide you with general information on caring for yourself after you leave the hospital. Your doctor may also give you specific instructions. While your treatment has been planned according to the most current medical practices available, unavoidable complications occasionally occur.   ACTIVITY You may resume your regular activity, but move at a slower pace for the next 24 hours.  Take frequent rest periods for the next 24 hours.  Walking will help get rid of the air and reduce the bloated feeling in your belly (abdomen).  No driving for 24 hours (because of the medicine (anesthesia) used during the test).   Do not sign any important legal documents or operate any machinery for 24 hours (because of the anesthesia used during the test).  NUTRITION Drink plenty of fluids.  You may resume your normal diet as instructed by your doctor.  Begin with a light meal and progress to your normal diet. Heavy or fried foods are harder to digest and may make you feel sick to your stomach (nauseated).  Avoid alcoholic beverages for 24 hours or as instructed.  MEDICATIONS You may resume your normal medications unless your doctor tells you otherwise.  WHAT YOU CAN EXPECT TODAY Some feelings of bloating in the abdomen.  Passage of more gas than usual.  Spotting of blood in your stool or on the toilet paper.  IF YOU HAD POLYPS REMOVED DURING THE COLONOSCOPY: No aspirin  products for 7 days or as instructed.  No alcohol for 7 days or as instructed.  Eat a soft diet for the next 24 hours.  FINDING OUT THE RESULTS OF YOUR TEST Not all test results are available during your visit. If your test results are not back during the visit, make an appointment with your caregiver to find out the results. Do not assume everything is normal if you have not heard from your  caregiver or the medical facility. It is important for you to follow up on all of your test results.  SEEK IMMEDIATE MEDICAL ATTENTION IF: You have more than a spotting of blood in your stool.  Your belly is swollen (abdominal distention).  You are nauseated or vomiting.  You have a temperature over 101.  You have abdominal pain or discomfort that is severe or gets worse throughout the day.   Your colonoscopy revealed 1 polyp(s) which I removed successfully. Await pathology results, my office will contact you. I recommend repeating colonoscopy in 5 years for surveillance purposes, and family history of colon cancer   You also have diverticulosis and internal hemorrhoids. I would recommend increasing fiber in your diet or adding OTC Benefiber/Metamucil. Be sure to drink at least 4 to 6 glasses of water  daily. Follow-up with GI as needed.   I hope you have a great rest of your week!  Carlin POUR. Cindie, D.O. Gastroenterology and Hepatology [facility]

## 2024-05-22 ENCOUNTER — Encounter (HOSPITAL_COMMUNITY): Payer: Self-pay | Admitting: Internal Medicine

## 2024-05-22 LAB — SURGICAL PATHOLOGY

## 2024-06-13 ENCOUNTER — Ambulatory Visit

## 2024-06-13 VITALS — Ht 64.5 in | Wt 146.2 lb

## 2024-06-13 DIAGNOSIS — Z0000 Encounter for general adult medical examination without abnormal findings: Secondary | ICD-10-CM

## 2024-06-13 DIAGNOSIS — Z780 Asymptomatic menopausal state: Secondary | ICD-10-CM

## 2024-06-13 DIAGNOSIS — Z13820 Encounter for screening for osteoporosis: Secondary | ICD-10-CM

## 2024-06-13 DIAGNOSIS — Z1231 Encounter for screening mammogram for malignant neoplasm of breast: Secondary | ICD-10-CM

## 2024-06-13 NOTE — Progress Notes (Signed)
 Chief Complaint  Patient presents with   Medicare Wellness     Subjective:   [name] is a 73 y.o. female who presents for a Medicare Annual Wellness Visit.  Allergies (verified) Gabapentin  and Morphine    History: Past Medical History:  Diagnosis Date   Anxiety    panic attack   Arthritis    Carpal tunnel syndrome on both sides    Complication of anesthesia    1st knee replacement slow to awaken, 2nd knee replacement - woke up had to have more anesthesia   High cholesterol    History of kidney stones    Hypertension    Knee joint replacement status    Shortness of breath dyspnea    with exertion   Past Surgical History:  Procedure Laterality Date   APPENDECTOMY     BREAST CYST EXCISION     BUNIONECTOMY  09/29/2011   RIGHT   CARPAL TUNNEL RELEASE  04/13/2012   rocedure: CARPAL TUNNEL RELEASE;  Surgeon: Taft FORBES Minerva, MD;  Location: [facility];  Service: Orthopedics;  Laterality: Right;   CARPAL TUNNEL RELEASE Left 11/26/2021   Procedure: CARPAL TUNNEL RELEASE;  Surgeon: Minerva Taft FORBES, MD;  Location: [facility];  Service: Orthopedics;  Laterality: Left;   COLONOSCOPY N/A 09/11/2015   Procedure: COLONOSCOPY;  Surgeon: Margo LITTIE Haddock, MD;  Location: [facility];  Service: Endoscopy;  Laterality: N/A;  1000   COLONOSCOPY N/A 05/21/2024   Procedure: COLONOSCOPY;  Surgeon: Cindie Carlin MARLA, DO;  Location: [facility];  Service: Endoscopy;  Laterality: N/A;  9:30 am, asa 2   hip repalcement Left 2021   JOINT REPLACEMENT Bilateral 07/25/2006   bilateral knees, [facility]; [place]   KNEE SURGERY     LAMINECTOMY     ORIF HUMERUS FRACTURE Right 01/11/2016   Procedure: OPEN REDUCTION INTERNAL FIXATION (ORIF) RIGHT PROXIMAL HUMERUS FRACTURE;  Surgeon: Glendia Cordella Hutchinson, MD;  Location: [facility];  Service: Orthopedics;  Laterality: Right;   Family History  Problem Relation Age of Onset   Dementia Mother    Arthritis Mother    Hypertension Mother    Colon polyps Mother     Cancer Father        prostate, lung   COPD Father    Heart disease Father    Colon polyps Father    Congestive Heart Failure Father    Hyperlipidemia Sister    Colon polyps Sister    Hyperlipidemia Brother    Colon polyps Brother    Birth defects Son    Early death Son    Congestive Heart Failure Paternal Aunt    Cancer Maternal Grandmother        lung   Diabetes Maternal Grandfather    Heart attack Maternal Grandfather    Heart disease Paternal Grandmother    Stroke Paternal Grandmother    Congestive Heart Failure Paternal Grandmother    Cancer Paternal Grandfather        lung   Cancer Brother        liver   Diabetes Brother    Irritable bowel syndrome Daughter    Congestive Heart Failure Paternal Aunt    Anesthesia problems Neg Hx    Hypotension Neg Hx    Malignant hyperthermia Neg Hx    Pseudochol deficiency Neg Hx    Social History   Occupational History   Not on file  Tobacco Use   Smoking status: Former    Current packs/day: 0.00    Types:  Cigarettes    Start date: 11/25/1980    Quit date: 11/26/2010    Years since quitting: 13.5   Smokeless tobacco: Never  Vaping Use   Vaping status: Never Used  Substance and Sexual Activity   Alcohol use: Yes    Alcohol/week: 2.0 standard drinks of alcohol    Types: 2 Glasses of wine per week    Comment: occasional wine   Drug use: No   Sexual activity: Not Currently    Birth control/protection: Post-menopausal   Tobacco Counseling Counseling given: Yes  SDOH Screenings   Food Insecurity: No Food Insecurity (06/13/2024)  Housing: Low Risk  (06/13/2024)  Transportation Needs: No Transportation Needs (06/13/2024)  Utilities: Not At Risk (06/13/2024)  Alcohol Screen: Low Risk  (06/07/2023)  Depression (PHQ2-9): Low Risk  (06/13/2024)  Financial Resource Strain: Low Risk  (06/07/2023)  Physical Activity: Insufficiently Active (06/13/2024)  Social Connections: Socially Integrated (06/13/2024)  Stress: No Stress  Concern Present (06/13/2024)  Tobacco Use: Medium Risk (06/13/2024)  Health Literacy: Adequate Health Literacy (06/13/2024)   See flowsheets for full screening details  Depression Screen PHQ 2 & 9 Depression Scale- Over the past 2 weeks, how often have you been bothered by any of the following problems? Little interest or pleasure in doing things: 0 Feeling down, depressed, or hopeless (PHQ Adolescent also includes...irritable): 0 PHQ-2 Total Score: 0 Trouble falling or staying asleep, or sleeping too much: 0 Feeling tired or having little energy: 0 Poor appetite or overeating (PHQ Adolescent also includes...weight loss): 0 Feeling bad about yourself - or that you are a failure or have let yourself or your family down: 0 Trouble concentrating on things, such as reading the newspaper or watching television (PHQ Adolescent also includes...like school work): 0 Moving or speaking so slowly that other people could have noticed. Or the opposite - being so fidgety or restless that you have been moving around a lot more than usual: 0 Thoughts that you would be better off dead, or of hurting yourself in some way: 0 PHQ-9 Total Score: 0 If you checked off any problems, how difficult have these problems made it for you to do your work, take care of things at home, or get along with other people?: Not difficult at all  Depression Treatment Depression Interventions/Treatment : EYV7-0 Score <4 Follow-up Not Indicated     Goals Addressed               This Visit's Progress     Remain active and healthy (pt-stated)         Visit info / Clinical Intake: Medicare Wellness Visit Type:: Subsequent Annual Wellness Visit Persons participating in visit:: patient Medicare Wellness Visit Mode:: Video Because this visit was a virtual/telehealth visit:: pt reported vitals If Telephone or Video please confirm:: I connected with the patient using audio enabled telemedicine application and verified that  I am speaking with the correct person using two identifiers; I discussed the limitations of evaluation and management by telemedicine; The patient expressed understanding and agreed to proceed Patient Location:: home Provider Location:: office Information given by:: patient Interpreter Needed?: No Pre-visit prep was completed: yes AWV questionnaire completed by patient prior to visit?: no Living arrangements:: with family/others Patient's Overall Health Status Rating: very good Typical amount of pain: none Does pain affect daily life?: no  Dietary Habits and Nutritional Risks How many meals a day?: 2 Eats fruit and vegetables daily?: yes Most meals are obtained by: preparing own meals In the last 2 weeks,  have you had any of the following?: none Diabetic:: no  Functional Status Activities of Daily Living (to include ambulation/medication): Independent Ambulation: Independent Medication Administration: Independent Home Management: Independent Manage your own finances?: yes Primary transportation is: driving Concerns about vision?: (!) yes Concerns about hearing?: no  Fall Screening Falls in the past year?: 0 Number of falls in past year: 0 Was there an injury with Fall?: 0 Fall Risk Category Calculator: 0 Patient Fall Risk Level: Low Fall Risk  Fall Risk Patient at Risk for Falls Due to: No Fall Risks Fall risk Follow up: Falls prevention discussed; Education provided; Falls evaluation completed  Home and Transportation Safety: All rugs have non-skid backing?: N/A, no rugs All stairs or steps have railings?: (!) no Grab bars in the bathtub or shower?: yes Have non-skid surface in bathtub or shower?: (!) no Good home lighting?: yes Regular seat belt use?: yes Hospital stays in the last year:: no  Cognitive Assessment Difficulty concentrating, remembering, or making decisions? : no Will 6CIT or Mini Cog be Completed: no 6CIT or Mini Cog Declined: patient alert,  oriented, able to answer questions appropriately and recall recent events  Advance Directives (For Healthcare) Does Patient Have a Medical Advance Directive?: No Would patient like information on creating a medical advance directive?: Yes (MAU/Ambulatory/Procedural Areas - Information given)  Reviewed/Updated  Reviewed/Updated: Reviewed All (Medical, Surgical, Family, Medications, Allergies, Care Teams, Patient Goals)        Objective:    Today's Vitals   06/13/24 0843  Weight: 146 lb 3.2 oz (66.3 kg)  Height: 5' 4.5 (1.638 m)   Body mass index is 24.71 kg/m.  Current Medications (verified) Outpatient Encounter Medications as of 06/13/2024  Medication Sig   ALPRAZolam  (XANAX ) 1 MG tablet TAKE 1 TABLET BY MOUTH IN THE MORNING AND 2 TABLETS DAILY AT BEDTIME FOR ANXIETY   aspirin  EC 81 MG tablet Take 81 mg by mouth daily. Swallow whole.   atorvastatin  (LIPITOR) 40 MG tablet Take 1 tablet (40 mg total) by mouth daily.   lisinopril  (ZESTRIL ) 5 MG tablet Take 1 tablet (5 mg total) by mouth daily.   meloxicam  (MOBIC ) 7.5 MG tablet TAKE 1 TABLET BY MOUTH TWICE A DAY   Multiple Vitamin (MULTIVITAMIN) capsule Take 1 capsule by mouth daily.   Semaglutide,0.25 or 0.5MG /DOS, (OZEMPIC, 0.25 OR 0.5 MG/DOSE,) 2 MG/3ML SOPN INJECT 0.5 MG EACH WEEK SUBCUTANEOUSLY.   No facility-administered encounter medications on file as of 06/13/2024.   Hearing/Vision screen Hearing Screening - Comments:: Patient denies any hearing difficulties.   Vision Screening - Comments:: Wears rx glasses - up to date with routine eye exams with  Oneil Kawasaki in [place] at [facility] Immunizations and Health Maintenance Health Maintenance  Topic Date Due   COVID-19 Vaccine (5 - 2025-26 season) 03/25/2024   Medicare Annual Wellness (AWV)  06/06/2024   Mammogram  08/06/2025   DTaP/Tdap/Td (2 - Td or Tdap) 04/06/2033   Colonoscopy  05/21/2034   Pneumococcal Vaccine: 50+ Years  Completed   Influenza Vaccine   Completed   Bone Density Scan  Completed   Hepatitis C Screening  Completed   Zoster Vaccines- Shingrix  Completed   Meningococcal B Vaccine  Aged Out        Assessment/Plan:  This is a routine wellness examination for [place].  Patient Care Team: Tobie Suzzane POUR, MD as PCP - General (Internal Medicine) Court Dorn PARAS, MD as PCP - Cardiology (Cardiology) Harvey Margo CROME, MD (Inactive) as Consulting Physician (Gastroenterology)  Cindie Carlin POUR, DO as Consulting Physician (Internal Medicine)  I have personally reviewed and noted the following in the patient's chart:   Medical and social history Use of alcohol, tobacco or illicit drugs  Current medications and supplements including opioid prescriptions. Functional ability and status Nutritional status Physical activity Advanced directives List of other physicians Hospitalizations, surgeries, and ER visits in previous 12 months Vitals Screenings to include cognitive, depression, and falls Referrals and appointments  No orders of the defined types were placed in this encounter.  In addition, I have reviewed and discussed with patient certain preventive protocols, quality metrics, and best practice recommendations. A written personalized care plan for preventive services as well as general preventive health recommendations were provided to patient.   Adetokunbo Mccadden, CMA   06/13/2024   No follow-ups on file.  After Visit Summary: (Mail) Due to this being a telephonic visit, the after visit summary with patients personalized plan was offered to patient via mail   Nurse Notes: mammogram and bone density scans ordered

## 2024-06-13 NOTE — Patient Instructions (Signed)
 [name],  Thank you for taking the time for your Medicare Wellness Visit. I appreciate your continued commitment to your health goals. Please review the care plan we discussed, and feel free to reach out if I can assist you further.  Please note that Annual Wellness Visits do not include a physical exam. Some assessments may be limited, especially if the visit was conducted virtually. If needed, we may recommend an in-person follow-up with your provider.  Ongoing Care Seeing your primary care provider every 3 to 6 months helps us  monitor your health and provide consistent, personalized care.   1 year Medicare Wellness Followup: Tuesday June 17, 2025 at 8:40 am in person  Referrals If a referral was made during today's visit and you haven't received any updates within two weeks, please contact the referred provider directly to check on the status.  Mammogram/Bone Density Screening: Call [facility] @ Phone: [phone]   Recommended Screenings:  Health Maintenance  Topic Date Due   COVID-19 Vaccine (5 - 2025-26 season) 03/25/2024   Medicare Annual Wellness Visit  06/06/2024   Breast Cancer Screening  08/06/2025   DTaP/Tdap/Td vaccine (2 - Td or Tdap) 04/06/2033   Colon Cancer Screening  05/21/2034   Pneumococcal Vaccine for age over 53  Completed   Flu Shot  Completed   Osteoporosis screening with Bone Density Scan  Completed   Hepatitis C Screening  Completed   Zoster (Shingles) Vaccine  Completed   Meningitis B Vaccine  Aged Out       06/13/2024    8:48 AM  Advanced Directives  Does Patient Have a Medical Advance Directive? No  Would patient like information on creating a medical advance directive? Yes (MAU/Ambulatory/Procedural Areas - Information given)    Vision: Annual vision screenings are recommended for early detection of glaucoma, cataracts, and diabetic retinopathy. These exams can also reveal signs of chronic conditions such as diabetes and high  blood pressure.  Dental: Annual dental screenings help detect early signs of oral cancer, gum disease, and other conditions linked to overall health, including heart disease and diabetes.  Please see the attached documents for additional preventive care recommendations.

## 2024-07-09 ENCOUNTER — Ambulatory Visit: Admitting: Internal Medicine

## 2024-07-09 ENCOUNTER — Encounter: Payer: Self-pay | Admitting: Internal Medicine

## 2024-07-09 VITALS — BP 121/70 | HR 75 | Ht 64.0 in | Wt 145.4 lb

## 2024-07-09 DIAGNOSIS — I10 Essential (primary) hypertension: Secondary | ICD-10-CM

## 2024-07-09 DIAGNOSIS — Z0001 Encounter for general adult medical examination with abnormal findings: Secondary | ICD-10-CM | POA: Diagnosis not present

## 2024-07-09 DIAGNOSIS — F411 Generalized anxiety disorder: Secondary | ICD-10-CM | POA: Diagnosis not present

## 2024-07-09 DIAGNOSIS — E782 Mixed hyperlipidemia: Secondary | ICD-10-CM | POA: Diagnosis not present

## 2024-07-09 DIAGNOSIS — E559 Vitamin D deficiency, unspecified: Secondary | ICD-10-CM | POA: Diagnosis not present

## 2024-07-09 DIAGNOSIS — R7303 Prediabetes: Secondary | ICD-10-CM | POA: Diagnosis not present

## 2024-07-09 DIAGNOSIS — E88819 Insulin resistance, unspecified: Secondary | ICD-10-CM

## 2024-07-09 MED ORDER — ATORVASTATIN CALCIUM 40 MG PO TABS: 40.0000 mg | ORAL_TABLET | Freq: Every day | ORAL | 3 refills | Status: AC

## 2024-07-09 MED ORDER — OZEMPIC (0.25 OR 0.5 MG/DOSE) 2 MG/3ML ~~LOC~~ SOPN: 0.5000 mg | PEN_INJECTOR | SUBCUTANEOUS | 5 refills | Status: AC

## 2024-07-09 MED ORDER — LISINOPRIL 5 MG PO TABS: 5.0000 mg | ORAL_TABLET | Freq: Every day | ORAL | 3 refills | Status: AC

## 2024-07-09 NOTE — Assessment & Plan Note (Signed)
 On atorvastatin  40 mg once daily Lipid profile reviewed

## 2024-07-09 NOTE — Assessment & Plan Note (Signed)
 Anxiety remains adequately controlled with alprazolam  1 mg every morning and 2 mg nightly.  PDMP reviewed and is appropriate.

## 2024-07-09 NOTE — Assessment & Plan Note (Signed)
 Physical exam as documented. Fasting blood tests today.

## 2024-07-09 NOTE — Progress Notes (Signed)
 Established Patient Office Visit  Subjective:  Patient ID: [name], female    DOB: [birth year]  Age: 73 y.o. MRN: [id number]  CC:  Chief Complaint  Patient presents with   Hypertension    FOLLOW UP   Anxiety    FOLLOW UP   Annual Exam    HPI [name] is a 72 y.o. female with past medical history of HTN, prediabetes, HLD and GAD who presents for f/u of her chronic medical conditions.  HTN: BP is well-controlled. Takes medications regularly. Patient denies headache, dizziness, chest pain, dyspnea or palpitations.  She takes atorvastatin  for HLD.  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  She takes meloxicam  7.5 mg BID for right hip pain, followed by orthopedic surgeon.  GAD: She takes Xanax  1 mg every morning and 2 mg every afternoon for anxiety, has taken Xanax  for many years.  Denies anhedonia, SI or HI currently.  Prediabetes: She has tolerated Ozempic  well. She has been able to lose about 80 lbs since 2023.  She follows low-carb diet.   Past Medical History:  Diagnosis Date   Anxiety    panic attack   Arthritis    Carpal tunnel syndrome on both sides    Complication of anesthesia    1st knee replacement slow to awaken, 2nd knee replacement - woke up had to have more anesthesia   High cholesterol    History of kidney stones    Hypertension    Knee joint replacement status    Shortness of breath dyspnea    with exertion    Past Surgical History:  Procedure Laterality Date   APPENDECTOMY     BREAST CYST EXCISION     BUNIONECTOMY  09/29/2011   RIGHT   CARPAL TUNNEL RELEASE  04/13/2012   rocedure: CARPAL TUNNEL RELEASE;  Surgeon: Taft FORBES Minerva, MD;  Location: [facility];  Service: Orthopedics;  Laterality: Right;   CARPAL TUNNEL RELEASE Left 11/26/2021   Procedure: CARPAL TUNNEL RELEASE;  Surgeon: Minerva Taft FORBES, MD;  Location: [facility];  Service: Orthopedics;  Laterality: Left;   COLONOSCOPY N/A 09/11/2015   Procedure: COLONOSCOPY;   Surgeon: Margo LITTIE Haddock, MD;  Location: [facility];  Service: Endoscopy;  Laterality: N/A;  1000   COLONOSCOPY N/A 05/21/2024   Procedure: COLONOSCOPY;  Surgeon: Cindie Carlin MARLA, DO;  Location: [facility];  Service: Endoscopy;  Laterality: N/A;  9:30 am, asa 2   hip repalcement Left 2021   JOINT REPLACEMENT Bilateral 07/25/2006   bilateral knees, [facility]; [facility]   KNEE SURGERY     LAMINECTOMY     ORIF HUMERUS FRACTURE Right 01/11/2016   Procedure: OPEN REDUCTION INTERNAL FIXATION (ORIF) RIGHT PROXIMAL HUMERUS FRACTURE;  Surgeon: Glendia Cordella Hutchinson, MD;  Location: [facility];  Service: Orthopedics;  Laterality: Right;    Family History  Problem Relation Age of Onset   Dementia Mother    Arthritis Mother    Hypertension Mother    Colon polyps Mother    Cancer Father        prostate, lung   COPD Father    Heart disease Father    Colon polyps Father    Congestive Heart Failure Father    Hyperlipidemia Sister    Colon polyps Sister    Hyperlipidemia Brother    Colon polyps Brother    Birth defects Son    Early death Son    Congestive Heart Failure Paternal Aunt  Cancer Maternal Grandmother        lung   Diabetes Maternal Grandfather    Heart attack Maternal Grandfather    Heart disease Paternal Grandmother    Stroke Paternal Grandmother    Congestive Heart Failure Paternal Grandmother    Cancer Paternal Grandfather        lung   Cancer Brother        liver   Diabetes Brother    Irritable bowel syndrome Daughter    Congestive Heart Failure Paternal Aunt    Anesthesia problems Neg Hx    Hypotension Neg Hx    Malignant hyperthermia Neg Hx    Pseudochol deficiency Neg Hx     Social History   Socioeconomic History   Marital status: Married    Spouse name: Not on file   Number of children: Not on file   Years of education: Not on file   Highest education level: Not on file  Occupational History   Not on file  Tobacco Use   Smoking status: Former    Current  packs/day: 0.00    Types: Cigarettes    Start date: 11/25/1980    Quit date: 11/26/2010    Years since quitting: 13.6   Smokeless tobacco: Never  Vaping Use   Vaping status: Never Used  Substance and Sexual Activity   Alcohol use: Yes    Alcohol/week: 2.0 standard drinks of alcohol    Types: 2 Glasses of wine per week    Comment: occasional wine   Drug use: No   Sexual activity: Not Currently    Birth control/protection: Post-menopausal  Other Topics Concern   Not on file  Social History Narrative   RAISED KID. THEN WORKED FOR 12 YRS. TROUBLE WITH HIPS AND KNEES TOOK HER OUT OF WORK DAUGHTER WORKS FOR [employer]. ANOTHER DAUGHTER STUDYING TO BE A NURSE.   Social Drivers of Health   Tobacco Use: Medium Risk (07/09/2024)   Patient History    Smoking Tobacco Use: Former    Smokeless Tobacco Use: Never    Passive Exposure: Not on file  Financial Resource Strain: Low Risk (06/07/2023)   Overall Financial Resource Strain (CARDIA)    Difficulty of Paying Living Expenses: Not hard at all  Food Insecurity: No Food Insecurity (06/13/2024)   Epic    Worried About Programme Researcher, Broadcasting/film/video in the Last Year: Never true    Ran Out of Food in the Last Year: Never true  Transportation Needs: No Transportation Needs (06/13/2024)   Epic    Lack of Transportation (Medical): No    Lack of Transportation (Non-Medical): No  Physical Activity: Insufficiently Active (06/13/2024)   Exercise Vital Sign    Days of Exercise per Week: 7 days    Minutes of Exercise per Session: 20 min  Stress: No Stress Concern Present (06/13/2024)   [employer] of Occupational Health - Occupational Stress Questionnaire    Feeling of Stress: Not at all  Social Connections: Socially Integrated (06/13/2024)   Social Connection and Isolation Panel    Frequency of Communication with Friends and Family: More than three times a week    Frequency of Social Gatherings with Friends and Family: More than three times a week     Attends Religious Services: More than 4 times per year    Active Member of [employer] or Organizations: Yes    Attends Engineer, Structural: More than 4 times per year    Marital Status: Married  Catering Manager Violence:  Not At Risk (06/13/2024)   Epic    Fear of Current or Ex-Partner: No    Emotionally Abused: No    Physically Abused: No    Sexually Abused: No  Depression (PHQ2-9): Medium Risk (07/09/2024)   Depression (PHQ2-9)    PHQ-2 Score: 10  Alcohol Screen: Low Risk (06/07/2023)   Alcohol Screen    Last Alcohol Screening Score (AUDIT): 0  Housing: Low Risk (06/13/2024)   Epic    Unable to Pay for Housing in the Last Year: No    Number of Times Moved in the Last Year: 0    Homeless in the Last Year: No  Utilities: Not At Risk (06/13/2024)   Epic    Threatened with loss of utilities: No  Health Literacy: Adequate Health Literacy (06/13/2024)   B1300 Health Literacy    Frequency of need for help with medical instructions: Never    Outpatient Medications Prior to Visit  Medication Sig Dispense Refill   ALPRAZolam  (XANAX ) 1 MG tablet TAKE 1 TABLET BY MOUTH IN THE MORNING AND 2 TABLETS DAILY AT BEDTIME FOR ANXIETY 90 tablet 3   meloxicam  (MOBIC ) 7.5 MG tablet TAKE 1 TABLET BY MOUTH TWICE A DAY 60 tablet 5   Multiple Vitamin (MULTIVITAMIN) capsule Take 1 capsule by mouth daily.     aspirin  EC 81 MG tablet Take 81 mg by mouth daily. Swallow whole.     atorvastatin  (LIPITOR) 40 MG tablet Take 1 tablet (40 mg total) by mouth daily. 90 tablet 3   lisinopril  (ZESTRIL ) 5 MG tablet Take 1 tablet (5 mg total) by mouth daily. 90 tablet 3   Semaglutide ,0.25 or 0.5MG /DOS, (OZEMPIC , 0.25 OR 0.5 MG/DOSE,) 2 MG/3ML SOPN INJECT 0.5 MG EACH WEEK SUBCUTANEOUSLY. 3 mL 2   No facility-administered medications prior to visit.    Allergies  Allergen Reactions   Gabapentin  Rash   Morphine  Itching and Rash    ROS Review of Systems  Constitutional:  Negative for chills and fever.   HENT:  Negative for congestion, sinus pressure, sinus pain and sore throat.   Eyes:  Negative for pain and discharge.  Respiratory:  Negative for cough and shortness of breath.   Cardiovascular:  Negative for chest pain and palpitations.  Gastrointestinal:  Negative for abdominal pain, diarrhea, nausea and vomiting.  Endocrine: Negative for polydipsia and polyuria.  Genitourinary:  Negative for dysuria and hematuria.  Musculoskeletal:  Positive for arthralgias. Negative for neck pain and neck stiffness.  Skin:  Negative for rash.  Neurological:  Negative for dizziness and weakness.  Psychiatric/Behavioral:  Negative for agitation and behavioral problems. The patient is nervous/anxious.       Objective:    Physical Exam Vitals reviewed.  Constitutional:      General: She is not in acute distress.    Appearance: She is not diaphoretic.  HENT:     Head: Normocephalic and atraumatic.     Nose: Nose normal. No congestion.     Mouth/Throat:     Mouth: Mucous membranes are moist.     Pharynx: No posterior oropharyngeal erythema.  Eyes:     General: No scleral icterus.    Extraocular Movements: Extraocular movements intact.  Cardiovascular:     Rate and Rhythm: Normal rate and regular rhythm.     Heart sounds: Normal heart sounds. No murmur heard. Pulmonary:     Breath sounds: Normal breath sounds. No wheezing or rales.  Abdominal:     Palpations: Abdomen is soft.  Tenderness: There is no abdominal tenderness.  Musculoskeletal:     Cervical back: Neck supple. No tenderness.     Right lower leg: No edema.     Left lower leg: No edema.  Skin:    General: Skin is warm.     Findings: No rash.  Neurological:     General: No focal deficit present.     Mental Status: She is alert and oriented to person, place, and time.     Cranial Nerves: No cranial nerve deficit.     Sensory: No sensory deficit.     Motor: No weakness.  Psychiatric:        Mood and Affect: Mood normal.         Behavior: Behavior normal.     BP 121/70   Pulse 75   Ht 5' 4 (1.626 m)   Wt 145 lb 6.4 oz (66 kg)   SpO2 98%   BMI 24.96 kg/m  Wt Readings from Last 3 Encounters:  07/09/24 145 lb 6.4 oz (66 kg)  06/13/24 146 lb 3.2 oz (66.3 kg)  05/21/24 149 lb (67.6 kg)    Lab Results  Component Value Date   TSH 3.250 09/04/2023   Lab Results  Component Value Date   WBC 5.3 09/04/2023   HGB 12.5 09/04/2023   HCT 36.9 09/04/2023   MCV 94 09/04/2023   PLT 235 09/04/2023   Lab Results  Component Value Date   NA 141 03/05/2024   K 4.8 03/05/2024   CO2 22 03/05/2024   GLUCOSE 86 03/05/2024   BUN 15 03/05/2024   CREATININE 0.99 03/05/2024   BILITOT 0.5 09/04/2023   ALKPHOS 90 09/04/2023   AST 31 09/04/2023   ALT 23 09/04/2023   PROT 6.6 09/04/2023   ALBUMIN 4.4 09/04/2023   CALCIUM  9.8 03/05/2024   ANIONGAP 10 11/24/2021   EGFR 61 03/05/2024   Lab Results  Component Value Date   CHOL 169 09/04/2023   Lab Results  Component Value Date   HDL 60 09/04/2023   Lab Results  Component Value Date   LDLCALC 85 09/04/2023   Lab Results  Component Value Date   TRIG 139 09/04/2023   Lab Results  Component Value Date   CHOLHDL 2.8 09/04/2023   Lab Results  Component Value Date   HGBA1C 5.3 03/05/2024      Assessment & Plan:   Problem List Items Addressed This Visit       Cardiovascular and Mediastinum   Essential hypertension   BP Readings from Last 1 Encounters:  07/09/24 121/70   Well-controlled with lisinopril  5 mg QD Counseled for compliance with the medications Advised DASH diet and moderate exercise/walking as tolerated  She takes aspirin  81 mg QD, but does not have any personal history of CAD, PAD or CVA - with shared decision making, advised to stop aspirin       Relevant Medications   atorvastatin  (LIPITOR) 40 MG tablet   lisinopril  (ZESTRIL ) 5 MG tablet   Other Relevant Orders   TSH   CMP14+EGFR   CBC with Differential/Platelet     Other    Generalized anxiety disorder (Chronic)   Anxiety remains adequately controlled with alprazolam  1 mg every morning and 2 mg nightly.  PDMP reviewed and is appropriate.      Hyperlipidemia   On atorvastatin  40 mg once daily Lipid profile reviewed      Relevant Medications   atorvastatin  (LIPITOR) 40 MG tablet   lisinopril  (ZESTRIL ) 5 MG tablet  Other Relevant Orders   Lipid panel   Prediabetes   Lab Results  Component Value Date   HGBA1C 5.3 03/05/2024   Advised to continue to follow DASH diet On Ozempic  0.5 mg qw - advised to take 0.25 mg qw now as her BMI is < 25 now      Relevant Orders   Hemoglobin A1c   CMP14+EGFR   Encounter for general adult medical examination with abnormal findings - Primary   Physical exam as documented. Fasting blood tests today.      Other Visit Diagnoses       Insulin resistance       Relevant Medications   Semaglutide ,0.25 or 0.5MG /DOS, (OZEMPIC , 0.25 OR 0.5 MG/DOSE,) 2 MG/3ML SOPN     Vitamin D  deficiency       Relevant Orders   VITAMIN D  25 Hydroxy (Vit-D Deficiency, Fractures)       Meds ordered this encounter  Medications   atorvastatin  (LIPITOR) 40 MG tablet    Sig: Take 1 tablet (40 mg total) by mouth daily.    Dispense:  90 tablet    Refill:  3   lisinopril  (ZESTRIL ) 5 MG tablet    Sig: Take 1 tablet (5 mg total) by mouth daily.    Dispense:  90 tablet    Refill:  3   Semaglutide ,0.25 or 0.5MG /DOS, (OZEMPIC , 0.25 OR 0.5 MG/DOSE,) 2 MG/3ML SOPN    Sig: Inject 0.5 mg into the skin every 7 (seven) days.    Dispense:  3 mL    Refill:  5    Follow-up: Return in about 4 months (around 11/07/2024) for HTN and GAD.    Suzzane MARLA Blanch, MD

## 2024-07-09 NOTE — Assessment & Plan Note (Addendum)
 Lab Results  Component Value Date   HGBA1C 5.3 03/05/2024   Advised to continue to follow DASH diet On Ozempic  0.5 mg qw - advised to take 0.25 mg qw now as her BMI is < 25 now

## 2024-07-09 NOTE — Assessment & Plan Note (Addendum)
 BP Readings from Last 1 Encounters:  07/09/24 121/70   Well-controlled with lisinopril  5 mg QD Counseled for compliance with the medications Advised DASH diet and moderate exercise/walking as tolerated  She takes aspirin  81 mg QD, but does not have any personal history of CAD, PAD or CVA - with shared decision making, advised to stop aspirin 

## 2024-07-09 NOTE — Patient Instructions (Signed)
Please continue to take medications as prescribed.  Please continue to follow low carb diet and perform moderate exercise/walking as tolerated.  Please get fasting blood tests done before the next visit.

## 2024-07-15 ENCOUNTER — Ambulatory Visit: Payer: Self-pay | Admitting: Internal Medicine

## 2024-07-17 NOTE — Progress Notes (Signed)
 NIC'd colonoscopy for 5 years

## 2024-08-09 ENCOUNTER — Ambulatory Visit (HOSPITAL_COMMUNITY)
Admission: RE | Admit: 2024-08-09 | Discharge: 2024-08-09 | Disposition: A | Discharge status: Home / Self Care | Source: Ambulatory Visit | Attending: Internal Medicine | Admitting: Internal Medicine

## 2024-08-09 DIAGNOSIS — Z1231 Encounter for screening mammogram for malignant neoplasm of breast: Secondary | ICD-10-CM | POA: Diagnosis present

## 2024-08-09 DIAGNOSIS — Z780 Asymptomatic menopausal state: Secondary | ICD-10-CM | POA: Insufficient documentation

## 2024-08-09 DIAGNOSIS — Z13820 Encounter for screening for osteoporosis: Secondary | ICD-10-CM | POA: Diagnosis present

## 2024-08-12 ENCOUNTER — Ambulatory Visit: Payer: Self-pay | Admitting: Internal Medicine

## 2024-08-20 ENCOUNTER — Telehealth: Payer: Self-pay | Admitting: Pharmacy Technician

## 2024-08-20 ENCOUNTER — Other Ambulatory Visit (HOSPITAL_COMMUNITY): Payer: Self-pay

## 2024-08-20 NOTE — Telephone Encounter (Signed)
 Pharmacy Patient Advocate Encounter   Received notification from [facility] KEY that prior authorization for Ozempic  0.5mg  is required/requested.   Insurance verification completed.   The patient is insured through [facility].   Per test claim: Refill too soon. PA is not needed at this time. Medication was filled 08/05/24. Next eligible fill date is 08/26/24.  [id number]- Will hpld Key open for now.   Liraglutide, Mounjaro, or Trulicity is preferred.   I see the pt has been on the Ozempic  for quite a while now, but last time the 2mg  was filled. Can you confirm if the dose has changed?

## 2024-08-21 NOTE — Telephone Encounter (Signed)
 Per last office visit from 07/09/24, she was advised to stop her current dose of 0.5 and go back down to 0.25 mg since her BMI was below 25.  [name]

## 2024-08-23 ENCOUNTER — Other Ambulatory Visit (HOSPITAL_COMMUNITY): Payer: Self-pay

## 2024-08-24 ENCOUNTER — Other Ambulatory Visit: Payer: Self-pay | Admitting: Internal Medicine

## 2024-08-24 DIAGNOSIS — F419 Anxiety disorder, unspecified: Secondary | ICD-10-CM

## 2024-11-07 ENCOUNTER — Ambulatory Visit: Payer: Self-pay | Admitting: Internal Medicine

## 2025-06-17 ENCOUNTER — Ambulatory Visit
